# Patient Record
Sex: Female | Born: 1950
Health system: Southern US, Community
[De-identification: ages and names within clinical notes are randomized; demographics above are authoritative.]

## PROBLEM LIST (undated history)

## (undated) DIAGNOSIS — Z87442 Personal history of urinary calculi: Secondary | ICD-10-CM

## (undated) DIAGNOSIS — I10 Essential (primary) hypertension: Secondary | ICD-10-CM

## (undated) DIAGNOSIS — G43909 Migraine, unspecified, not intractable, without status migrainosus: Secondary | ICD-10-CM

## (undated) DIAGNOSIS — K8020 Calculus of gallbladder without cholecystitis without obstruction: Secondary | ICD-10-CM

## (undated) DIAGNOSIS — M1990 Unspecified osteoarthritis, unspecified site: Secondary | ICD-10-CM

## (undated) DIAGNOSIS — J189 Pneumonia, unspecified organism: Secondary | ICD-10-CM

## (undated) HISTORY — PX: TUBAL LIGATION: SHX77

## (undated) HISTORY — PX: KIDNEY STONE SURGERY: SHX686

## (undated) HISTORY — DX: Essential (primary) hypertension: I10

## (undated) HISTORY — PX: OTHER SURGICAL HISTORY: SHX169

---

## 2007-07-30 ENCOUNTER — Emergency Department (HOSPITAL_COMMUNITY): Admission: EM | Admit: 2007-07-30 | Discharge: 2007-07-30 | Payer: Self-pay | Admitting: Emergency Medicine

## 2008-02-11 ENCOUNTER — Emergency Department (HOSPITAL_COMMUNITY): Admission: EM | Admit: 2008-02-11 | Discharge: 2008-02-11 | Payer: Self-pay | Admitting: Family Medicine

## 2008-02-11 LAB — CONVERTED CEMR LAB
Bilirubin Urine: NEGATIVE
Nitrite: NEGATIVE
Protein, ur: 100 mg/dL
Specific Gravity, Urine: 1.01
Urobilinogen, UA: 0.2

## 2008-02-18 LAB — CONVERTED CEMR LAB
BUN: 10 mg/dL
Glucose, Bld: 92 mg/dL

## 2008-02-21 ENCOUNTER — Ambulatory Visit: Payer: Self-pay | Admitting: Nurse Practitioner

## 2008-02-21 DIAGNOSIS — R319 Hematuria, unspecified: Secondary | ICD-10-CM | POA: Insufficient documentation

## 2008-02-21 LAB — CONVERTED CEMR LAB
Basophils Absolute: 0.2 10*3/uL
Eosinophils Absolute: 0.5 10*3/uL
Eosinophils Relative: 5 %
Glucose, Urine, Semiquant: NEGATIVE
HCT: 39.2 %
Hemoglobin: 12.9 g/dL
Lymphocytes Relative: 26 %
MCV: 85.1 fL
Monocytes Absolute: 0.7 10*3/uL
Nitrite: NEGATIVE
Platelets: 414 10*3/uL
Protein, U semiquant: 30
RDW: 13.2 %
Urobilinogen, UA: 0.2
WBC Urine, dipstick: NEGATIVE

## 2008-02-22 ENCOUNTER — Encounter (INDEPENDENT_AMBULATORY_CARE_PROVIDER_SITE_OTHER): Payer: Self-pay | Admitting: Nurse Practitioner

## 2008-02-22 ENCOUNTER — Ambulatory Visit: Payer: Self-pay | Admitting: *Deleted

## 2008-02-23 ENCOUNTER — Ambulatory Visit (HOSPITAL_COMMUNITY): Admission: RE | Admit: 2008-02-23 | Discharge: 2008-02-23 | Payer: Self-pay | Admitting: Internal Medicine

## 2008-03-19 ENCOUNTER — Ambulatory Visit: Payer: Self-pay | Admitting: Nurse Practitioner

## 2008-03-19 DIAGNOSIS — N200 Calculus of kidney: Secondary | ICD-10-CM | POA: Insufficient documentation

## 2008-03-19 DIAGNOSIS — N959 Unspecified menopausal and perimenopausal disorder: Secondary | ICD-10-CM | POA: Insufficient documentation

## 2008-03-19 LAB — CONVERTED CEMR LAB
ALT: 11 units/L (ref 0–35)
BUN: 19 mg/dL (ref 6–23)
Bilirubin Urine: NEGATIVE
CO2: 25 meq/L (ref 19–32)
Calcium: 9.9 mg/dL (ref 8.4–10.5)
Chlamydia, DNA Probe: NEGATIVE
Chloride: 104 meq/L (ref 96–112)
Creatinine, Ser: 0.79 mg/dL (ref 0.40–1.20)
Eosinophils Relative: 4 % (ref 0–5)
GC Probe Amp, Genital: NEGATIVE
Glucose, Bld: 73 mg/dL (ref 70–99)
Glucose, Urine, Semiquant: NEGATIVE
HCT: 38.7 % (ref 36.0–46.0)
Hemoglobin: 12.8 g/dL (ref 12.0–15.0)
Ketones, urine, test strip: NEGATIVE
Lymphocytes Relative: 27 % (ref 12–46)
Lymphs Abs: 2.6 10*3/uL (ref 0.7–4.0)
Monocytes Absolute: 0.6 10*3/uL (ref 0.1–1.0)
Monocytes Relative: 6 % (ref 3–12)
Neutro Abs: 6 10*3/uL (ref 1.7–7.7)
Nitrite: NEGATIVE
Protein, U semiquant: 30
RBC: 4.68 M/uL (ref 3.87–5.11)
TSH: 1.643 microintl units/mL (ref 0.350–5.50)
Total Bilirubin: 0.3 mg/dL (ref 0.3–1.2)
Uric Acid, Serum: 3.8 mg/dL (ref 2.4–7.0)

## 2008-03-20 LAB — CONVERTED CEMR LAB: Pap Smear: NEGATIVE

## 2008-03-22 ENCOUNTER — Ambulatory Visit (HOSPITAL_COMMUNITY): Admission: RE | Admit: 2008-03-22 | Discharge: 2008-03-22 | Payer: Self-pay | Admitting: Internal Medicine

## 2008-03-23 ENCOUNTER — Encounter (INDEPENDENT_AMBULATORY_CARE_PROVIDER_SITE_OTHER): Payer: Self-pay | Admitting: Nurse Practitioner

## 2008-03-23 LAB — CONVERTED CEMR LAB

## 2008-03-27 ENCOUNTER — Encounter (INDEPENDENT_AMBULATORY_CARE_PROVIDER_SITE_OTHER): Payer: Self-pay | Admitting: Nurse Practitioner

## 2008-04-20 ENCOUNTER — Ambulatory Visit: Payer: Self-pay | Admitting: Nurse Practitioner

## 2008-04-20 DIAGNOSIS — N63 Unspecified lump in unspecified breast: Secondary | ICD-10-CM | POA: Insufficient documentation

## 2008-04-20 DIAGNOSIS — M949 Disorder of cartilage, unspecified: Secondary | ICD-10-CM

## 2008-04-20 DIAGNOSIS — M899 Disorder of bone, unspecified: Secondary | ICD-10-CM | POA: Insufficient documentation

## 2008-04-25 ENCOUNTER — Encounter: Admission: RE | Admit: 2008-04-25 | Discharge: 2008-04-25 | Payer: Self-pay | Admitting: Internal Medicine

## 2009-05-22 ENCOUNTER — Ambulatory Visit: Payer: Self-pay | Admitting: Nurse Practitioner

## 2009-05-22 DIAGNOSIS — F17200 Nicotine dependence, unspecified, uncomplicated: Secondary | ICD-10-CM

## 2009-05-22 DIAGNOSIS — Z87891 Personal history of nicotine dependence: Secondary | ICD-10-CM | POA: Insufficient documentation

## 2009-08-29 ENCOUNTER — Encounter (INDEPENDENT_AMBULATORY_CARE_PROVIDER_SITE_OTHER): Payer: Self-pay | Admitting: Nurse Practitioner

## 2009-08-29 ENCOUNTER — Ambulatory Visit: Payer: Self-pay | Admitting: Nurse Practitioner

## 2009-08-29 LAB — CONVERTED CEMR LAB
BUN: 15 mg/dL (ref 6–23)
Bilirubin Urine: NEGATIVE
CO2: 27 meq/L (ref 19–32)
Calcium: 9.6 mg/dL (ref 8.4–10.5)
Chloride: 103 meq/L (ref 96–112)
Creatinine, Ser: 0.89 mg/dL (ref 0.40–1.20)
Eosinophils Absolute: 0.4 10*3/uL (ref 0.0–0.7)
Eosinophils Relative: 4 % (ref 0–5)
HCT: 37.6 % (ref 36.0–46.0)
Lymphs Abs: 2.2 10*3/uL (ref 0.7–4.0)
MCHC: 32.7 g/dL (ref 30.0–36.0)
MCV: 83.2 fL (ref 78.0–100.0)
Microalb, Ur: 0.98 mg/dL (ref 0.00–1.89)
Monocytes Relative: 6 % (ref 3–12)
Platelets: 388 10*3/uL (ref 150–400)
RBC: 4.52 M/uL (ref 3.87–5.11)
TSH: 1.178 microintl units/mL (ref 0.350–4.500)
Urobilinogen, UA: 1
WBC: 9.9 10*3/uL (ref 4.0–10.5)
pH: 7

## 2009-09-04 ENCOUNTER — Encounter (INDEPENDENT_AMBULATORY_CARE_PROVIDER_SITE_OTHER): Payer: Self-pay | Admitting: Nurse Practitioner

## 2009-09-06 ENCOUNTER — Ambulatory Visit (HOSPITAL_COMMUNITY): Admission: RE | Admit: 2009-09-06 | Discharge: 2009-09-06 | Payer: Self-pay | Admitting: Internal Medicine

## 2009-09-12 ENCOUNTER — Ambulatory Visit: Payer: Self-pay | Admitting: Nurse Practitioner

## 2009-09-13 ENCOUNTER — Encounter (INDEPENDENT_AMBULATORY_CARE_PROVIDER_SITE_OTHER): Payer: Self-pay | Admitting: Nurse Practitioner

## 2009-09-13 DIAGNOSIS — E7800 Pure hypercholesterolemia, unspecified: Secondary | ICD-10-CM | POA: Insufficient documentation

## 2010-09-09 ENCOUNTER — Ambulatory Visit (HOSPITAL_COMMUNITY): Admission: RE | Admit: 2010-09-09 | Discharge: 2010-09-09 | Payer: Self-pay | Admitting: Internal Medicine

## 2011-01-18 ENCOUNTER — Encounter: Payer: Self-pay | Admitting: Internal Medicine

## 2011-09-02 ENCOUNTER — Other Ambulatory Visit (HOSPITAL_COMMUNITY): Payer: Self-pay | Admitting: Family Medicine

## 2011-09-02 DIAGNOSIS — Z1231 Encounter for screening mammogram for malignant neoplasm of breast: Secondary | ICD-10-CM

## 2011-09-14 ENCOUNTER — Ambulatory Visit (HOSPITAL_COMMUNITY)
Admission: RE | Admit: 2011-09-14 | Discharge: 2011-09-14 | Disposition: A | Discharge status: Home / Self Care | Payer: Self-pay | Source: Ambulatory Visit | Attending: Family Medicine | Admitting: Family Medicine

## 2011-09-14 DIAGNOSIS — Z1231 Encounter for screening mammogram for malignant neoplasm of breast: Secondary | ICD-10-CM | POA: Insufficient documentation

## 2011-09-18 LAB — POCT I-STAT CREATININE: Creatinine, Ser: 0.9

## 2011-09-18 LAB — DIFFERENTIAL
Basophils Relative: 2 — ABNORMAL HIGH
Eosinophils Absolute: 0.5
Eosinophils Relative: 5
Lymphs Abs: 2.7
Monocytes Absolute: 0.7
Monocytes Relative: 7

## 2011-09-18 LAB — POCT URINALYSIS DIP (DEVICE)
Glucose, UA: NEGATIVE
Specific Gravity, Urine: 1.01
Urobilinogen, UA: 0.2

## 2011-09-18 LAB — I-STAT 8, (EC8 V) (CONVERTED LAB)
BUN: 10
Bicarbonate: 26.5 — ABNORMAL HIGH
Glucose, Bld: 92
TCO2: 28
pH, Ven: 7.403 — ABNORMAL HIGH

## 2011-09-18 LAB — CBC
HCT: 39.2
Hemoglobin: 12.9
MCHC: 33
MCV: 85.1
RBC: 4.6

## 2011-10-12 LAB — URINALYSIS, ROUTINE W REFLEX MICROSCOPIC
Bilirubin Urine: NEGATIVE
Ketones, ur: 15 — AB
Nitrite: NEGATIVE
Protein, ur: 100 — AB
Specific Gravity, Urine: 1.018
Urobilinogen, UA: 0.2

## 2011-10-12 LAB — DIFFERENTIAL
Basophils Absolute: 0.4 — ABNORMAL HIGH
Basophils Relative: 2 — ABNORMAL HIGH
Eosinophils Relative: 6 — ABNORMAL HIGH
Lymphocytes Relative: 11 — ABNORMAL LOW
Monocytes Absolute: 0.8 — ABNORMAL HIGH

## 2011-10-12 LAB — CBC
HCT: 47.3 — ABNORMAL HIGH
MCV: 85.6
Platelets: 467 — ABNORMAL HIGH
RBC: 5.53 — ABNORMAL HIGH
WBC: 17.3 — ABNORMAL HIGH

## 2011-10-12 LAB — COMPREHENSIVE METABOLIC PANEL
AST: 16
Albumin: 4.2
Alkaline Phosphatase: 66
BUN: 10
CO2: 28
Chloride: 98
Creatinine, Ser: 0.79
GFR calc Af Amer: >60
GFR calc non Af Amer: >60
Potassium: 3.5
Total Bilirubin: 0.8

## 2011-10-12 LAB — LIPASE, BLOOD: Lipase: 18

## 2012-10-27 ENCOUNTER — Encounter: Payer: Self-pay | Admitting: *Deleted

## 2013-08-10 ENCOUNTER — Emergency Department (HOSPITAL_COMMUNITY)
Admission: EM | Admit: 2013-08-10 | Discharge: 2013-08-10 | Disposition: A | Discharge status: Home / Self Care | Payer: BC Managed Care – PPO | Attending: Emergency Medicine | Admitting: Emergency Medicine

## 2013-08-10 ENCOUNTER — Encounter (HOSPITAL_COMMUNITY): Payer: Self-pay

## 2013-08-10 DIAGNOSIS — Z8719 Personal history of other diseases of the digestive system: Secondary | ICD-10-CM | POA: Insufficient documentation

## 2013-08-10 DIAGNOSIS — F17200 Nicotine dependence, unspecified, uncomplicated: Secondary | ICD-10-CM | POA: Insufficient documentation

## 2013-08-10 DIAGNOSIS — R200 Anesthesia of skin: Secondary | ICD-10-CM

## 2013-08-10 DIAGNOSIS — R209 Unspecified disturbances of skin sensation: Secondary | ICD-10-CM | POA: Insufficient documentation

## 2013-08-10 HISTORY — DX: Calculus of gallbladder without cholecystitis without obstruction: K80.20

## 2013-08-10 LAB — POCT I-STAT, CHEM 8
BUN: 8 mg/dL (ref 6–23)
Calcium, Ion: 1.07 mmol/L — ABNORMAL LOW (ref 1.13–1.30)
Chloride: 108 mEq/L (ref 96–112)
Creatinine, Ser: 0.9 mg/dL (ref 0.50–1.10)
Glucose, Bld: 89 mg/dL (ref 70–99)
TCO2: 24 mmol/L (ref 0–100)

## 2013-08-10 MED ORDER — ASPIRIN 81 MG PO CHEW: 81.0000 mg | CHEWABLE_TABLET | Freq: Every day | ORAL | Status: DC

## 2013-08-10 NOTE — ED Notes (Signed)
Patient presents to ED via POV. Pt c/o of right sided "numbness" that comes and goes for approx 1 week. Pt states that her right arm and leg go numb for approx 20 minutes and then the symptoms resolve. Pt states that she she has not experienced the symptoms everyday. Pt A&Ox4 upon arrival to ED. No facial droop noted. No extremity drift noted. Speech clear. Pt not experiencing any symptoms at this time.

## 2013-08-10 NOTE — ED Notes (Signed)
Janet Client, PA back in to speak with the patient.

## 2013-08-10 NOTE — ED Provider Notes (Signed)
Medical screening examination/treatment/procedure(s) were performed by non-physician practitioner and as supervising physician I was immediately available for consultation/collaboration.  Olivia Mackie, MD 08/10/13 [phone]

## 2013-08-10 NOTE — ED Provider Notes (Signed)
CSN: [id number]     Arrival date & time 08/10/13  0604 History     First MD Initiated Contact with Patient 08/10/13 [phone]     Chief Complaint  Patient presents with  . Numbness   (Consider location/radiation/quality/duration/timing/severity/associated sxs/prior Treatment) HPI Comments: Patient is a 62 year old female who presents today with one week of intermittent numbness to her right upper and lower extremity. She reports that initially when it began it lasted about 30 minutes at a time, but yesterday it lasted nearly all day prompting her to come to the emergency department this morning. She believes that stress "doing too much" make her numbness worse. The numbness is self-limited and nothing appears to make the numbness better. These episodes are not associated with weakness, shortness of breath, diaphoresis, chest pain, headache. She has never had numbness like this in the past.  The history is provided by the patient. No language interpreter was used.    Past Medical History  Diagnosis Date  . Gallstones    History reviewed. No pertinent past surgical history. No family history on file. History  Substance Use Topics  . Smoking status: Current Every Day Smoker -- 1.00 packs/day    Types: Cigarettes  . Smokeless tobacco: Not on file  . Alcohol Use: No   OB History   Grav Para Term Preterm Abortions TAB SAB Ect Mult Living                 Review of Systems  Constitutional: Negative for fever and chills.  Respiratory: Negative for shortness of breath.   Cardiovascular: Negative for chest pain.  Neurological: Positive for numbness. Negative for syncope, speech difficulty, weakness and headaches.  All other systems reviewed and are negative.    Allergies  Review of patient's allergies indicates no known allergies.  Home Medications  No current outpatient prescriptions on file. BP 144/80  Pulse 75  Temp(Src) 98.1 F (36.7 C) (Oral)  Resp 18  Ht 5\' 5"  (1.651 m)   Wt 130 lb (58.968 kg)  BMI 21.63 kg/m2  SpO2 100% Physical Exam  Nursing note and vitals reviewed. Constitutional: She is oriented to person, place, and time. She appears well-developed and well-nourished. No distress.  HENT:  Head: Normocephalic and atraumatic.  Right Ear: External ear normal.  Left Ear: External ear normal.  Nose: Nose normal.  Mouth/Throat: Oropharynx is clear and moist.  Eyes: Conjunctivae and EOM are normal. Pupils are equal, round, and reactive to light.  No facial droop  Neck: Normal range of motion.  Cardiovascular: Normal rate, regular rhythm, normal heart sounds, intact distal pulses and normal pulses.   Pulmonary/Chest: Effort normal and breath sounds normal. No stridor. No respiratory distress. She has no wheezes. She has no rales.  Abdominal: Soft. She exhibits no distension.  Musculoskeletal: Normal range of motion.  Neurological: She is alert and oriented to person, place, and time. She has normal strength and normal reflexes. No sensory deficit. Coordination and gait normal.  Heel shin toe normal. Finger-nose-finger normal. Strength 5/5 in all extremities.  Skin: Skin is warm and dry. She is not diaphoretic. No erythema.  Psychiatric: She has a normal mood and affect. Her behavior is normal.    ED Course   Procedures (including critical care time)  Labs Reviewed  POCT I-STAT, CHEM 8 - Abnormal; Notable for the following:    Calcium, Ion 1.07 (*)    All other components within normal limits   No results found. 1. Numbness  MDM  Patient with intermittent right-sided numbness for the past week. Neuro exam is within normal limits, no focal deficits. Patient is able to ambulate without difficulty. Patient is asymptomatic at this time. Plan is for patient to begin a daily ASA and follow up with neurology. Discussed this case with Dr. Norlene Campbell agrees with plan. Return instructions given. Vital signs stable for discharge. Patient / Family / Caregiver  informed of clinical course, understand medical decision-making process, and agree with plan.   Mora Bellman, PA-C 08/10/13 [phone]

## 2013-08-22 ENCOUNTER — Encounter: Payer: Self-pay | Admitting: Neurology

## 2013-08-22 ENCOUNTER — Ambulatory Visit (INDEPENDENT_AMBULATORY_CARE_PROVIDER_SITE_OTHER): Payer: BC Managed Care – PPO | Admitting: Neurology

## 2013-08-22 VITALS — BP 120/75 | HR 73 | Ht 65.0 in | Wt 117.0 lb

## 2013-08-22 DIAGNOSIS — R29818 Other symptoms and signs involving the nervous system: Secondary | ICD-10-CM

## 2013-08-22 NOTE — Patient Instructions (Signed)
Overall you are doing fairly well but I do want to suggest a few things today:   Remember to drink plenty of fluid, eat healthy meals and do not skip any meals. Try to eat protein with a every meal and eat a healthy snack such as fruit or nuts in between meals. Try to keep a regular sleep-wake schedule and try to exercise daily, particularly in the form of walking, 20-30 minutes a day, if you can.   As far as your medications are concerned, I would like to suggest you continue on the aspirin 81mg  daily  As far as diagnostic testing: I would like to get a MRI of the brain to check for a stroke  I would like to see you back in 4 to 6 months, sooner if we need to. Please call us with any interim questions, concerns, problems, updates or refill requests.   Please also call us for any test results so we can go over those with you on the phone.  My clinical assistant and will answer any of your questions and relay your messages to me and also relay most of my messages to you.   Our phone number is [phone]. We also have an after hours call service for urgent matters and there is a physician on-call for urgent questions. For any emergencies you know to call 911 or go to the nearest emergency room

## 2013-08-22 NOTE — Progress Notes (Signed)
[facility]  Provider:  Dr Hosie Poisson Referring Provider: Olivia Mackie, MD Primary Care Physician:  No PCP Per Patient  CC: right sided numbness  HPI:  Janet Sherman is a 62 y.o. female here as a referral from Dr. Norlene Campbell for evaluation of right sided numbness  She reports symptoms started around one month ago. At that time she noted the acute onset of numbness and pins and needles type sensation around her lips on the right side and also in her fingers, this eventually spread to involve her feet and the whole right side. As the progression also noted difficulty holding onto things with the right hand, some weakness in the right lower extremity and difficulty walking. She treated these symptoms to being stressed and annoyed as she reports her family was upsetting her. She eventually went to the event that point the symptoms had mostly resolved. She continues to have some right-sided numbness and paresthesias but the weakness has resolved. She has no prior history of this. Denies any difficulty walking or not. No prior history of stroke or TIA. In the ER started on aspirin 81 mg daily.  Denies any DM, HTN, HLD. No EtOH, smokes cigarettes. Denies any history of vision change painful vision loss.  Review of Systems: Out of a complete 14 system review, the patient complains of only the following symptoms, and all other reviewed systems are negative. Positive for numbness and weakness on the right side  History   Social History  . Marital Status: Married    Spouse Name: N/A    Number of Children: 5  . Years of Education: 9   Occupational History  .      [address]   Social History Main Topics  . Smoking status: Current Every Day Smoker -- 10.00 packs/day    Types: Cigarettes  . Smokeless tobacco: Never Used  . Alcohol Use: No  . Drug Use: No  . Sexual Activity: Not on file   Other Topics Concern  . Not on file   Social History Narrative   Patient  consumes no caffeine, is right handed.    Family History  Problem Relation Age of Onset  . Hypertension Mother   . Cancer Father     lung  . Hypertension Sister   . Hypertension Brother     Past Medical History  Diagnosis Date  . Gallstones     Past Surgical History  Procedure Laterality Date  . Gallstones      15 years ago    Current Outpatient Prescriptions  Medication Sig Dispense Refill  . aspirin 81 MG chewable tablet Chew 1 tablet (81 mg total) by mouth daily.  30 tablet  0  . CALCIUM PO Take 1 tablet by mouth daily.      Marland Kitchen VITAMIN E PO Take 1 capsule by mouth daily.       No current facility-administered medications for this visit.    Allergies as of 08/22/2013  . (No Known Allergies)    Vitals: BP 120/75  Pulse 73  Ht 5\' 5"  (1.651 m)  Wt 117 lb (53.071 kg)  BMI 19.47 kg/m2 Last Weight:  Wt Readings from Last 1 Encounters:  08/22/13 117 lb (53.071 kg)   Last Height:   Ht Readings from Last 1 Encounters:  08/22/13 5\' 5"  (1.651 m)     Physical exam: Exam: Gen: NAD, conversant Eyes: anicteric sclerae, moist conjunctivae HENT: Atraumati Neck: Trachea midline; supple,  Lungs: CTA, no wheezing, rales, rhonic  CV: RRR, no MRG Abdomen: Soft, non-tender;  Extremities: No peripheral edema  Skin: Normal temperature, no rash,  Psych: Appropriate affect, pleasant  Neuro: MS: AA&Ox3, appropriately interactive, normal affect   Speech: fluent w/o paraphasic error  Memory: good recent and remote recall  CN: PERRL, EOMI no nystagmus, no ptosis, sensation intact to LT V1-V3 bilat, face symmetric, no weakness, hearing grossly intact, palate elevates symmetrically, shoulder shrug 5/5 bilat,  tongue protrudes midline, no fasiculations noted.  Motor: normal bulk and tone Strength: 5/5  In all extremities  Coord: rapid alternating and point-to-point (FNF, HTS) movements intact.  Reflexes: symmetrical, bilat downgoing  toes  Sens: Light touch intact in all extremities, though light touch of the right hand trigger sensation or paresthesias. Intact pinprick temperature vibration proprioception in all service. 2. tactile discrimination intact.  Gait: posture, stance, stride and arm-swing normal. Tandem gait intact.Romberg absent.   Assessment:  After physical and neurologic examination, review of laboratory studies, imaging, neurophysiology testing and pre-existing records, assessment will be reviewed on the problem list.  Plan:  Treatment plan and additional workup will be reviewed under Problem List.  [name] is a pleasant 62 year old woman presents for initial evaluation of episode of hemisensory loss with mild weakness and paresthesias. The sensory loss and paresthesias continue the weakness has resolved per the patient. The patients clinical description of the events and history are concerning for a stroke, most likely a small lacunar infarct based on symptoms. This concern was discussed with the patient, who expresses understanding. We discussed different diagnostic and therapeutic options. Will order brain MRI to check for stroke. Instructed patient to continue on aspirin 81 mg daily. Pending results of MRI will consider further stroke workup. Can consider addition of Lyrica or Neurontin for symptomatic relief of paresthesias if patient interested in the future.  1)Hemisensory loss: r/o stroke  -continue ASA 81mg  daily -MRI of brain -if + for stroke will check 2D echo, lipids, HbA1c, consider starting statin -follow up once MRI completed

## 2013-08-24 ENCOUNTER — Ambulatory Visit (INDEPENDENT_AMBULATORY_CARE_PROVIDER_SITE_OTHER): Payer: BC Managed Care – PPO

## 2013-08-24 DIAGNOSIS — R29818 Other symptoms and signs involving the nervous system: Secondary | ICD-10-CM

## 2013-08-29 ENCOUNTER — Telehealth: Payer: Self-pay

## 2013-08-29 NOTE — Telephone Encounter (Signed)
Message copied by [facility] on Tue Aug 29, 2013  1:58 PM ------      Message from: Ramond Marrow      Created: Tue Aug 29, 2013 10:29 AM       Please let her know the MRI was normal. Thanks. ------

## 2013-08-29 NOTE — Telephone Encounter (Signed)
I called [employer] and spoke with her husband. I let him know that the results of the MRI were normal.

## 2013-09-06 ENCOUNTER — Telehealth: Payer: Self-pay | Admitting: Neurology

## 2013-09-06 ENCOUNTER — Other Ambulatory Visit: Payer: Self-pay | Admitting: Neurology

## 2013-09-06 MED ORDER — PREGABALIN 75 MG PO CAPS: 75.0000 mg | ORAL_CAPSULE | Freq: Two times a day (BID) | ORAL | Status: DC

## 2013-09-06 NOTE — Telephone Encounter (Signed)
I called patient and let her know her MRI was normal. She states that she has numbness on right side from lip down to toes. Patient stated that it feels like she has a blister and numbness on the inside but you can't the blister. Patient is wondering if you can order her something that can help with this numbness. She needs a call back before 2:00 p.m. on Thursday. Her phone number is: [phone].   Thank you.

## 2013-09-06 NOTE — Telephone Encounter (Signed)
Called patient. Started Lyrica 75mg  bid

## 2013-09-06 NOTE — Telephone Encounter (Signed)
Mrs. Janet Sherman, looks like you gave patient husband MRI results, but now she is calling wanting the results. Can you please call patient and talk with her?

## 2013-12-25 ENCOUNTER — Encounter (INDEPENDENT_AMBULATORY_CARE_PROVIDER_SITE_OTHER): Payer: Self-pay

## 2013-12-25 ENCOUNTER — Encounter: Payer: Self-pay | Admitting: Neurology

## 2013-12-25 ENCOUNTER — Ambulatory Visit (INDEPENDENT_AMBULATORY_CARE_PROVIDER_SITE_OTHER): Payer: BC Managed Care – PPO | Admitting: Neurology

## 2013-12-25 VITALS — BP 132/79 | HR 86 | Ht 66.0 in | Wt 123.0 lb

## 2013-12-25 DIAGNOSIS — R209 Unspecified disturbances of skin sensation: Secondary | ICD-10-CM

## 2013-12-25 DIAGNOSIS — R202 Paresthesia of skin: Secondary | ICD-10-CM

## 2013-12-25 NOTE — Progress Notes (Signed)
[facility]  Provider:  Dr Hosie Poisson Referring Provider: No ref. provider found Primary Care Physician:  No PCP Per Patient  CC: right sided numbness  HPI:  Janet Sherman is a 62 y.o. female here as a follow up for evaluation of right sided numbness. Since last visit (07/2013) she has had a brain MRI which was unremarkable. She was started on Lyrica 75mg  twice a day for symptomatic relief.   Notes symptoms are slowly improving. Continues to have mild paresthesias of right side which come and go. No weakness, no vision changes. She continues to feel the symptoms are triggered by stress/anxiety.    Initial visit 07/2013: She reports symptoms started around one month ago. At that time she noted the acute onset of numbness and pins and needles type sensation around her lips on the right side and also in her fingers, this eventually spread to involve her feet and the whole right side. As the progression also noted difficulty holding onto things with the right hand, some weakness in the right lower extremity and difficulty walking. She treated these symptoms to being stressed and annoyed as she reports her family was upsetting her. She eventually went to the event that point the symptoms had mostly resolved. She continues to have some right-sided numbness and paresthesias but the weakness has resolved. She has no prior history of this. Denies any difficulty walking or not. No prior history of stroke or TIA. In the ER started on aspirin 81 mg daily.  Denies any DM, HTN, HLD. No EtOH, smokes cigarettes. Denies any history of vision change painful vision loss.  Review of Systems: Out of a complete 14 system review, the patient complains of only the following symptoms, and all other reviewed systems are negative. Positive for paresthesias  History   Social History  . Marital Status: Married    Spouse Name: Janet Sherman    Number of Children: 5  . Years of Education: 10   Occupational  History  .      [address]   Social History Main Topics  . Smoking status: Current Every Day Smoker -- 10.00 packs/day    Types: Cigarettes  . Smokeless tobacco: Never Used  . Alcohol Use: No  . Drug Use: No  . Sexual Activity: Not on file   Other Topics Concern  . Not on file   Social History Narrative   Patient is married(James) and lives at home with her husband.   Patient has 5 children.   Patient works full-time.   Patient has a 10th  Grade education.   Patient is right handed.   Patient drinks 2-3 cups of coffee daily.    Family History  Problem Relation Age of Onset  . Hypertension Mother   . Cancer Father     lung  . Hypertension Sister   . Hypertension Brother     Past Medical History  Diagnosis Date  . Gallstones     Past Surgical History  Procedure Laterality Date  . Gallstones      15 years ago    Current Outpatient Prescriptions  Medication Sig Dispense Refill  . aspirin 81 MG chewable tablet Chew 1 tablet (81 mg total) by mouth daily.  30 tablet  0  . CALCIUM PO Take 1 tablet by mouth daily.      . pregabalin (LYRICA) 75 MG capsule Take 1 capsule (75 mg total) by mouth 2 (two) times daily.  60 capsule  3  . VITAMIN E  PO Take 1 capsule by mouth daily.       No current facility-administered medications for this visit.    Allergies as of 12/25/2013  . (No Known Allergies)    Vitals: BP 132/79  Pulse 86  Ht 5\' 6"  (1.676 m)  Wt 123 lb (55.792 kg)  BMI 19.86 kg/m2 Last Weight:  Wt Readings from Last 1 Encounters:  12/25/13 123 lb (55.792 kg)   Last Height:   Ht Readings from Last 1 Encounters:  12/25/13 5\' 6"  (1.676 m)     Physical exam: Exam: Gen: NAD, conversant Eyes: anicteric sclerae, moist conjunctivae HENT: Atraumati Neck: Trachea midline; supple,  Lungs: CTA, no wheezing, rales, rhonic                          CV: RRR, no MRG Abdomen: Soft, non-tender;  Extremities: No peripheral edema  Skin: Normal  temperature, no rash,  Psych: Appropriate affect, pleasant  Neuro: MS: AA&Ox3, appropriately interactive, normal affect   Speech: fluent w/o paraphasic error  Memory: good recent and remote recall  CN: PERRL, EOMI no nystagmus, no ptosis, sensation intact to LT V1-V3 bilat, face symmetric, no weakness, hearing grossly intact, palate elevates symmetrically, shoulder shrug 5/5 bilat,  tongue protrudes midline, no fasiculations noted.  Motor: normal bulk and tone Strength: 5/5  In all extremities  Coord: rapid alternating and point-to-point (FNF, HTS) movements intact.  Reflexes: symmetrical, bilat downgoing toes  Sens: Light touch intact in all extremities, though light touch of the right hand trigger sensation or paresthesias. Intact pinprick temperature vibration proprioception in all service. 2. tactile discrimination intact.  Gait: posture, stance, stride and arm-swing normal. Tandem gait intact.Romberg absent.   Assessment:  After physical and neurologic examination, review of laboratory studies, imaging, neurophysiology testing and pre-existing records, assessment will be reviewed on the problem list.  Plan:  Treatment plan and additional workup will be reviewed under Problem List.  1)Paresthesias  [name] is a pleasant 62 year old woman presents for followup evaluation of right-sided paresthesias. Unclear etiology, patient does note the symptoms are triggered by stress anxiety. Counseled her on importance of stress related working 2 reduce stress and anxiety. We'll continue Lyrica 75 mg twice a day for symptomatic relief. Would hold off on further imaging and workup at this time if symptoms appear to be resolving. Followup in 6 months or earlier as needed.

## 2013-12-25 NOTE — Patient Instructions (Signed)
Overall you are doing fairly well but I do want to suggest a few things today:   Remember to drink plenty of fluid, eat healthy meals and do not skip any meals. Try to eat protein with a every meal and eat a healthy snack such as fruit or nuts in between meals. Try to keep a regular sleep-wake schedule and try to exercise daily, particularly in the form of walking, 20-30 minutes a day, if you can.   As far as your medications are concerned, I would like to suggest continuing on the Lyrica 75mg  twice a day.   I would like to see you back in 6 months, sooner if we need to. Please call us with any interim questions, concerns, problems, updates or refill requests.   Please also call us for any test results so we can go over those with you on the phone.  My clinical assistant and will answer any of your questions and relay your messages to me and also relay most of my messages to you.   Our phone number is [phone]. We also have an after hours call service for urgent matters and there is a physician on-call for urgent questions. For any emergencies you know to call 911 or go to the nearest emergency room

## 2014-06-14 ENCOUNTER — Telehealth: Payer: Self-pay | Admitting: *Deleted

## 2014-06-14 NOTE — Telephone Encounter (Signed)
Spoke with patient's son who gave me patient's work number, patient appointment was r/s to 07/09/14 at 2 pm.

## 2014-06-25 ENCOUNTER — Ambulatory Visit: Payer: BC Managed Care – PPO | Admitting: Neurology

## 2014-06-27 ENCOUNTER — Telehealth: Payer: Self-pay | Admitting: *Deleted

## 2014-06-27 NOTE — Telephone Encounter (Signed)
Called patient cell and left a message that her appointment scheduled for 07-09-14 has been canceled. Please call the office to r/s due to Janet Sherman being out of the office.

## 2014-07-03 ENCOUNTER — Encounter: Payer: Self-pay | Admitting: Neurology

## 2014-07-03 ENCOUNTER — Telehealth: Payer: Self-pay | Admitting: Neurology

## 2014-07-03 NOTE — Telephone Encounter (Signed)
Spoke with patient's husband regarding rescheduling 07/09/14 appointment per Dr. Hazle Quant schedule, husband verbalized understanding. Printed and mailed letter with new appointment time.

## 2014-07-09 ENCOUNTER — Ambulatory Visit: Payer: Self-pay | Admitting: Neurology

## 2014-08-07 ENCOUNTER — Ambulatory Visit: Payer: Self-pay | Admitting: Neurology

## 2015-10-23 NOTE — Telephone Encounter (Signed)
Error

## 2016-07-28 ENCOUNTER — Encounter (HOSPITAL_COMMUNITY): Payer: Self-pay | Admitting: *Deleted

## 2016-07-28 ENCOUNTER — Emergency Department (HOSPITAL_COMMUNITY)
Admission: EM | Admit: 2016-07-28 | Discharge: 2016-07-28 | Disposition: A | Discharge status: Home / Self Care | Payer: BLUE CROSS/BLUE SHIELD | Attending: Emergency Medicine | Admitting: Emergency Medicine

## 2016-07-28 ENCOUNTER — Emergency Department (HOSPITAL_COMMUNITY): Payer: BLUE CROSS/BLUE SHIELD

## 2016-07-28 DIAGNOSIS — F17210 Nicotine dependence, cigarettes, uncomplicated: Secondary | ICD-10-CM | POA: Insufficient documentation

## 2016-07-28 DIAGNOSIS — E236 Other disorders of pituitary gland: Secondary | ICD-10-CM

## 2016-07-28 DIAGNOSIS — Z79899 Other long term (current) drug therapy: Secondary | ICD-10-CM | POA: Diagnosis not present

## 2016-07-28 DIAGNOSIS — E237 Disorder of pituitary gland, unspecified: Secondary | ICD-10-CM | POA: Insufficient documentation

## 2016-07-28 DIAGNOSIS — R51 Headache: Secondary | ICD-10-CM | POA: Diagnosis present

## 2016-07-28 LAB — URINALYSIS, ROUTINE W REFLEX MICROSCOPIC
BILIRUBIN URINE: NEGATIVE
GLUCOSE, UA: NEGATIVE mg/dL
HGB URINE DIPSTICK: NEGATIVE
KETONES UR: NEGATIVE mg/dL
Nitrite: NEGATIVE
PROTEIN: NEGATIVE mg/dL
Specific Gravity, Urine: 1.007 (ref 1.005–1.030)
pH: 7.5 (ref 5.0–8.0)

## 2016-07-28 LAB — BASIC METABOLIC PANEL
Anion gap: 6 (ref 5–15)
BUN: 11 mg/dL (ref 6–20)
CALCIUM: 9.4 mg/dL (ref 8.9–10.3)
CO2: 28 mmol/L (ref 22–32)
CREATININE: 0.84 mg/dL (ref 0.44–1.00)
Chloride: 107 mmol/L (ref 101–111)
GFR calc Af Amer: >60 mL/min (ref >60)
Glucose, Bld: 107 mg/dL — ABNORMAL HIGH (ref 65–99)
POTASSIUM: 4 mmol/L (ref 3.5–5.1)
SODIUM: 141 mmol/L (ref 135–145)

## 2016-07-28 LAB — CBC
HCT: 40.4 % (ref 36.0–46.0)
HEMOGLOBIN: 13.6 g/dL (ref 12.0–15.0)
MCH: 27.1 pg (ref 26.0–34.0)
MCHC: 33.7 g/dL (ref 30.0–36.0)
MCV: 80.5 fL (ref 78.0–100.0)
PLATELETS: 499 10*3/uL — AB (ref 150–400)
RBC: 5.02 MIL/uL (ref 3.87–5.11)
RDW: 13.4 % (ref 11.5–15.5)
WBC: 14.5 10*3/uL — ABNORMAL HIGH (ref 4.0–10.5)

## 2016-07-28 LAB — URINE MICROSCOPIC-ADD ON: RBC / HPF: NONE SEEN RBC/hpf (ref 0–5)

## 2016-07-28 LAB — CBG MONITORING, ED: GLUCOSE-CAPILLARY: 114 mg/dL — AB (ref 65–99)

## 2016-07-28 MED ORDER — KETOROLAC TROMETHAMINE 30 MG/ML IJ SOLN
30.0000 mg | Freq: Once | INTRAMUSCULAR | Status: AC
  Administered 2016-07-28: 30 mg via INTRAVENOUS
  Filled 2016-07-28: qty 1

## 2016-07-28 MED ORDER — SODIUM CHLORIDE 0.9 % IV BOLUS (SEPSIS)
500.0000 mL | Freq: Once | INTRAVENOUS | Status: AC
  Administered 2016-07-28: 500 mL via INTRAVENOUS

## 2016-07-28 MED ORDER — SODIUM CHLORIDE 0.9 % IV BOLUS (SEPSIS)
1000.0000 mL | Freq: Once | INTRAVENOUS | Status: AC
  Administered 2016-07-28: 1000 mL via INTRAVENOUS

## 2016-07-28 NOTE — ED Notes (Signed)
Waiting until patient get fluids to collect labs

## 2016-07-28 NOTE — ED Triage Notes (Signed)
Pt complains of headache, emesis, feeling faint for the past few days which became worse at work today. Pt denies diarrhea or abdominal pain

## 2016-07-28 NOTE — ED Provider Notes (Signed)
[facility] DEPT Provider Note   CSN: [id number] Arrival date & time: 07/28/16  1343  First Provider Contact:  First MD Initiated Contact with Patient 07/28/16 1452      History   Chief Complaint Chief Complaint  Patient presents with  . Migraine  . Emesis  . Weakness    HPI Janet Sherman is a 65 y.o. female.  The history is provided by the patient and medical records. No language interpreter was used.  Migraine  Associated symptoms include fatigue, headaches, vomiting and weakness. Pertinent negatives include no abdominal pain, chills, congestion, coughing, fever, nausea, neck pain or rash.  Emesis   Associated symptoms include vomiting and headaches. Pertinent negatives include no diarrhea, no congestion and no cough.  Weakness  Associated symptoms include fatigue, headaches, vomiting and weakness. Pertinent negatives include no abdominal pain, chills, congestion, coughing, fever, nausea, neck pain or rash.   Janet Sherman is a 65 y.o. female  with a PMH of HLD who presents to the Emergency Department complaining of intermittent frontal headache that gradually came on over the last week. No history of migraines or similar headaches. Associated symptoms include generalized weakness and fatigue for the last few days as well. Appetite as usual, but has not been drinking much fluids. One episode of emesis today, but denies abdominal pain and nausea at present. No recent illness. Denies shortness of breath, chest pain, back pain, fever, visual changes, slurred speech, changes in gait, muscles weakness, photophobia.    Past Medical History:  Diagnosis Date  . Gallstones     Patient Active Problem List   Diagnosis Date Noted  . HYPERCHOLESTEROLEMIA 09/13/2009  . TOBACCO ABUSE 05/22/2009  . SINUSITIS 05/22/2009  . BREAST MASS, LEFT 04/20/2008  . OSTEOPENIA 04/20/2008  . NEPHROLITHIASIS 03/19/2008  . POSTMENOPAUSAL SYNDROME 03/19/2008  . HEMATURIA UNSPECIFIED 02/21/2008     Past Surgical History:  Procedure Laterality Date  . gallstones     15 years ago    OB History    No data available       Home Medications    Prior to Admission medications   Medication Sig Start Date End Date Taking? Authorizing Provider  Multiple Vitamins-Minerals (MULTIVITAMIN WITH MINERALS) tablet Take 1 tablet by mouth daily.   Yes Historical Provider, MD  aspirin 81 MG chewable tablet Chew 1 tablet (81 mg total) by mouth daily. Patient not taking: Reported on 07/28/2016 08/10/13   Cleatrice Burke, PA-C  CALCIUM PO Take 1 tablet by mouth daily.    Historical Provider, MD  pregabalin (LYRICA) 75 MG capsule Take 1 capsule (75 mg total) by mouth 2 (two) times daily. Patient not taking: Reported on 07/28/2016 09/06/13   Drema Dallas, DO    Family History Family History  Problem Relation Age of Onset  . Hypertension Mother   . Cancer Father     lung  . Hypertension Sister   . Hypertension Brother     Social History Social History  Substance Use Topics  . Smoking status: Current Every Day Smoker    Packs/day: 10.00    Types: Cigarettes  . Smokeless tobacco: Never Used  . Alcohol use No     Allergies   Review of patient's allergies indicates no known allergies.   Review of Systems Review of Systems  Constitutional: Positive for fatigue. Negative for chills and fever.  HENT: Negative for congestion.   Eyes: Negative for visual disturbance.  Respiratory: Negative for cough and shortness of breath.   Cardiovascular: Negative.  Gastrointestinal: Positive for vomiting. Negative for abdominal pain, constipation, diarrhea and nausea.  Genitourinary: Negative for dysuria.  Musculoskeletal: Negative for back pain and neck pain.  Skin: Negative for color change and rash.  Neurological: Positive for weakness and headaches. Negative for dizziness, syncope and speech difficulty.     Physical Exam Updated Vital Signs BP 145/84   Pulse 63   Temp 98.8 F (37.1 C)  (Oral)   Resp 18   SpO2 99%   Physical Exam  Constitutional: She is oriented to person, place, and time. She appears well-developed and well-nourished. No distress.  HENT:  Head: Normocephalic and atraumatic.  OP clear. Tacky mucus membranes.   Eyes: Conjunctivae and EOM are normal. Pupils are equal, round, and reactive to light. No scleral icterus.  No nystagmus   Neck: Normal range of motion. Neck supple.  Full active and passive ROM without pain.  No midline or paraspinal tenderness. No nuchal rigidity or meningeal signs.  Cardiovascular: Normal rate, regular rhythm, normal heart sounds and intact distal pulses.   Pulmonary/Chest: Effort normal and breath sounds normal. No respiratory distress. She has no wheezes. She has no rales.  Abdominal: Soft. Bowel sounds are normal. She exhibits no distension. There is no tenderness.  Musculoskeletal: Normal range of motion.  Lymphadenopathy:    She has no cervical adenopathy.  Neurological: She is alert and oriented to person, place, and time. She has normal reflexes. No cranial nerve deficit. Coordination normal.  Mental Status: Alert, oriented, and thought content is appropriate. Speech is fluent without evidence of aphasia. Able to follow two-step commands without difficulty.  Cranial Nerves:  II - Peripheral visual fields grossly normal, pupils equal, round, reactive to light III, IV, VI - Bilateral EOM intact, no ptosis V - Facial light touch sensation intact and equal VII - Facial symmetry: smile, raised eyebrows ; Eyelids kept closed against resistance VIII - Hearing grossly normal bilaterally  IX, X - Uvula midline XI - Bilateral shoulder shrug equal and strong XII - Tongue extension midline 5/5 muscle strength of upper and lower extremities bilaterally including strong and equal grip strength and plantar/dorsiflexion.  Light touch sensory intact.   Skin: Skin is warm and dry. No rash noted. She is not diaphoretic.  Nursing note  and vitals reviewed.    ED Treatments / Results  Labs (all labs ordered are listed, but only abnormal results are displayed) Labs Reviewed  BASIC METABOLIC PANEL - Abnormal; Notable for the following:       Result Value   Glucose, Bld 107 (*)    All other components within normal limits  URINALYSIS, ROUTINE W REFLEX MICROSCOPIC (NOT AT [facility]) - Abnormal; Notable for the following:    Leukocytes, UA TRACE (*)    All other components within normal limits  URINE MICROSCOPIC-ADD ON - Abnormal; Notable for the following:    Squamous Epithelial / LPF 6-30 (*)    Bacteria, UA FEW (*)    All other components within normal limits  CBC - Abnormal; Notable for the following:    WBC 14.5 (*)    Platelets 499 (*)    All other components within normal limits  CBG MONITORING, ED - Abnormal; Notable for the following:    Glucose-Capillary 114 (*)    All other components within normal limits  URINE CULTURE  CBG MONITORING, ED    EKG  EKG Interpretation  Date/Time:  Tuesday July 28 2016 14:07:52 EDT Ventricular Rate:  75 PR Interval:    QRS  Duration: 94 QT Interval:  440 QTC Calculation: 492 R Axis:   3 Text Interpretation:  Sinus rhythm Probable left atrial enlargement Anteroseptal infarct, age indeterminate similar to prior EKG Confirmed by BELFI  MD, MELANIE [id number]) on 07/28/2016 3:03:41 PM Also confirmed by BELFI  MD, MELANIE [phone]), editor Lorenda Cahill CT, Leda Gauze [phone])  on 07/28/2016 4:54:09 PM       Radiology Ct Head Wo Contrast  Result Date: 07/28/2016 CLINICAL DATA:  Headache and emesis for the past few days, worsening today. EXAM: CT HEAD WITHOUT CONTRAST TECHNIQUE: Contiguous axial images were obtained from the base of the skull through the vertex without intravenous contrast. COMPARISON:  None. FINDINGS: Brain: Ventricles are normal in size and configuration. There is a mass within the sella which measures 1.8 x 1.2 x 1.5 cm, with a low-density cystic or fatty component, most  suggestive of a pituitary mass. No mass, hemorrhage or edema within the cerebral hemispheres or cerebellum. Vascular: No hyperdense vessel or unexpected calcification. Skull: Negative for fracture or focal lesion. Sinuses/Orbits: Mucosal thickening within the ethmoid air cells and sphenoid sinus, of uncertain chronicity. Lower portions of the paranasal sinuses are excluded on this exam. Other: None. IMPRESSION: 1. Mixed-density mass within the sella which measures 1.8 x 1.2 x 1.5 cm, with a low-density cystic or fatty component, most suggestive of pituitary mass. Recommend brain MRI, with contrast, using pituitary protocol. 2. Ethmoid and sphenoid sinusitis, of uncertain age. 3. Remainder of the head CT is normal. No mass, hemorrhage or edema within the cerebral hemispheres or cerebellum. No hydrocephalus. These results and recommendations were called by telephone at the time of interpretation on 07/28/2016 at 4:38 pm to Dr. Pearlie Oyster , who verbally acknowledged these results. Electronically Signed   By: Franki Cabot M.D.   On: 07/28/2016 16:45    Procedures Procedures (including critical care time)  Medications Ordered in ED Medications  sodium chloride 0.9 % bolus 1,000 mL (0 mLs Intravenous Stopped 07/28/16 1739)  sodium chloride 0.9 % bolus 500 mL (500 mLs Intravenous New Bag/Given 07/28/16 1749)  ketorolac (TORADOL) 30 MG/ML injection 30 mg (30 mg Intravenous Given 07/28/16 1749)     Initial Impression / Assessment and Plan / ED Course  I have reviewed the triage vital signs and the nursing notes.  Pertinent labs & imaging results that were available during my care of the patient were reviewed by me and considered in my medical decision making (see chart for details).  Clinical Course   Janet Sherman presents to ED for intermittent headache that is worse with positional changes x 1 week. No focal neuro deficits on exam. Patient with no history of headaches in the past. Will order CT head. Patient  also complaining of generalized weakness and one episode of emesis. Abdomen soft and nontender. Denies abdominal pain. No nausea present. Will also obtain basic lab work urine.  Labs reviewed. Urine with leuks, negative nitrite- likely contaminant, no urinary symptoms, will send for cx.   CT head shows findings most suggestive of a pituitary mass. Radiology recommending brain MRI with contrast using the pituitary protocol. Remainder head CT unremarkable. This is imaging that is appropriate for outpatient workup. Findings discussed with patient and family at bedside. Will provide neurosurgery follow up information. PCP follow up also recommended. Reasons to return to ED were discussed with patient and family at bedside. Tylenol or ibuprofen as needed for headache. Symptoms improved today with fluids and toradol. All questions answered.   Patient discussed with Dr.  Roscoe who agrees with treatment plan.   Final Clinical Impressions(s) / ED Diagnoses   Final diagnoses:  Pituitary mass [facility])    New Prescriptions New Prescriptions   No medications on file     [facility], PA-C 07/28/16 1911    Malvin Johns, MD 07/28/16 [phone]

## 2016-07-28 NOTE — Discharge Instructions (Signed)
Please call your primary physician or the neurosurgery clinic listed in the morning to schedule a follow up appointment. Take ibuprofen or tylenol as needed for headache. Stay well hydrated. Return to ER for changes in vision, slurred speech, worsening headache, new or worsening symptoms, any additional concerns.

## 2016-07-28 NOTE — ED Triage Notes (Signed)
Pt's O2 saturation dropped to 87% while in triage. Pt placed on 2L [place], stats improved to 96.

## 2016-07-28 NOTE — ED Notes (Signed)
Pt ambulatory and independent at discharge.  Family with patient.  All verbalized understanding of discharge instructions.

## 2016-07-28 NOTE — Progress Notes (Addendum)
[facility] ED CM noted pt with BCBS coverage but no pcp listed Pt states she has her card at home and will return to provide card to [facility] ED registration [facility] ED CM spoke with pt on how to obtain an in network pcp with insurance coverage via the customer service number or web site  Cm reviewed ED level of care for crisis/emergent services and community pcp level of care to manage continuous or chronic medical concerns.  The pt voiced understanding CM encouraged pt and discussed pt's responsibility to verify with pt's insurance carrier that any recommended medical provider offered by any emergency room or a hospital provider is within the carrier's network. The pt voiced understanding

## 2016-07-30 LAB — URINE CULTURE: Culture: >=100000 — AB

## 2016-07-31 ENCOUNTER — Telehealth (HOSPITAL_BASED_OUTPATIENT_CLINIC_OR_DEPARTMENT_OTHER): Payer: Self-pay | Admitting: *Deleted

## 2016-07-31 NOTE — Telephone Encounter (Signed)
Post ED Visit - Positive Culture Follow-up  Culture report reviewed by antimicrobial stewardship pharmacist:  []  Elenor Quinones, Pharm.D. []  Heide Guile, Pharm.D., BCPS []  Parks Neptune, Pharm.D. []  Alycia Rossetti, Pharm.D., BCPS []  [place], [place].D., BCPS, AAHIVP []  Legrand Como, Pharm.D., BCPS, AAHIVP []  Milus Glazier, Pharm.D. []  Rob [place], Pharm.D. Blossom Hoops PharmD Positive Enterobacter aerogenes urine culture Treated with no antibiotic per A. Harris PA-C no further patient follow-up is required at this time.  Clent Damore, Philis Nettle 07/31/2016, 1:58 PM

## 2016-09-14 ENCOUNTER — Other Ambulatory Visit: Payer: Self-pay | Admitting: Neurosurgery

## 2016-09-28 ENCOUNTER — Other Ambulatory Visit: Payer: Self-pay | Admitting: Otolaryngology

## 2016-09-29 ENCOUNTER — Other Ambulatory Visit: Payer: Self-pay | Admitting: Otolaryngology

## 2016-10-08 ENCOUNTER — Encounter (HOSPITAL_COMMUNITY)
Admission: RE | Admit: 2016-10-08 | Discharge: 2016-10-08 | Disposition: A | Discharge status: Home / Self Care | Payer: BLUE CROSS/BLUE SHIELD | Source: Ambulatory Visit | Attending: Neurosurgery | Admitting: Neurosurgery

## 2016-10-08 ENCOUNTER — Encounter (HOSPITAL_COMMUNITY): Payer: Self-pay

## 2016-10-08 DIAGNOSIS — Z01818 Encounter for other preprocedural examination: Secondary | ICD-10-CM | POA: Insufficient documentation

## 2016-10-08 LAB — CBC
HEMATOCRIT: 39.8 % (ref 36.0–46.0)
HEMOGLOBIN: 13.5 g/dL (ref 12.0–15.0)
MCH: 27.6 pg (ref 26.0–34.0)
MCHC: 33.9 g/dL (ref 30.0–36.0)
MCV: 81.2 fL (ref 78.0–100.0)
Platelets: 453 10*3/uL — ABNORMAL HIGH (ref 150–400)
RBC: 4.9 MIL/uL (ref 3.87–5.11)
RDW: 13.6 % (ref 11.5–15.5)
WBC: 11.5 10*3/uL — ABNORMAL HIGH (ref 4.0–10.5)

## 2016-10-08 LAB — BASIC METABOLIC PANEL
ANION GAP: 6 (ref 5–15)
BUN: 7 mg/dL (ref 6–20)
CALCIUM: 9.5 mg/dL (ref 8.9–10.3)
CO2: 28 mmol/L (ref 22–32)
CREATININE: 0.83 mg/dL (ref 0.44–1.00)
Chloride: 105 mmol/L (ref 101–111)
GFR calc Af Amer: >60 mL/min (ref >60)
GLUCOSE: 109 mg/dL — AB (ref 65–99)
Potassium: 3.2 mmol/L — ABNORMAL LOW (ref 3.5–5.1)
Sodium: 139 mmol/L (ref 135–145)

## 2016-10-08 LAB — TYPE AND SCREEN
ABO/RH(D): A POS
Antibody Screen: NEGATIVE

## 2016-10-08 LAB — ABO/RH: ABO/RH(D): A POS

## 2016-10-08 NOTE — Progress Notes (Signed)
PCP - denies Cardiologist -denies   Chest x-ray - not needed EKG -  07/28/16 Stress Test -denies  ECHO - denies Cardiac Cath - denies    Patient denies shortness of breath, fever, cough and chest pain at PAT appointment

## 2016-10-08 NOTE — Pre-Procedure Instructions (Signed)
Progress    Kendis Dasher  10/08/2016      [facility] [place], [place] - [address] [place] [address] Phone: [phone] Fax: [phone]    Your procedure is scheduled on October 20  Report to [facility] at [facility].  Call this number if you have problems the morning of surgery:  [phone]   Remember:  Do not eat food or drink liquids after midnight.   Take these medicines the morning of surgery with A SIP OF WATER acetaminophen (TYLENOL), allergy medicaions  7 days prior to surgery STOP taking any Aspirin, Aleve, Naproxen, Ibuprofen, Motrin, Advil, Goody's, BC's, all herbal medications, fish oil, and all vitamins    Do not wear jewelry, make-up or nail polish.  Do not wear lotions, powders, or perfumes, or deoderant.  Do not shave 48 hours prior to surgery.    Do not bring valuables to the hospital.  [facility] is not responsible for any belongings or valuables.  Contacts, dentures or bridgework may not be worn into surgery.  Leave your suitcase in the car.  After surgery it may be brought to your room.  For patients admitted to the hospital, discharge time will be determined by your treatment team.  Patients discharged the day of surgery will not be allowed to drive home.    Special instructions:   [facility]- Preparing For Surgery  Before surgery, you can play an important role. Because skin is not sterile, your skin needs to be as free of germs as possible. You can reduce the number of germs on your skin by washing with CHG (chlorahexidine gluconate) Soap before surgery.  CHG is an antiseptic cleaner which kills germs and bonds with the skin to continue killing germs even after washing.  Please do not use if you have an allergy to CHG or antibacterial soaps. If your skin becomes reddened/irritated stop using the CHG.  Do not shave (including legs and underarms) for at least 48 hours  prior to first CHG shower. It is OK to shave your face.  Please follow these instructions carefully.   1. Shower the NIGHT BEFORE SURGERY and the MORNING OF SURGERY with CHG.   2. If you chose to wash your hair, wash your hair first as usual with your normal shampoo.  3. After you shampoo, rinse your hair and body thoroughly to remove the shampoo.  4. Use CHG as you would any other liquid soap. You can apply CHG directly to the skin and wash gently with a scrungie or a clean washcloth.   5. Apply the CHG Soap to your body ONLY FROM THE NECK DOWN.  Do not use on open wounds or open sores. Avoid contact with your eyes, ears, mouth and genitals (private parts). Wash genitals (private parts) with your normal soap.  6. Wash thoroughly, paying special attention to the area where your surgery will be performed.  7. Thoroughly rinse your body with warm water from the neck down.  8. DO NOT shower/wash with your normal soap after using and rinsing off the CHG Soap.  9. Pat yourself dry with a CLEAN TOWEL.   10. Wear CLEAN PAJAMAS   11. Place CLEAN SHEETS on your bed the night of your first shower and DO NOT SLEEP WITH PETS.    Day of Surgery: Do not apply any deodorants/lotions. Please wear clean clothes to the hospital/surgery center.      Please read over  the following fact sheets that you were given. Coughing and Deep Breathing, Blood Transfusion Information and Surgical Site Infection Prevention

## 2016-10-09 NOTE — Progress Notes (Signed)
Anesthesia Chart Review:  Pt is a 65 year old female scheduled for craniotomy hypophysectomy transfacial approach, transnasal approach with fusion on 10/16/2016 with Consuella Lose, MD  PMH includes:  Gallstones. Current smoker. BMI 18  Medications include: ASA  Preoperative labs reviewed.    EKG 8/12/30/15: Sinus rhythm. Probable left atrial enlargement. Anteroseptal infarct, age indeterminate. Appears stable when compared to EKG 08/10/13.   If no changes, I anticipate pt can proceed with surgery as scheduled.   Willeen Cass, FNP-BC [facility] Short Stay Surgical Center/Anesthesiology Phone: [phone] 10/09/2016 1:07 PM

## 2016-10-15 MED ORDER — CEFAZOLIN SODIUM-DEXTROSE 2-4 GM/100ML-% IV SOLN
2.0000 g | INTRAVENOUS | Status: AC
  Administered 2016-10-16: 2 g via INTRAVENOUS
  Filled 2016-10-15: qty 100

## 2016-10-15 MED ORDER — DEXAMETHASONE SODIUM PHOSPHATE 10 MG/ML IJ SOLN
10.0000 mg | Freq: Once | INTRAMUSCULAR | Status: AC
  Administered 2016-10-16: 10 mg via INTRAVENOUS
  Filled 2016-10-15: qty 1

## 2016-10-16 ENCOUNTER — Inpatient Hospital Stay (HOSPITAL_COMMUNITY): Payer: BLUE CROSS/BLUE SHIELD | Admitting: Anesthesiology

## 2016-10-16 ENCOUNTER — Inpatient Hospital Stay (HOSPITAL_COMMUNITY): Payer: BLUE CROSS/BLUE SHIELD | Admitting: Emergency Medicine

## 2016-10-16 ENCOUNTER — Encounter (HOSPITAL_COMMUNITY): Payer: Self-pay | Admitting: Anesthesiology

## 2016-10-16 ENCOUNTER — Inpatient Hospital Stay (HOSPITAL_COMMUNITY)
Admission: RE | Admit: 2016-10-16 | Discharge: 2016-10-18 | DRG: 615 | Disposition: A | Discharge status: Home / Self Care | Payer: BLUE CROSS/BLUE SHIELD | Source: Ambulatory Visit | Attending: Neurosurgery | Admitting: Neurosurgery

## 2016-10-16 ENCOUNTER — Encounter (HOSPITAL_COMMUNITY)
Admission: RE | Disposition: A | Discharge status: Home / Self Care | Payer: Self-pay | Source: Ambulatory Visit | Attending: Neurosurgery

## 2016-10-16 DIAGNOSIS — Z79899 Other long term (current) drug therapy: Secondary | ICD-10-CM | POA: Diagnosis not present

## 2016-10-16 DIAGNOSIS — Z7982 Long term (current) use of aspirin: Secondary | ICD-10-CM | POA: Diagnosis not present

## 2016-10-16 DIAGNOSIS — D352 Benign neoplasm of pituitary gland: Principal | ICD-10-CM | POA: Diagnosis present

## 2016-10-16 DIAGNOSIS — J320 Chronic maxillary sinusitis: Secondary | ICD-10-CM | POA: Diagnosis present

## 2016-10-16 DIAGNOSIS — F17210 Nicotine dependence, cigarettes, uncomplicated: Secondary | ICD-10-CM | POA: Diagnosis present

## 2016-10-16 DIAGNOSIS — Z23 Encounter for immunization: Secondary | ICD-10-CM | POA: Diagnosis not present

## 2016-10-16 DIAGNOSIS — H269 Unspecified cataract: Secondary | ICD-10-CM | POA: Diagnosis present

## 2016-10-16 HISTORY — PX: CRANIOTOMY: SHX93

## 2016-10-16 HISTORY — PX: TRANSNASAL APPROACH: SHX6149

## 2016-10-16 SURGERY — CRANIOTOMY HYPOPHYSECTOMY TRANSNASAL APPROACH
Anesthesia: General | Site: Nose

## 2016-10-16 SURGERY — Surgical Case
Anesthesia: *Unknown

## 2016-10-16 MED ORDER — FENTANYL CITRATE (PF) 100 MCG/2ML IJ SOLN
INTRAMUSCULAR | Status: AC
  Filled 2016-10-16: qty 4

## 2016-10-16 MED ORDER — HYDROMORPHONE HCL 2 MG/ML IJ SOLN
INTRAMUSCULAR | Status: AC
  Filled 2016-10-16: qty 1

## 2016-10-16 MED ORDER — BACITRACIN ZINC 500 UNIT/GM EX OINT
TOPICAL_OINTMENT | CUTANEOUS | Status: AC
  Filled 2016-10-16: qty 28.35

## 2016-10-16 MED ORDER — PROPOFOL 10 MG/ML IV BOLUS
INTRAVENOUS | Status: AC
Start: 2016-10-16 — End: 2016-10-16
  Filled 2016-10-16: qty 20

## 2016-10-16 MED ORDER — BACITRACIN ZINC 500 UNIT/GM EX OINT
TOPICAL_OINTMENT | CUTANEOUS | Status: DC | PRN
  Administered 2016-10-16: 1 via TOPICAL

## 2016-10-16 MED ORDER — ROCURONIUM BROMIDE 10 MG/ML (PF) SYRINGE
PREFILLED_SYRINGE | INTRAVENOUS | Status: AC
  Filled 2016-10-16: qty 10

## 2016-10-16 MED ORDER — PHENYLEPHRINE 40 MCG/ML (10ML) SYRINGE FOR IV PUSH (FOR BLOOD PRESSURE SUPPORT)
PREFILLED_SYRINGE | INTRAVENOUS | Status: AC
  Filled 2016-10-16: qty 10

## 2016-10-16 MED ORDER — DOCUSATE SODIUM 100 MG PO CAPS
100.0000 mg | ORAL_CAPSULE | Freq: Two times a day (BID) | ORAL | Status: DC
  Administered 2016-10-16 – 2016-10-17 (×3): 100 mg via ORAL
  Filled 2016-10-16 (×3): qty 1

## 2016-10-16 MED ORDER — PROMETHAZINE HCL 12.5 MG PO TABS
12.5000 mg | ORAL_TABLET | ORAL | Status: DC | PRN
  Filled 2016-10-16: qty 2

## 2016-10-16 MED ORDER — LACTATED RINGERS IV SOLN
INTRAVENOUS | Status: DC | PRN
  Administered 2016-10-16: 08:00:00 via INTRAVENOUS

## 2016-10-16 MED ORDER — PROMETHAZINE HCL 25 MG/ML IJ SOLN: 6.2500 mg | INTRAMUSCULAR | Status: DC | PRN

## 2016-10-16 MED ORDER — ADULT MULTIVITAMIN W/MINERALS CH
1.0000 | ORAL_TABLET | Freq: Every day | ORAL | Status: DC
  Administered 2016-10-17: 1 via ORAL
  Filled 2016-10-16: qty 1

## 2016-10-16 MED ORDER — HYDROMORPHONE HCL 2 MG/ML IJ SOLN
0.2500 mg | INTRAMUSCULAR | Status: DC | PRN
Start: 2016-10-16 — End: 2016-10-16
  Administered 2016-10-16: 0.5 mg via INTRAVENOUS

## 2016-10-16 MED ORDER — INFLUENZA VAC SPLIT QUAD 0.5 ML IM SUSY
0.5000 mL | PREFILLED_SYRINGE | INTRAMUSCULAR | Status: DC
  Filled 2016-10-16 (×2): qty 0.5

## 2016-10-16 MED ORDER — LACTATED RINGERS IV SOLN
INTRAVENOUS | Status: DC | PRN
  Administered 2016-10-16 (×2): via INTRAVENOUS

## 2016-10-16 MED ORDER — LIDOCAINE-EPINEPHRINE 1 %-1:100000 IJ SOLN
INTRAMUSCULAR | Status: DC | PRN
  Administered 2016-10-16: 10 mL

## 2016-10-16 MED ORDER — ESMOLOL HCL 100 MG/10ML IV SOLN
INTRAVENOUS | Status: DC | PRN
  Administered 2016-10-16: 50 mg via INTRAVENOUS

## 2016-10-16 MED ORDER — HYDROCORTISONE NA SUCCINATE PF 100 MG IJ SOLR
50.0000 mg | Freq: Two times a day (BID) | INTRAMUSCULAR | Status: AC
  Administered 2016-10-16: 50 mg via INTRAVENOUS
  Filled 2016-10-16: qty 2

## 2016-10-16 MED ORDER — OXYMETAZOLINE HCL 0.05 % NA SOLN
NASAL | Status: DC | PRN
  Administered 2016-10-16 (×2): 1 via TOPICAL

## 2016-10-16 MED ORDER — ONDANSETRON HCL 4 MG PO TABS: 4.0000 mg | ORAL_TABLET | ORAL | Status: DC | PRN

## 2016-10-16 MED ORDER — ONDANSETRON HCL 4 MG/2ML IJ SOLN
INTRAMUSCULAR | Status: DC | PRN
  Administered 2016-10-16: 4 mg via INTRAVENOUS

## 2016-10-16 MED ORDER — CALCIUM CARBONATE-VITAMIN D 500-200 MG-UNIT PO TABS
1.0000 | ORAL_TABLET | Freq: Every day | ORAL | Status: DC
  Administered 2016-10-17: 1 via ORAL
  Filled 2016-10-16 (×2): qty 1

## 2016-10-16 MED ORDER — ARTIFICIAL TEARS OP OINT
TOPICAL_OINTMENT | OPHTHALMIC | Status: DC | PRN
  Administered 2016-10-16: 1 via OPHTHALMIC

## 2016-10-16 MED ORDER — SUGAMMADEX SODIUM 200 MG/2ML IV SOLN
INTRAVENOUS | Status: DC | PRN
  Administered 2016-10-16: 200 mg via INTRAVENOUS

## 2016-10-16 MED ORDER — ONDANSETRON HCL 4 MG/2ML IJ SOLN
4.0000 mg | INTRAMUSCULAR | Status: DC | PRN
  Administered 2016-10-16: 4 mg via INTRAVENOUS
  Filled 2016-10-16: qty 2

## 2016-10-16 MED ORDER — OXYMETAZOLINE HCL 0.05 % NA SOLN
NASAL | Status: AC
  Filled 2016-10-16: qty 15

## 2016-10-16 MED ORDER — HYDROCORTISONE NA SUCCINATE PF 100 MG IJ SOLR
INTRAMUSCULAR | Status: DC | PRN
  Administered 2016-10-16: 100 mg via INTRAVENOUS

## 2016-10-16 MED ORDER — HEMOSTATIC AGENTS (NO CHARGE) OPTIME
TOPICAL | Status: DC | PRN
  Administered 2016-10-16: 1 via TOPICAL

## 2016-10-16 MED ORDER — SODIUM CHLORIDE 0.9 % IV SOLN
0.0125 ug/kg/min | INTRAVENOUS | Status: AC
  Administered 2016-10-16: .1 ug/kg/min via INTRAVENOUS
  Filled 2016-10-16: qty 2000

## 2016-10-16 MED ORDER — LABETALOL HCL 5 MG/ML IV SOLN: 10.0000 mg | INTRAVENOUS | Status: DC | PRN

## 2016-10-16 MED ORDER — SUGAMMADEX SODIUM 200 MG/2ML IV SOLN
INTRAVENOUS | Status: AC
  Filled 2016-10-16: qty 2

## 2016-10-16 MED ORDER — LIDOCAINE HCL (CARDIAC) 20 MG/ML IV SOLN
INTRAVENOUS | Status: DC | PRN
  Administered 2016-10-16: 100 mg via INTRAVENOUS

## 2016-10-16 MED ORDER — SALINE SPRAY 0.65 % NA SOLN
4.0000 | NASAL | Status: DC | PRN
  Filled 2016-10-16: qty 44

## 2016-10-16 MED ORDER — MIDAZOLAM HCL 2 MG/2ML IJ SOLN
INTRAMUSCULAR | Status: AC
  Filled 2016-10-16: qty 2

## 2016-10-16 MED ORDER — PHENYLEPHRINE HCL 10 MG/ML IJ SOLN
INTRAVENOUS | Status: DC | PRN
  Administered 2016-10-16: 50 ug/min via INTRAVENOUS

## 2016-10-16 MED ORDER — ARTIFICIAL TEARS OP OINT
TOPICAL_OINTMENT | OPHTHALMIC | Status: AC
  Filled 2016-10-16: qty 3.5

## 2016-10-16 MED ORDER — THROMBIN 5000 UNITS EX SOLR
CUTANEOUS | Status: AC
  Filled 2016-10-16: qty 5000

## 2016-10-16 MED ORDER — CEFAZOLIN IN D5W 1 GM/50ML IV SOLN
1.0000 g | Freq: Three times a day (TID) | INTRAVENOUS | Status: DC
  Administered 2016-10-16 – 2016-10-17 (×5): 1 g via INTRAVENOUS
  Filled 2016-10-16 (×7): qty 50

## 2016-10-16 MED ORDER — MICROFIBRILLAR COLL HEMOSTAT EX PADS
MEDICATED_PAD | CUTANEOUS | Status: DC | PRN
  Administered 2016-10-16: 1 via TOPICAL

## 2016-10-16 MED ORDER — FAMOTIDINE IN NACL 20-0.9 MG/50ML-% IV SOLN
20.0000 mg | Freq: Two times a day (BID) | INTRAVENOUS | Status: DC
  Administered 2016-10-16 – 2016-10-17 (×3): 20 mg via INTRAVENOUS
  Filled 2016-10-16 (×3): qty 50

## 2016-10-16 MED ORDER — SODIUM CHLORIDE 0.9 % IR SOLN
Status: DC | PRN
  Administered 2016-10-16 (×2): 1000 mL

## 2016-10-16 MED ORDER — LIDOCAINE 2% (20 MG/ML) 5 ML SYRINGE
INTRAMUSCULAR | Status: AC
  Filled 2016-10-16: qty 5

## 2016-10-16 MED ORDER — CEFAZOLIN IN D5W 1 GM/50ML IV SOLN: 1.0000 g | Freq: Three times a day (TID) | INTRAVENOUS | Status: DC

## 2016-10-16 MED ORDER — ONDANSETRON HCL 4 MG/2ML IJ SOLN
INTRAMUSCULAR | Status: AC
Start: 2016-10-16 — End: 2016-10-16
  Filled 2016-10-16: qty 2

## 2016-10-16 MED ORDER — SENNA 8.6 MG PO TABS
1.0000 | ORAL_TABLET | Freq: Two times a day (BID) | ORAL | Status: DC
  Administered 2016-10-16 – 2016-10-17 (×3): 8.6 mg via ORAL
  Filled 2016-10-16 (×3): qty 1

## 2016-10-16 MED ORDER — LIDOCAINE-EPINEPHRINE 1 %-1:100000 IJ SOLN
INTRAMUSCULAR | Status: AC
  Filled 2016-10-16: qty 1

## 2016-10-16 MED ORDER — THROMBIN 5000 UNITS EX SOLR
CUTANEOUS | Status: AC
  Filled 2016-10-16: qty 10000

## 2016-10-16 MED ORDER — CHLORHEXIDINE GLUCONATE CLOTH 2 % EX PADS: 6.0000 | MEDICATED_PAD | Freq: Once | CUTANEOUS | Status: DC

## 2016-10-16 MED ORDER — BISACODYL 10 MG RE SUPP: 10.0000 mg | Freq: Every day | RECTAL | Status: DC | PRN

## 2016-10-16 MED ORDER — SODIUM CHLORIDE 0.9 % IR SOLN
Status: DC | PRN
  Administered 2016-10-16: 500 mL

## 2016-10-16 MED ORDER — FENTANYL CITRATE (PF) 100 MCG/2ML IJ SOLN
INTRAMUSCULAR | Status: DC | PRN
  Administered 2016-10-16 (×2): 25 ug via INTRAVENOUS
  Administered 2016-10-16: 50 ug via INTRAVENOUS
  Administered 2016-10-16: 25 ug via INTRAVENOUS

## 2016-10-16 MED ORDER — THROMBIN 5000 UNITS EX SOLR
CUTANEOUS | Status: DC | PRN
Start: 2016-10-16 — End: 2016-10-16
  Administered 2016-10-16 (×2): 5000 [IU] via TOPICAL

## 2016-10-16 MED ORDER — ESMOLOL HCL 100 MG/10ML IV SOLN
INTRAVENOUS | Status: AC
  Filled 2016-10-16: qty 10

## 2016-10-16 MED ORDER — SODIUM CHLORIDE 0.9 % IV SOLN
INTRAVENOUS | Status: DC
Start: 2016-10-16 — End: 2016-10-17
  Administered 2016-10-16 – 2016-10-17 (×2): via INTRAVENOUS

## 2016-10-16 MED ORDER — PROPOFOL 10 MG/ML IV BOLUS
INTRAVENOUS | Status: DC | PRN
  Administered 2016-10-16: 100 mg via INTRAVENOUS
  Administered 2016-10-16: 40 mg via INTRAVENOUS

## 2016-10-16 MED ORDER — HYDROCODONE-ACETAMINOPHEN 5-325 MG PO TABS
1.0000 | ORAL_TABLET | ORAL | Status: DC | PRN
  Administered 2016-10-16 – 2016-10-18 (×5): 1 via ORAL
  Filled 2016-10-16 (×5): qty 1

## 2016-10-16 MED ORDER — SODIUM CHLORIDE 0.9 % IV SOLN
0.0125 ug/kg/min | INTRAVENOUS | Status: DC
  Filled 2016-10-16: qty 2000

## 2016-10-16 MED ORDER — ROCURONIUM BROMIDE 100 MG/10ML IV SOLN
INTRAVENOUS | Status: DC | PRN
  Administered 2016-10-16 (×4): 10 mg via INTRAVENOUS
  Administered 2016-10-16: 5 mg via INTRAVENOUS
  Administered 2016-10-16: 10 mg via INTRAVENOUS
  Administered 2016-10-16: 50 mg via INTRAVENOUS
  Administered 2016-10-16: 5 mg via INTRAVENOUS

## 2016-10-16 SURGICAL SUPPLY — 133 items
APL SKNCLS STERI-STRIP NONHPOA (GAUZE/BANDAGES/DRESSINGS)
ATTRACTOMAT 16X20 MAGNETIC DRP (DRAPES) ×1 IMPLANT
BAG DECANTER FOR FLEXI CONT (MISCELLANEOUS) ×3 IMPLANT
BENZOIN TINCTURE PRP APPL 2/3 (GAUZE/BANDAGES/DRESSINGS) ×2 IMPLANT
BLADE RAD 40 CVD SINUS 4MM (BLADE) IMPLANT
BLADE RAD60 ROTATE M4 4 5PK (BLADE) IMPLANT
BLADE RAD60 ROTATE M4 4MM 5PK (BLADE)
BLADE ROTATE RAD 40 4 M4 (BLADE) ×1 IMPLANT
BLADE ROTATE RAD 40 4MM M4 (BLADE) ×1
BLADE ROTATE TRICUT 4MX13CM M4 (BLADE) ×1
BLADE ROTATE TRICUT 4X13 M4 (BLADE) ×2 IMPLANT
BLADE SURG 10 STRL SS (BLADE) ×1 IMPLANT
BLADE SURG 11 STRL SS (BLADE) ×4 IMPLANT
BLADE SURG 15 STRL LF DISP TIS (BLADE) ×2 IMPLANT
BLADE SURG 15 STRL SS (BLADE)
BUR DIAMOND 13CMX5MM 70DEG (BURR)
BUR DIAMOND 13X5 70D (BURR) IMPLANT
BUR DIAMOND 15CMX5MM 15SINUS (BURR)
BUR DIAMOND CURV 15X5 15D (BURR) IMPLANT
CANISTER SUCT 3000ML PPV (MISCELLANEOUS) ×5 IMPLANT
CANISTER SUCTION 2500CC (MISCELLANEOUS) ×1 IMPLANT
CLOSURE WOUND 1/2 X4 (GAUZE/BANDAGES/DRESSINGS)
COAGULATOR SUCT 6 FR SWTCH (ELECTROSURGICAL)
COAGULATOR SUCT 8FR VV (MISCELLANEOUS) ×2 IMPLANT
COAGULATOR SUCT SWTCH 10FR 6 (ELECTROSURGICAL) ×1 IMPLANT
CONT SPEC 4OZ CLIKSEAL STRL BL (MISCELLANEOUS) IMPLANT
CORDS BIPOLAR (ELECTRODE) ×1 IMPLANT
COVER BACK TABLE 60X90IN (DRAPES) IMPLANT
COVER MAYO STAND STRL (DRAPES) ×1 IMPLANT
CRADLE DONUT ADULT HEAD (MISCELLANEOUS) IMPLANT
DRAPE EENT ADH APERT 15X15 STR (DRAPES) IMPLANT
DRAPE HALF SHEET 40X57 (DRAPES) ×2 IMPLANT
DRAPE INCISE IOBAN 66X45 STRL (DRAPES) ×3 IMPLANT
DRAPE MICROSCOPE LEICA (MISCELLANEOUS) ×3 IMPLANT
DRAPE POUCH INSTRU U-SHP 10X18 (DRAPES) ×3 IMPLANT
DRAPE PROXIMA HALF (DRAPES) IMPLANT
DRAPE SURG 17X23 STRL (DRAPES) ×3 IMPLANT
DRESSING NASAL POPE 10X1.5X2.5 (GAUZE/BANDAGES/DRESSINGS) IMPLANT
DRSG NASAL POPE 10X1.5X2.5 (GAUZE/BANDAGES/DRESSINGS)
DRSG NASOPORE 8CM (GAUZE/BANDAGES/DRESSINGS) ×2 IMPLANT
DURASEAL SPINE SEALANT 3ML (MISCELLANEOUS) IMPLANT
ELECT NDL TIP 2.8 STRL (NEEDLE) ×1 IMPLANT
ELECT NEEDLE TIP 2.8 STRL (NEEDLE) IMPLANT
ELECT REM PT RETURN 9FT ADLT (ELECTROSURGICAL) ×3
ELECTRODE REM PT RTRN 9FT ADLT (ELECTROSURGICAL) ×1 IMPLANT
FILTER ARTHROSCOPY CONVERTOR (FILTER) ×4 IMPLANT
FLOSEAL 10ML (HEMOSTASIS) IMPLANT
GAUZE PACKING FOLDED 2  STR (GAUZE/BANDAGES/DRESSINGS)
GAUZE PACKING FOLDED 2 STR (GAUZE/BANDAGES/DRESSINGS) ×1 IMPLANT
GAUZE SPONGE 2X2 8PLY STRL LF (GAUZE/BANDAGES/DRESSINGS) ×1 IMPLANT
GAUZE SPONGE 4X4 12PLY STRL (GAUZE/BANDAGES/DRESSINGS) ×1 IMPLANT
GLOVE BIOGEL M 7.0 STRL (GLOVE) ×2 IMPLANT
GLOVE BIOGEL PI IND STRL 6.5 (GLOVE) IMPLANT
GLOVE BIOGEL PI IND STRL 7.0 (GLOVE) IMPLANT
GLOVE BIOGEL PI IND STRL 7.5 (GLOVE) ×1 IMPLANT
GLOVE BIOGEL PI INDICATOR 6.5 (GLOVE) ×6
GLOVE BIOGEL PI INDICATOR 7.0 (GLOVE) ×2
GLOVE BIOGEL PI INDICATOR 7.5 (GLOVE) ×2
GLOVE ECLIPSE 7.0 STRL STRAW (GLOVE) ×3 IMPLANT
GLOVE EXAM NITRILE LRG STRL (GLOVE) IMPLANT
GLOVE EXAM NITRILE XL STR (GLOVE) IMPLANT
GLOVE EXAM NITRILE XS STR PU (GLOVE) IMPLANT
GLOVE SURG SS PI 6.5 STRL IVOR (GLOVE) ×6 IMPLANT
GLOVE SURG SS PI 7.5 STRL IVOR (GLOVE) ×1 IMPLANT
GOWN STRL REUS W/ TWL LRG LVL3 (GOWN DISPOSABLE) ×2 IMPLANT
GOWN STRL REUS W/ TWL XL LVL3 (GOWN DISPOSABLE) IMPLANT
GOWN STRL REUS W/TWL 2XL LVL3 (GOWN DISPOSABLE) ×1 IMPLANT
GOWN STRL REUS W/TWL LRG LVL3 (GOWN DISPOSABLE) ×15
GOWN STRL REUS W/TWL XL LVL3 (GOWN DISPOSABLE) ×3
HEMOSTAT SURGICEL 2X14 (HEMOSTASIS) ×2 IMPLANT
KIT BASIN OR (CUSTOM PROCEDURE TRAY) ×6 IMPLANT
KIT DRAIN CSF ACCUDRAIN (MISCELLANEOUS) IMPLANT
KIT ROOM TURNOVER OR (KITS) ×4 IMPLANT
NDL HYPO 25GX1X1/2 BEV (NEEDLE) IMPLANT
NDL HYPO 25X1 1.5 SAFETY (NEEDLE) ×1 IMPLANT
NDL SPNL 20GX3.5 QUINCKE YW (NEEDLE) ×1 IMPLANT
NDL SPNL 22GX3.5 QUINCKE BK (NEEDLE) ×1 IMPLANT
NDL SPNL 25GX3.5 QUINCKE BL (NEEDLE) IMPLANT
NEEDLE HYPO 25GX1X1/2 BEV (NEEDLE) IMPLANT
NEEDLE HYPO 25X1 1.5 SAFETY (NEEDLE) ×3 IMPLANT
NEEDLE SPNL 20GX3.5 QUINCKE YW (NEEDLE) IMPLANT
NEEDLE SPNL 22GX3.5 QUINCKE BK (NEEDLE) IMPLANT
NEEDLE SPNL 25GX3.5 QUINCKE BL (NEEDLE) ×3 IMPLANT
NS IRRIG 1000ML POUR BTL (IV SOLUTION) ×6 IMPLANT
PAD ARMBOARD 7.5X6 YLW CONV (MISCELLANEOUS) ×7 IMPLANT
PATTIES SURGICAL .25X.25 (GAUZE/BANDAGES/DRESSINGS) IMPLANT
PATTIES SURGICAL .5 X.5 (GAUZE/BANDAGES/DRESSINGS) IMPLANT
PATTIES SURGICAL .5 X3 (DISPOSABLE) ×2 IMPLANT
PENCIL BUTTON HOLSTER BLD 10FT (ELECTRODE) ×1 IMPLANT
RUBBERBAND STERILE (MISCELLANEOUS) ×6 IMPLANT
SEALANT ADHERUS EXTEND TIP (MISCELLANEOUS) ×2 IMPLANT
SHEATH ENDOSCRUB 0 DEG (SHEATH) ×5 IMPLANT
SHEATH ENDOSCRUB 30 DEG (SHEATH) IMPLANT
SHEATH ENDOSCRUB 45 DEG (SHEATH) ×3 IMPLANT
SOLUTION ANTI FOG 6CC (MISCELLANEOUS) ×3 IMPLANT
SPECIMEN JAR SMALL (MISCELLANEOUS) ×4 IMPLANT
SPLINT NASAL DOYLE BI-VL (GAUZE/BANDAGES/DRESSINGS) IMPLANT
SPONGE GAUZE 2X2 STER 10/PKG (GAUZE/BANDAGES/DRESSINGS) ×2
SPONGE LAP 4X18 X RAY DECT (DISPOSABLE) ×1 IMPLANT
SPONGE NEURO XRAY DETECT 1X3 (DISPOSABLE) ×2 IMPLANT
SPONGE SURGIFOAM ABS GEL 12-7 (HEMOSTASIS) IMPLANT
STAPLER SKIN PROX WIDE 3.9 (STAPLE) ×3 IMPLANT
STRIP CLOSURE SKIN 1/2X4 (GAUZE/BANDAGES/DRESSINGS) ×2 IMPLANT
SUT BONE WAX W31G (SUTURE) ×1 IMPLANT
SUT CHROMIC 3 0 PS 2 (SUTURE) ×2 IMPLANT
SUT CHROMIC 4 0 P 3 18 (SUTURE) IMPLANT
SUT CHROMIC 4 0 PS 2 18 (SUTURE) ×1 IMPLANT
SUT CHROMIC 5 0 P 3 (SUTURE) IMPLANT
SUT ETHILON 3 0 PS 1 (SUTURE) IMPLANT
SUT ETHILON 4 0 PS 2 18 (SUTURE) ×2 IMPLANT
SUT ETHILON 5 0 P 3 18 (SUTURE)
SUT ETHILON 6 0 P 1 (SUTURE) IMPLANT
SUT NYLON ETHILON 5-0 P-3 1X18 (SUTURE) IMPLANT
SUT PLAIN 4 0 ~~LOC~~ 1 (SUTURE) ×2 IMPLANT
SUT SILK 2 0 FS (SUTURE) IMPLANT
SUT SILK 2 0 SH (SUTURE) ×2 IMPLANT
SUT VIC AB 4-0 P-3 18X BRD (SUTURE) ×1 IMPLANT
SUT VIC AB 4-0 P3 18 (SUTURE)
SWAB COLLECTION DEVICE MRSA (MISCELLANEOUS) IMPLANT
SYR 50ML SLIP (SYRINGE) IMPLANT
SYR CONTROL 10ML LL (SYRINGE) ×1 IMPLANT
SYRINGE 10CC LL (SYRINGE) ×3 IMPLANT
TOWEL OR 17X24 6PK STRL BLUE (TOWEL DISPOSABLE) ×8 IMPLANT
TOWEL OR 17X26 10 PK STRL BLUE (TOWEL DISPOSABLE) ×5 IMPLANT
TRACKER ENT INSTRUMENT (MISCELLANEOUS) ×5 IMPLANT
TRACKER ENT PATIENT (MISCELLANEOUS) ×3 IMPLANT
TRAY ENT MC OR (CUSTOM PROCEDURE TRAY) ×4 IMPLANT
TRAY FOLEY W/METER SILVER 16FR (SET/KITS/TRAYS/PACK) ×3 IMPLANT
TUBE CONNECTING 12'X1/4 (SUCTIONS) ×1
TUBE CONNECTING 12X1/4 (SUCTIONS) ×2 IMPLANT
TUBING STRAIGHTSHOT EPS 5PK (TUBING) ×5 IMPLANT
WATER STERILE IRR 1000ML POUR (IV SOLUTION) ×3 IMPLANT
WIPE INSTRUMENT VISIWIPE 73X73 (MISCELLANEOUS) ×1 IMPLANT

## 2016-10-16 NOTE — Progress Notes (Signed)
   ENT Progress Note: Pt for: CRANIOTOMY HYPOPHYSECTOMY TRANSNASAL APPROACH TRANSNASAL APPROACH WITH FUSION   Subjective: Stable preop  Objective: Vital signs in last 24 hours: Temp:  [97.7 F (36.5 C)] 97.7 F (36.5 C) (10/20 0607) Pulse Rate:  [53] 53 (10/20 0607) Resp:  [20] 20 (10/20 0607) BP: (150)/(84) 150/84 (10/20 0607) SpO2:  [100 %] 100 % (10/20 0607) Weight:  [48.1 kg (106 lb)] 48.1 kg (106 lb) (10/20 0607) Weight change:     Intake/Output from previous day: No intake/output data recorded. Intake/Output this shift: No intake/output data recorded.  Labs: No results for input(s): WBC, HGB, HCT, PLT in the last 72 hours. No results for input(s): NA, K, CL, CO2, GLUCOSE, BUN, CALCIUM in the last 72 hours.  Invalid input(s): CREATININR  Studies/Results: No results found.   PHYSICAL EXAM: Patent nasal cavity   Assessment/Plan: Pt for Endoscopic Trans-sphenoidal Pituitary Resection.    [place], Therese Rocco 10/16/2016, 7:32 AM

## 2016-10-16 NOTE — Op Note (Addendum)
PREOP DIAGNOSIS:  1. Pituitary tumor   POSTOP DIAGNOSIS: Same  PROCEDURE: 1. Endoscopic transsphenoidal resection of pituitary tumor  SURGEON: Dr. Consuella Lose, MD  CO-SURGEON: Dr. Jerrell Belfast, MD  ANESTHESIA: General Endotracheal  EBL: 100cc  SPECIMENS: Pituitary tumor for permanent pathology  DRAINS: None  COMPLICATIONS: None immediate  CONDITION: Hemodynamically stable to PACU  HISTORY: Janet Sherman is a 65 y.o. female who initially presented with HA, and MRI demonstrated a pituitary tumor with suprasellar extension and optic compression. Her endocrine profile was largely normal. With the suprasellar extension with optic compression, surgical resection for decompression and diagnosis was indicated. The risks and benefits of the surgery were explained in detail to the patient and her family. After all questions were answered, consent was obtained.  PROCEDURE IN DETAIL: After informed consent was obtained and witnessed, the patient was brought to the operating room. After induction of general anesthesia, the patient was positioned on the operative table in the [place] headholder in supine position. All pressure points were meticulously padded.   After timeout was conducted, approach to the sella turcica and sphenoid sinus was made by Dr. Wilburn Cornelia, details of which are dictated separately. Once the anterior face of the sella was identified, it was noted to be somewhat dehiscent of bone. The face of the sella was further opened to fully expose the dura of the anterior sella turcica. The optical carotid recess was identified bilaterally. The dura was then opened in a cruciate fashion. Immediately expressed was soft, can colored tumor. Portions of the tumor were removed and sent for permanent pathology. At this point, using a combination of ring curettes and microdissectors, tumor was removed initially from the floor of the sella turcica, working laterally from there. As tumor  was removed, the diaphragm sella came into view superiorly. Tumor was scraped from the diaphragm. A 45 endoscope was then introduced, And small pieces of tumor were also removed more from the right side of the sella and towards the cavernous sinus. The sella turcica was then inspected, and there appeared to be good resection. There may be a miniscule portion of tumor remaining on the right side of the sella adherent to the cavernous sinus or the lateral portion of the diaphragm which we were unable to remove. While resecting the last portions of tumor, there appeared to be a tiny amount of cerebrospinal fluid weeping from the diaphragm, although no obvious defect in the diaphragm sella was identified.  At this point, the field was irrigated with normal saline irrigation. Hemostasis was secured easily with Gelfoam soaked in thrombin. The sellar defect was then covered with a layer of Gelfoam, and sprayed with Adherus dural sealant. The dural sealant was then covered with another layer of Gelfoam.  Closure of the nasal approach was then completed by Dr. Wilburn Cornelia, details again dictated separately.  The patient was then removed from the [place] head holder, transferred to the stretcher, extubated, and taken to the postanesthesia care unit in stable hemodynamic condition. At the end of the case all sponge, needle, instrument, and cottonoid counts were correct.

## 2016-10-16 NOTE — Brief Op Note (Signed)
10/16/2016  11:43 AM  PATIENT:  Janet Sherman  65 y.o. female  PRE-OPERATIVE DIAGNOSIS:  BENIGN NEOPLASM OF PITUITARY GLAND  POST-OPERATIVE DIAGNOSIS:  Benign neoplasm of pituitary gland  PROCEDURE:  Procedure(s): CRANIOTOMY HYPOPHYSECTOMY TRANSNASAL APPROACH (N/A) TRANSNASAL APPROACH WITH FUSION (N/A)  Bilateral Endoscopic Sinus Surgery  SURGEON:  Surgeon(s) and Role: Panel 1:    * Consuella Lose, MD - Primary  Panel 2:    * Jerrell Belfast, MD - Primary  PHYSICIAN ASSISTANT:   ASSISTANTS: none   ANESTHESIA:   general  EBL:  Total I/O In: 1300 [I.V.:1300] Out: 275 [Urine:200; Blood:75]  BLOOD ADMINISTERED:none  DRAINS: none   LOCAL MEDICATIONS USED:  LIDOCAINE  and Amount: 10 ml  SPECIMEN:  Source of Specimen:  Pituitary tumor and bil. sinus contents  DISPOSITION OF SPECIMEN:  PATHOLOGY  COUNTS:  YES  TOURNIQUET:  * No tourniquets in log *  DICTATION: .Other Dictation: Dictation Number [id number]  PLAN OF CARE: Admit to inpatient   PATIENT DISPOSITION:  PACU - hemodynamically stable.   Delay start of Pharmacological VTE agent (>24hrs) due to surgical blood loss or risk of bleeding: no

## 2016-10-16 NOTE — Anesthesia Preprocedure Evaluation (Addendum)
Anesthesia Evaluation  Patient identified by MRN, date of birth, ID band Patient awake    Reviewed: Allergy & Precautions, H&P , NPO status , Patient's Chart, lab work & pertinent test results  History of Anesthesia Complications Negative for: history of anesthetic complications  Airway Mallampati: II  TM Distance: >3 FB Neck ROM: full    Dental no notable dental hx. (+) Edentulous Upper, Edentulous Lower, Dental Advisory Given   Pulmonary neg pulmonary ROS, Current Smoker,    Pulmonary exam normal breath sounds clear to auscultation       Cardiovascular negative cardio ROS Normal cardiovascular exam Rhythm:regular Rate:Normal     Neuro/Psych negative neurological ROS     GI/Hepatic negative GI ROS, Neg liver ROS,   Endo/Other  negative endocrine ROS  Renal/GU negative Renal ROS     Musculoskeletal   Abdominal   Peds  Hematology negative hematology ROS (+)   Anesthesia Other Findings   Reproductive/Obstetrics negative OB ROS                            Anesthesia Physical Anesthesia Plan  ASA: II  Anesthesia Plan: General   Post-op Pain Management:    Induction: Intravenous  Airway Management Planned: Oral ETT  Additional Equipment: Arterial line  Intra-op Plan:   Post-operative Plan:   Informed Consent: I have reviewed the patients History and Physical, chart, labs and discussed the procedure including the risks, benefits and alternatives for the proposed anesthesia with the patient or authorized representative who has indicated his/her understanding and acceptance.   Dental Advisory Given  Plan Discussed with: Anesthesiologist, CRNA and Surgeon  Anesthesia Plan Comments: (2 PIVs, A line)        Anesthesia Quick Evaluation

## 2016-10-16 NOTE — Anesthesia Procedure Notes (Signed)
Procedure Name: Intubation Date/Time: 10/16/2016 8:05 AM Performed by: Suzy Bouchard Pre-anesthesia Checklist: Patient identified, Emergency Drugs available, Suction available, Timeout performed and Patient being monitored Patient Re-evaluated:Patient Re-evaluated prior to inductionOxygen Delivery Method: Circle system utilized Preoxygenation: Pre-oxygenation with 100% oxygen Intubation Type: IV induction Ventilation: Mask ventilation without difficulty Laryngoscope Size: Miller and 2 Grade View: Grade I Tube type: Oral Laser Tube: Cuffed inflated with minimal occlusive pressure - saline Tube size: 7.0 mm Number of attempts: 1 Airway Equipment and Method: Stylet Placement Confirmation: ETT inserted through vocal cords under direct vision,  positive ETCO2 and breath sounds checked- equal and bilateral Secured at: 21 cm Tube secured with: Tape Dental Injury: Teeth and Oropharynx as per pre-operative assessment

## 2016-10-16 NOTE — Anesthesia Postprocedure Evaluation (Signed)
Anesthesia Post Note  Patient: Janet Sherman  Procedure(s) Performed: Procedure(s) (LRB): CRANIOTOMY HYPOPHYSECTOMY TRANSNASAL APPROACH (N/A) TRANSNASAL APPROACH WITH FUSION (N/A)  Patient location during evaluation: PACU Anesthesia Type: General Level of consciousness: awake and alert Pain management: pain level controlled Vital Signs Assessment: post-procedure vital signs reviewed and stable Respiratory status: spontaneous breathing, nonlabored ventilation, respiratory function stable and patient connected to nasal cannula oxygen Cardiovascular status: blood pressure returned to baseline and stable Postop Assessment: no signs of nausea or vomiting Anesthetic complications: no    Last Vitals:  Vitals:   10/16/16 1150 10/16/16 1200  BP: (!) 166/91   Pulse: 62 73  Resp: 13 (!) 22  Temp: 36.5 C     Last Pain:  Vitals:   10/16/16 1150  TempSrc:   PainSc: 8                  Zenaida Deed

## 2016-10-16 NOTE — H&P (Signed)
CC:  Pituitary tumor  HPI: Janet Sherman is a 65 year old woman I'm seeing for for the above. She is referred after a recent visit to the emergency department for headaches. She says a few weeks ago, she started experiencing pressure type headaches, especially when she was leaning to the side or bending over. The headaches got progressively worse, prompting a trip to the emergency department. CT scan of the brain was done without contrast which incidentally demonstrated a pituitary mass. She was therefore referred for neurosurgical evaluation. In the interim, she says that the headaches have improved significantly. In addition to the headaches however she does also describe dizziness and lightheadedness primarily when she stands up from a seated position. She says this last for a few minutes before he goes away. She has not noticed any changes in her vision, although she does report cataracts. She has not had any imbalance or falls. She does not describe any new numbness tingling or weakness of the arms or legs.   PMH: Past Medical History:  Diagnosis Date  . Gallstones     PSH: Past Surgical History:  Procedure Laterality Date  . gallstones     15 years ago    SH: Social History  Substance Use Topics  . Smoking status: Light Tobacco Smoker    Packs/day: 0.25    Types: Cigarettes  . Smokeless tobacco: Never Used  . Alcohol use No    MEDS: Prior to Admission medications   Medication Sig Start Date End Date Taking? Authorizing Provider  acetaminophen (TYLENOL) 500 MG tablet Take 1,000 mg by mouth every 6 (six) hours as needed for mild pain.   Yes Historical Provider, MD  acetaminophen (TYLENOL) 650 MG CR tablet Take 650 mg by mouth every 8 (eight) hours as needed for pain.   Yes Historical Provider, MD  CALCIUM PO Take 1 tablet by mouth daily.   Yes Historical Provider, MD  Multiple Vitamins-Minerals (MULTIVITAMIN WITH MINERALS) tablet Take 1 tablet by mouth daily.   Yes Historical  Provider, MD  aspirin 81 MG chewable tablet Chew 1 tablet (81 mg total) by mouth daily. Patient not taking: Reported on 07/28/2016 08/10/13   Cleatrice Burke, PA-C  pregabalin (LYRICA) 75 MG capsule Take 1 capsule (75 mg total) by mouth 2 (two) times daily. Patient not taking: Reported on 07/28/2016 09/06/13   Drema Dallas, DO    ALLERGY: Allergies  Allergen Reactions  . No Known Allergies     ROS: ROS  NEUROLOGIC EXAM: Awake, alert, oriented Memory and concentration grossly intact Speech fluent, appropriate CN grossly intact Motor exam: Upper Extremities Deltoid Bicep Tricep Grip  Right 5/5 5/5 5/5 5/5  Left 5/5 5/5 5/5 5/5   Lower Extremity IP Quad PF DF EHL  Right 5/5 5/5 5/5 5/5 5/5  Left 5/5 5/5 5/5 5/5 5/5   Sensation grossly intact to LT  IMGAING: MRI of the brain with and without contrast with pituitary protocol was reviewed. This demonstrates a heterogeneously enhancing sellar-based mass which extends superiorly into the suprasellar space, and does cause bowing of the optic apparatus. There does not appear to be significant extension laterally into the cavernous sinuses.   Serum endocrine profile is essentially normal, with prolactin level of 11, a.m. cortisol is greater than 10.  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 This will require surgical resection for diagnosis and decompression of the optic apparatus.  PLAN: Proceed with endoscopic assisted transsphenoidal resection of tumor  with Dr. Wilburn Cornelia.  I have reviewed the MRI and serum endocrine studies with the patient and family in the office. Treatment options were discussed, including my recommendation for surgical resection. Rationale for surgery was discussed including decompressing the optic apparatus, and obtaining definitive pathologic diagnosis. Risks of surgery were also discussed including the risk of  carotid injury leading to catastrophic bleeding or stroke, CSF leak, infection, and endocrine dysfunction. We did also discussed the general risks of anesthesia including heart attack, stroke, and blood clots. The patient understood our discussion. She had appropriate questions which were answered to her satisfaction. She is willing to proceed with surgery.

## 2016-10-16 NOTE — Transfer of Care (Signed)
Immediate Anesthesia Transfer of Care Note  Patient: Janet Sherman  Procedure(s) Performed: Procedure(s): CRANIOTOMY HYPOPHYSECTOMY TRANSNASAL APPROACH (N/A) TRANSNASAL APPROACH WITH FUSION (N/A)  Patient Location: PACU  Anesthesia Type:General  Level of Consciousness: awake and alert   Airway & Oxygen Therapy: Patient Spontanous Breathing and Patient connected to face mask oxygen  Post-op Assessment: Report given to RN, Post -op Vital signs reviewed and stable and Patient moving all extremities X 4  Post vital signs: Reviewed and stable  Last Vitals:  Vitals:   10/16/16 0607 10/16/16 1150  BP: (!) 150/84 (!) 166/91  Pulse: (!) 53 62  Resp: 20 13  Temp: 36.5 C 36.5 C    Last Pain:  Vitals:   10/16/16 0607  TempSrc: Oral         Complications: No apparent anesthesia complications

## 2016-10-17 ENCOUNTER — Encounter (HOSPITAL_COMMUNITY): Payer: Self-pay | Admitting: Neurosurgery

## 2016-10-17 LAB — BASIC METABOLIC PANEL
ANION GAP: 7 (ref 5–15)
BUN: 8 mg/dL (ref 6–20)
CALCIUM: 8.7 mg/dL — AB (ref 8.9–10.3)
CO2: 26 mmol/L (ref 22–32)
Chloride: 107 mmol/L (ref 101–111)
Creatinine, Ser: 0.76 mg/dL (ref 0.44–1.00)
GLUCOSE: 127 mg/dL — AB (ref 65–99)
POTASSIUM: 3.3 mmol/L — AB (ref 3.5–5.1)
Sodium: 140 mmol/L (ref 135–145)

## 2016-10-17 LAB — CBC
HEMATOCRIT: 34.4 % — AB (ref 36.0–46.0)
Hemoglobin: 11.8 g/dL — ABNORMAL LOW (ref 12.0–15.0)
MCH: 27.4 pg (ref 26.0–34.0)
MCHC: 34.3 g/dL (ref 30.0–36.0)
MCV: 80 fL (ref 78.0–100.0)
PLATELETS: 441 10*3/uL — AB (ref 150–400)
RBC: 4.3 MIL/uL (ref 3.87–5.11)
RDW: 13.5 % (ref 11.5–15.5)
WBC: 18.9 10*3/uL — AB (ref 4.0–10.5)

## 2016-10-17 LAB — CORTISOL-AM, BLOOD: Cortisol - AM: 46.3 ug/dL — ABNORMAL HIGH (ref 6.7–22.6)

## 2016-10-17 MED ORDER — PNEUMOCOCCAL VAC POLYVALENT 25 MCG/0.5ML IJ INJ: 0.5000 mL | INJECTION | INTRAMUSCULAR | Status: DC

## 2016-10-17 MED ORDER — GUAIFENESIN-CODEINE 100-10 MG/5ML PO SOLN
5.0000 mL | Freq: Four times a day (QID) | ORAL | Status: DC | PRN
  Administered 2016-10-17: 5 mL via ORAL
  Filled 2016-10-17: qty 5

## 2016-10-17 NOTE — Op Note (Signed)
NAMEJULYANA, BEDRICK                ACCOUNT NO.:  [id number]  MEDICAL RECORD NO.:  [id number]  LOCATION:  3M14C                        FACILITY:  [facility]  PHYSICIAN:  Early Chars. Wilburn Cornelia, M.D.DATE OF BIRTH:  12/25/1951  DATE OF PROCEDURE:  10/16/2016 DATE OF DISCHARGE:                              OPERATIVE REPORT   LOCATION:  [facility] Neurosurgical OR.  PREOPERATIVE DIAGNOSES: 1. Pituitary tumor. 2. Bilateral chronic sinus disease.  SURGEONS: 1. Neurosurgeon is Dr. Kathyrn Sheriff. 2. ENT surgeon, Dr. Wilburn Cornelia.  COMPLICATIONS:  None.  ESTIMATED BLOOD LOSS:  Less than 150 mL.  The patient was transferred from the operating room to the recovery room in stable condition.  SURGICAL PROCEDURE: 1. Endoscopic transsphenoidal pituitary resection. 2. Bilateral endoscopic sinus surgery with intraoperative computer     navigation consisting of bilateral total ethmoidectomy, bilateral     maxillary antrostomy with removal of diseased tissue, and bilateral     sphenoidotomy.  ANESTHESIA:  General endotracheal.  BRIEF HISTORY:  The patient is a 65 year old black female, referred to Dr. Kathyrn Sheriff for evaluation of a pituitary mass found on MRI scan.  The patient had no significant visual abnormalities or hormonal dysfunction. MRI scan showed a large heterogeneous mass in the pituitary extending superiorly to the level of the optic chiasm.  The patient was referred to ENT for evaluation for a surgical approach and a CT scan with navigation was obtained.  This showed diffuse mucosal disease involving the ethmoid and maxillary sinuses bilaterally with obstruction of the ostiomeatal complex and chronic-appearing sinonasal disease in the maxillary sinuses.  Given the patient's history and findings, I recommended endoscopic transsphenoidal pituitary resection.  The risks and benefits of the ENT component of the surgical procedure were discussed preoperatively with the patient and her  daughter and they understood and agreed with our plan for surgery which was scheduled on elective basis.  Because of the patient's chronic appearing sinus disease, I also recommended incorporating maxillary sinus surgery as a separate procedure with the approach for the pituitary resection.  They understood and agreed with our plan for surgery which is scheduled on elective basis at [facility] on October 16, 2016.  DESCRIPTION OF PROCEDURE:  The patient was brought to the operating room, placed in supine position on the operating table.  General endotracheal anesthesia was established without difficulty.  When the patient was adequately anesthetized, she was positioned on the operating table and prepped and draped in a sterile fashion.  A surgical time-out was then performed for correct identification of the patient and the surgical procedure.  Her nose was then injected with a total of 10 mL of 1% lidocaine with 1:100,000 dilution epinephrine which was injected in submucosal fashion along the middle turbinate, lateral nasal wall, posterior nasal septum, and anterior face of the sphenoid bilaterally. She was also injected via a transoral sphenopalatine block.  Nose then packed with Afrin-soaked cottonoid pledgets were left in place for approximately 10 minutes to allow for vasoconstriction and hemostasis. The Xomed fusion headgear was applied and anatomic and surgical landmarks were identified and confirmed.  The fusion device was used throughout the sinus component of the surgical procedure.  The patient  was prepped, draped, and prepared for surgery.  Bilateral endoscopic sinus surgeries were undertaken, beginning on the patient's left-hand side using a 0-degree endoscope and a straight microdebrider. A total ethmoidectomy was performed.  The middle turbinate was carefully medialized.  The uncinate process was resected with a through-cutting forceps and dissection was carried  from anterior to posterior along the floor of the ethmoid sinus dissecting the ethmoid bulla superiorly and removing bony septations creating a widely patent ethmoid cavity.  In the posterior aspect of the ethmoid sinus, the posterior component of the middle turbinate was transected.  The inferior component of the superior turbinate was then resected with a microdebrider and the natural ostium of the sphenoid sinus was identified.  This was enlarged in an inferior and lateral direction creating a widely patent sphenoidotomy.  The patient's left maxillary sinus was then treated with resection of the residual uncinate process using a 45 degree telescope. The lateral wall of the ethmoid cavity was visualized.  Natural ostium of the maxillary sinus was identified, and this was enlarged in an inferior and posterior direction.  Within the sinus, there was polypoid mucosa which was resected with a curved microdebrider.  Attention was then turned to the patient's right-hand side beginning with a 0-degree telescope and a straight microdebrider.  The middle turbinate was carefully medialized and the uncinate process was resected.  Dissection was then carried from anterior to posterior, performing a total ethmoidectomy.  No evidence of infection or polypoid disease.  The posterior component of the sphenoethmoid recess was explored and the posterior middle turbinate was crossed and the natural ostium of the sphenoid sinus was identified.  The inferior component of the superior turbinate was then resected with the microdebrider and the natural ostium was enlarged laterally and inferiorly creating direct access to the sphenoid sinus on the right.  Attention was then turned to the right lateral nasal wall and using a 45 degree telescope and a curved microdebrider, the natural ostium of the maxillary sinus was identified and then enlarged in an inferior and posterior direction. Within the sinus, there  was polypoid material which was fully resected, creating a widely patent maxillary ostium.  With bilateral sinus surgery completed, the surgical approach was taken further by transecting the posterior aspect of the nasal septum. Using a through-cutting forceps, dissection was carried out from anterior to posterior through the anterior face of the sphenoid combining the bilateral sphenoid sinus ostia and then using a through-cutting forceps taking down the inner sinus septum, creating a widely patent direct access to the sphenoid sinus and pituitary fossa. At this point, Dr. Kathyrn Sheriff performed the neurosurgical resection of the pituitary tumor which was dictated as a separate operative note.  With complete tumor resection, there was no evidence of significant bleeding or spinal fluid leak and reconstruction of the anterior face of the pituitary fossa was undertaken as described by Dr. Kathyrn Sheriff.  The sinus was then carefully inspected and surgical debris was clear.  There was no active bleeding.  The sinus was then packed with Nasopore absorbable sinus packing placed in the sphenoid sinus as well as in the posterior ethmoid sinuses bilaterally.  The patient's nasal cavity was irrigated and suctioned and an orogastric tube was passed and stomach contents were aspirated.  The patient was then awakened from anesthetic.  She was extubated and transferred from the operating room to the recovery room in stable condition.  There were no complications.          ______________________________  Early Chars. Wilburn Cornelia, M.D.     DLS/MEDQ  D:  [id number]  T:  10/17/2016  Job:  [id number]

## 2016-10-17 NOTE — Progress Notes (Signed)
Patient ID: Janet Sherman, female   DOB: 14-Mar-1951, 65 y.o.   MRN: [id number]  BP (!) 101/55   Pulse (!) 51   Temp 99 F (37.2 C) (Oral)   Resp 17   Ht 5\' 5"  (1.651 m)   Wt 48.1 kg (106 lb 0.7 oz)   SpO2 99%   BMI 17.65 kg/m  Alert and oriented x 4, speech is clean and fluent Moving all extremities well Perrl, full eom Will stop iv. No evidence DI

## 2016-10-18 LAB — CORTISOL-AM, BLOOD: Cortisol - AM: 22.7 ug/dL — ABNORMAL HIGH (ref 6.7–22.6)

## 2016-10-18 MED ORDER — ACETAMINOPHEN-CODEINE #3 300-30 MG PO TABS: 1.0000 | ORAL_TABLET | Freq: Four times a day (QID) | ORAL | 0 refills | Status: DC | PRN

## 2016-10-18 MED ORDER — SALINE SPRAY 0.65 % NA SOLN: 4.0000 | NASAL | 0 refills | Status: DC | PRN

## 2016-10-18 NOTE — Discharge Summary (Signed)
Physician Discharge Summary  Patient ID: Janet Sherman MRN: [id number] DOB/AGE: Aug 26, 1951 65 y.o.  Admit date: 10/16/2016 Discharge date: 10/18/2016  Admission Diagnoses:Pituitary adenoma with suprasellar extension  Discharge Diagnoses:  Active Problems:   Pituitary adenoma with extrasellar extension [facility])   Discharged Condition: good  Hospital Course: Janet Sherman was admitted and taken to the operating room for an uncomplicated pituitary tumor resection. Post op she has done very well. Visual fields are not impaired. She is ambulating, voiding, and tolerating a regular diet.   Treatments: surgery: Transsphenoidal pituitary tumor resection.   Discharge Exam: Blood pressure 129/79, pulse (!) 56, temperature 98.4 F (36.9 C), temperature source Oral, resp. rate 14, height 5\' 5"  (1.651 m), weight 48.1 kg (106 lb 0.7 oz), SpO2 100 %. General appearance: alert, cooperative, appears stated age and no distress Neurologic: Alert and oriented X 3, normal strength and tone. Normal symmetric reflexes. Normal coordination and gait  Disposition: 01-Home or Self Care BENIGN NEOPLASM OF PITUITARY GLAND Discharge Instructions    Diet - low sodium heart healthy    Complete by:  As directed    Discharge instructions    Complete by:  As directed    Sinus Instructions: 1. Limited activity 2. Liquid and soft diet 3. May bathe and shower 4. Saline nasal spray - 4 puffs/nostril every hour while awake 5. Elevate Head of Bed 6. No nose blowing/Open mouth sneeze   Increase activity slowly    Complete by:  As directed        Medication List    TAKE these medications   acetaminophen 650 MG CR tablet Commonly known as:  TYLENOL Take 650 mg by mouth every 8 (eight) hours as needed for pain.   acetaminophen 500 MG tablet Commonly known as:  TYLENOL Take 1,000 mg by mouth every 6 (six) hours as needed for mild pain.   acetaminophen-codeine 300-30 MG tablet Commonly known as:  TYLENOL  #3 Take 1 tablet by mouth every 6 (six) hours as needed for moderate pain.   aspirin 81 MG chewable tablet Chew 1 tablet (81 mg total) by mouth daily.   CALCIUM PO Take 1 tablet by mouth daily.   multivitamin with minerals tablet Take 1 tablet by mouth daily.   pregabalin 75 MG capsule Commonly known as:  LYRICA Take 1 capsule (75 mg total) by mouth 2 (two) times daily.   sodium chloride 0.65 % Soln nasal spray Commonly known as:  OCEAN Place 4 sprays into both nostrils every hour as needed for congestion.      Follow-up Information    SHOEMAKER, DAVID, MD.   Specialty:  Otolaryngology Contact information: [address] [place] [address] [place] [address] [phone]        Jairo Ben, MD .   Specialty:  Neurosurgery Why:  please call the office to make an appointment for 2 weeks Contact information: [address] [place] [address] [phone]           Signed: Winfield Cunas 10/18/2016, 9:39 AM

## 2016-10-18 NOTE — Progress Notes (Signed)
Patient DC home via car with family.  DC instructions and prescription given to patient and both understood.  Vital signs and assessments were stable.

## 2016-12-24 ENCOUNTER — Encounter (HOSPITAL_COMMUNITY): Payer: Self-pay | Admitting: Family Medicine

## 2016-12-24 ENCOUNTER — Ambulatory Visit (HOSPITAL_COMMUNITY)
Admission: EM | Admit: 2016-12-24 | Discharge: 2016-12-24 | Disposition: A | Discharge status: Home / Self Care | Payer: BLUE CROSS/BLUE SHIELD | Attending: Family Medicine | Admitting: Family Medicine

## 2016-12-24 DIAGNOSIS — W57XXXA Bitten or stung by nonvenomous insect and other nonvenomous arthropods, initial encounter: Secondary | ICD-10-CM | POA: Diagnosis not present

## 2016-12-24 DIAGNOSIS — B029 Zoster without complications: Secondary | ICD-10-CM

## 2016-12-24 MED ORDER — FLUTICASONE PROPIONATE 0.05 % EX CREA: TOPICAL_CREAM | Freq: Two times a day (BID) | CUTANEOUS | 1 refills | Status: DC

## 2016-12-24 NOTE — ED Triage Notes (Signed)
Pt here for vesicular rash to right flank area x 1 week.

## 2016-12-24 NOTE — ED Provider Notes (Signed)
[place]    CSN: [id number] Arrival date & time: 12/24/16  1525     History   Chief Complaint Chief Complaint  Patient presents with  . Rash    HPI Janet Sherman is a 65 y.o. female.   The history is provided by the patient.  Rash  Location:  Torso, head/neck and shoulder/arm Shoulder/arm rash location:  R upper arm and R forearm Torso rash location:  Abd RLQ Quality: blistering and itchiness   Severity:  Mild Onset quality:  Gradual Duration:  3 weeks Progression:  Spreading Chronicity:  New Context: insect bite/sting   Context comment:  Vesicular flank rash. Relieved by:  Anti-fungal cream Associated symptoms: no abdominal pain and no fever     Past Medical History:  Diagnosis Date  . Gallstones     Patient Active Problem List   Diagnosis Date Noted  . Pituitary adenoma with extrasellar extension ([place]) 10/16/2016  . HYPERCHOLESTEROLEMIA 09/13/2009  . TOBACCO ABUSE 05/22/2009  . SINUSITIS 05/22/2009  . BREAST MASS, LEFT 04/20/2008  . OSTEOPENIA 04/20/2008  . NEPHROLITHIASIS 03/19/2008  . POSTMENOPAUSAL SYNDROME 03/19/2008  . HEMATURIA UNSPECIFIED 02/21/2008    Past Surgical History:  Procedure Laterality Date  . CRANIOTOMY N/A 10/16/2016   Procedure: CRANIOTOMY HYPOPHYSECTOMY TRANSNASAL APPROACH;  Surgeon: Consuella Lose, MD;  Location: [facility];  Service: Neurosurgery;  Laterality: N/A;  . gallstones     15 years ago  . TRANSNASAL APPROACH N/A 10/16/2016   Procedure: TRANSNASAL APPROACH WITH FUSION;  Surgeon: Jerrell Belfast, MD;  Location: [facility];  Service: ENT;  Laterality: N/A;    OB History    No data available       Home Medications    Prior to Admission medications   Medication Sig Start Date End Date Taking? Authorizing Provider  acetaminophen (TYLENOL) 500 MG tablet Take 1,000 mg by mouth every 6 (six) hours as needed for mild pain.    Historical Provider, MD  acetaminophen (TYLENOL) 650 MG CR tablet Take 650 mg by  mouth every 8 (eight) hours as needed for pain.    Historical Provider, MD  acetaminophen-codeine (TYLENOL #3) 300-30 MG tablet Take 1 tablet by mouth every 6 (six) hours as needed for moderate pain. 10/18/16   Ashok Pall, MD  aspirin 81 MG chewable tablet Chew 1 tablet (81 mg total) by mouth daily. Patient not taking: Reported on 07/28/2016 08/10/13   Cleatrice Burke, PA-C  CALCIUM PO Take 1 tablet by mouth daily.    Historical Provider, MD  Multiple Vitamins-Minerals (MULTIVITAMIN WITH MINERALS) tablet Take 1 tablet by mouth daily.    Historical Provider, MD  pregabalin (LYRICA) 75 MG capsule Take 1 capsule (75 mg total) by mouth 2 (two) times daily. Patient not taking: Reported on 07/28/2016 09/06/13   Drema Dallas, DO  sodium chloride (OCEAN) 0.65 % SOLN nasal spray Place 4 sprays into both nostrils every hour as needed for congestion. 10/18/16   Ashok Pall, MD    Family History Family History  Problem Relation Age of Onset  . Hypertension Mother   . Cancer Father     lung  . Hypertension Sister   . Hypertension Brother     Social History Social History  Substance Use Topics  . Smoking status: Light Tobacco Smoker    Packs/day: 0.25    Types: Cigarettes  . Smokeless tobacco: Never Used  . Alcohol use No     Allergies   No known allergies   Review of Systems Review  of Systems  Constitutional: Negative for fever.  Gastrointestinal: Negative.  Negative for abdominal pain.  Genitourinary: Negative.   Skin: Positive for rash.  All other systems reviewed and are negative.    Physical Exam Triage Vital Signs ED Triage Vitals  Enc Vitals Group     BP 12/24/16 1640 160/100     Pulse Rate 12/24/16 1640 80     Resp 12/24/16 1640 18     Temp 12/24/16 1640 98.3 F (36.8 C)     Temp Source 12/24/16 1640 Oral     SpO2 12/24/16 1640 100 %     Weight --      Height --      Head Circumference --      Peak Flow --      Pain Score 12/24/16 1641 2     Pain Loc --       Pain Edu? --      Excl. in [place]? --    No data found.   Updated Vital Signs BP 160/100   Pulse 80   Temp 98.3 F (36.8 C) (Oral)   Resp 18   SpO2 100%   Visual Acuity Right Eye Distance:   Left Eye Distance:   Bilateral Distance:    Right Eye Near:   Left Eye Near:    Bilateral Near:     Physical Exam  Constitutional: She is oriented to person, place, and time. She appears well-developed and well-nourished. No distress.  Neurological: She is alert and oriented to person, place, and time.  Skin: Skin is warm and dry. Rash noted. There is erythema.  Grouped vesicular patchy rash to t12 dermatome with assoc papular rash on neck and right arm.  Nursing note and vitals reviewed.    UC Treatments / Results  Labs (all labs ordered are listed, but only abnormal results are displayed) Labs Reviewed - No data to display  EKG  EKG Interpretation None       Radiology No results found.  Procedures Procedures (including critical care time)  Medications Ordered in UC Medications - No data to display   Initial Impression / Assessment and Plan / UC Course  I have reviewed the triage vital signs and the nursing notes.  Pertinent labs & imaging results that were available during my care of the patient were reviewed by me and considered in my medical decision making (see chart for details).  Clinical Course       Final Clinical Impressions(s) / UC Diagnoses   Final diagnoses:  None    New Prescriptions New Prescriptions   No medications on file     Billy Fischer, MD 12/24/16 2103

## 2016-12-25 ENCOUNTER — Observation Stay (HOSPITAL_COMMUNITY)
Admission: EM | Admit: 2016-12-25 | Discharge: 2016-12-26 | Disposition: A | Discharge status: Home / Self Care | Payer: BLUE CROSS/BLUE SHIELD | Attending: Internal Medicine | Admitting: Internal Medicine

## 2016-12-25 ENCOUNTER — Encounter (HOSPITAL_COMMUNITY): Payer: Self-pay

## 2016-12-25 ENCOUNTER — Emergency Department (HOSPITAL_COMMUNITY): Payer: BLUE CROSS/BLUE SHIELD

## 2016-12-25 DIAGNOSIS — I081 Rheumatic disorders of both mitral and tricuspid valves: Secondary | ICD-10-CM | POA: Insufficient documentation

## 2016-12-25 DIAGNOSIS — I471 Supraventricular tachycardia, unspecified: Secondary | ICD-10-CM | POA: Diagnosis present

## 2016-12-25 DIAGNOSIS — R778 Other specified abnormalities of plasma proteins: Secondary | ICD-10-CM | POA: Diagnosis present

## 2016-12-25 DIAGNOSIS — B029 Zoster without complications: Secondary | ICD-10-CM | POA: Diagnosis present

## 2016-12-25 DIAGNOSIS — Z87891 Personal history of nicotine dependence: Secondary | ICD-10-CM | POA: Diagnosis present

## 2016-12-25 DIAGNOSIS — Z7982 Long term (current) use of aspirin: Secondary | ICD-10-CM | POA: Insufficient documentation

## 2016-12-25 DIAGNOSIS — R7989 Other specified abnormal findings of blood chemistry: Secondary | ICD-10-CM | POA: Diagnosis present

## 2016-12-25 DIAGNOSIS — F17200 Nicotine dependence, unspecified, uncomplicated: Secondary | ICD-10-CM | POA: Diagnosis not present

## 2016-12-25 DIAGNOSIS — E7800 Pure hypercholesterolemia, unspecified: Secondary | ICD-10-CM | POA: Diagnosis not present

## 2016-12-25 DIAGNOSIS — F17210 Nicotine dependence, cigarettes, uncomplicated: Secondary | ICD-10-CM | POA: Insufficient documentation

## 2016-12-25 DIAGNOSIS — R079 Chest pain, unspecified: Secondary | ICD-10-CM

## 2016-12-25 DIAGNOSIS — R9431 Abnormal electrocardiogram [ECG] [EKG]: Secondary | ICD-10-CM | POA: Diagnosis not present

## 2016-12-25 LAB — I-STAT CHEM 8, ED
BUN: 12 mg/dL (ref 6–20)
CALCIUM ION: 1.24 mmol/L (ref 1.15–1.40)
CHLORIDE: 106 mmol/L (ref 101–111)
CREATININE: 0.9 mg/dL (ref 0.44–1.00)
GLUCOSE: 123 mg/dL — AB (ref 65–99)
HCT: 42 % (ref 36.0–46.0)
Hemoglobin: 14.3 g/dL (ref 12.0–15.0)
Potassium: 3.6 mmol/L (ref 3.5–5.1)
Sodium: 142 mmol/L (ref 135–145)
TCO2: 28 mmol/L (ref 0–100)

## 2016-12-25 LAB — CBC
HEMATOCRIT: 41.5 % (ref 36.0–46.0)
Hemoglobin: 13.9 g/dL (ref 12.0–15.0)
MCH: 26.8 pg (ref 26.0–34.0)
MCHC: 33.5 g/dL (ref 30.0–36.0)
MCV: 80 fL (ref 78.0–100.0)
Platelets: 571 10*3/uL — ABNORMAL HIGH (ref 150–400)
RBC: 5.19 MIL/uL — ABNORMAL HIGH (ref 3.87–5.11)
RDW: 13.1 % (ref 11.5–15.5)
WBC: 9.5 10*3/uL (ref 4.0–10.5)

## 2016-12-25 LAB — BASIC METABOLIC PANEL
Anion gap: 10 (ref 5–15)
BUN: 10 mg/dL (ref 6–20)
CALCIUM: 9.9 mg/dL (ref 8.9–10.3)
CO2: 24 mmol/L (ref 22–32)
Chloride: 106 mmol/L (ref 101–111)
Creatinine, Ser: 0.87 mg/dL (ref 0.44–1.00)
GFR calc Af Amer: >60 mL/min (ref >60)
GLUCOSE: 122 mg/dL — AB (ref 65–99)
POTASSIUM: 3.6 mmol/L (ref 3.5–5.1)
Sodium: 140 mmol/L (ref 135–145)

## 2016-12-25 LAB — I-STAT TROPONIN, ED: Troponin i, poc: 0.06 ng/mL (ref 0.00–0.08)

## 2016-12-25 LAB — I-STAT CG4 LACTIC ACID, ED: LACTIC ACID, VENOUS: 1.14 mmol/L (ref 0.5–1.9)

## 2016-12-25 LAB — MAGNESIUM: Magnesium: 2 mg/dL (ref 1.7–2.4)

## 2016-12-25 MED ORDER — VALACYCLOVIR HCL 1 G PO TABS: 1000.0000 mg | ORAL_TABLET | Freq: Three times a day (TID) | ORAL | 0 refills | Status: DC

## 2016-12-25 MED ORDER — SODIUM CHLORIDE 0.9 % IV BOLUS (SEPSIS)
1000.0000 mL | Freq: Once | INTRAVENOUS | Status: AC
  Administered 2016-12-25: 1000 mL via INTRAVENOUS

## 2016-12-25 MED ORDER — HYDROCODONE-ACETAMINOPHEN 5-325 MG PO TABS
2.0000 | ORAL_TABLET | Freq: Once | ORAL | Status: AC
  Administered 2016-12-25: 2 via ORAL
  Filled 2016-12-25: qty 2

## 2016-12-25 MED ORDER — PREDNISONE 20 MG PO TABS: 20.0000 mg | ORAL_TABLET | Freq: Every day | ORAL | 0 refills | Status: DC

## 2016-12-25 MED ORDER — DILTIAZEM HCL 25 MG/5ML IV SOLN
5.0000 mg | Freq: Once | INTRAVENOUS | Status: AC
  Administered 2016-12-25: 2.5 mg via INTRAVENOUS
  Filled 2016-12-25: qty 5

## 2016-12-25 MED ORDER — VALACYCLOVIR HCL 500 MG PO TABS
1000.0000 mg | ORAL_TABLET | Freq: Once | ORAL | Status: AC
  Administered 2016-12-25: 1000 mg via ORAL
  Filled 2016-12-25: qty 2

## 2016-12-25 MED ORDER — DILTIAZEM HCL 25 MG/5ML IV SOLN
5.0000 mg | Freq: Once | INTRAVENOUS | Status: AC
  Administered 2016-12-25: 5 mg via INTRAVENOUS
  Filled 2016-12-25: qty 5

## 2016-12-25 MED ORDER — HYDROCODONE-ACETAMINOPHEN 5-325 MG PO TABS: 1.0000 | ORAL_TABLET | ORAL | 0 refills | Status: DC | PRN

## 2016-12-25 MED ORDER — DILTIAZEM HCL 25 MG/5ML IV SOLN: 10.0000 mg | Freq: Once | INTRAVENOUS | Status: DC

## 2016-12-25 MED ORDER — PREDNISONE 20 MG PO TABS
40.0000 mg | ORAL_TABLET | Freq: Once | ORAL | Status: AC
  Administered 2016-12-25: 40 mg via ORAL
  Filled 2016-12-25: qty 2

## 2016-12-25 NOTE — ED Triage Notes (Signed)
Pt complaining of shingles to R flank. Pt states seen at [facility] yesterday, given cream, no relief. Pt with multiple draining vesicles to R flank. Pt tachy at triage. Pt complaining generalized fatigue, some lightheadedness.

## 2016-12-25 NOTE — ED Provider Notes (Signed)
[facility] DEPT Provider Note   CSN: [id number] Arrival date & time: 12/25/16  1942     History   Chief Complaint Chief Complaint  Patient presents with  . Herpes Zoster  . Tachycardia    HPI Janet Sherman is a 65 y.o. female.  HPI 65 year old female who presents with generalized fatigue in the setting of shingles outbreak. Patient states that for the last week, she has had progressively worsening and spreading right sided abdominal and flank rash. She seen in urgent care yesterday and diagnosed with shingles and sent home with topical cream. She states that since then, she has felt intermittently, generalized fatigue and dizziness upon standing. Denies any fevers. Her rash has also worsened and should she now has severe, 8 out of 10 pain. Denies any chest pain. Denies any headaches. Denies any recent illnesses.  Past Medical History:  Diagnosis Date  . Gallstones     Patient Active Problem List   Diagnosis Date Noted  . Pituitary adenoma with extrasellar extension ([place]) 10/16/2016  . HYPERCHOLESTEROLEMIA 09/13/2009  . TOBACCO ABUSE 05/22/2009  . SINUSITIS 05/22/2009  . BREAST MASS, LEFT 04/20/2008  . OSTEOPENIA 04/20/2008  . NEPHROLITHIASIS 03/19/2008  . POSTMENOPAUSAL SYNDROME 03/19/2008  . HEMATURIA UNSPECIFIED 02/21/2008    Past Surgical History:  Procedure Laterality Date  . CRANIOTOMY N/A 10/16/2016   Procedure: CRANIOTOMY HYPOPHYSECTOMY TRANSNASAL APPROACH;  Surgeon: Consuella Lose, MD;  Location: [facility];  Service: Neurosurgery;  Laterality: N/A;  . gallstones     15 years ago  . TRANSNASAL APPROACH N/A 10/16/2016   Procedure: TRANSNASAL APPROACH WITH FUSION;  Surgeon: Jerrell Belfast, MD;  Location: [facility];  Service: ENT;  Laterality: N/A;    OB History    No data available       Home Medications    Prior to Admission medications   Medication Sig Start Date End Date Taking? Authorizing Provider  acetaminophen (TYLENOL) 500 MG tablet Take 1,000  mg by mouth every 6 (six) hours as needed for mild pain.    Historical Provider, MD  acetaminophen (TYLENOL) 650 MG CR tablet Take 650 mg by mouth every 8 (eight) hours as needed for pain.    Historical Provider, MD  acetaminophen-codeine (TYLENOL #3) 300-30 MG tablet Take 1 tablet by mouth every 6 (six) hours as needed for moderate pain. 10/18/16   Ashok Pall, MD  aspirin 81 MG chewable tablet Chew 1 tablet (81 mg total) by mouth daily. Patient not taking: Reported on 07/28/2016 08/10/13   Cleatrice Burke, PA-C  CALCIUM PO Take 1 tablet by mouth daily.    Historical Provider, MD  fluticasone (CUTIVATE) 0.05 % cream Apply topically 2 (two) times daily. 12/24/16   Billy Fischer, MD  HYDROcodone-acetaminophen (NORCO/VICODIN) 5-325 MG tablet Take 1-2 tablets by mouth every 4 (four) hours as needed for moderate pain or severe pain. 12/25/16   Duffy Bruce, MD  Multiple Vitamins-Minerals (MULTIVITAMIN WITH MINERALS) tablet Take 1 tablet by mouth daily.    Historical Provider, MD  predniSONE (DELTASONE) 20 MG tablet Take 1 tablet (20 mg total) by mouth daily. 12/25/16 12/30/16  Duffy Bruce, MD  pregabalin (LYRICA) 75 MG capsule Take 1 capsule (75 mg total) by mouth 2 (two) times daily. Patient not taking: Reported on 07/28/2016 09/06/13   Drema Dallas, DO  sodium chloride (OCEAN) 0.65 % SOLN nasal spray Place 4 sprays into both nostrils every hour as needed for congestion. 10/18/16   Ashok Pall, MD  valACYclovir (VALTREX) 1000 MG tablet Take  1 tablet (1,000 mg total) by mouth 3 (three) times daily. 12/25/16 01/01/17  Duffy Bruce, MD    Family History Family History  Problem Relation Age of Onset  . Hypertension Mother   . Cancer Father     lung  . Hypertension Sister   . Hypertension Brother     Social History Social History  Substance Use Topics  . Smoking status: Light Tobacco Smoker    Packs/day: 0.25    Types: Cigarettes  . Smokeless tobacco: Never Used  . Alcohol use No      Allergies   No known allergies   Review of Systems Review of Systems  Constitutional: Positive for fatigue. Negative for chills and fever.  HENT: Negative for congestion, rhinorrhea and sore throat.   Eyes: Negative for visual disturbance.  Respiratory: Negative for cough, shortness of breath and wheezing.   Cardiovascular: Positive for palpitations. Negative for chest pain and leg swelling.  Gastrointestinal: Negative for abdominal pain, diarrhea, nausea and vomiting.  Genitourinary: Negative for dysuria, flank pain, vaginal bleeding and vaginal discharge.  Musculoskeletal: Negative for neck pain.  Skin: Positive for rash and wound.  Allergic/Immunologic: Negative for immunocompromised state.  Neurological: Positive for weakness. Negative for syncope and headaches.  Hematological: Does not bruise/bleed easily.  All other systems reviewed and are negative.    Physical Exam Updated Vital Signs BP 124/86   Pulse 79   Temp 98 F (36.7 C) (Oral)   Resp 19   SpO2 99%   Physical Exam  Constitutional: She appears well-developed and well-nourished.  HENT:  Head: Normocephalic and atraumatic.  Mouth/Throat: No oropharyngeal exudate.  Dry MM  Eyes: Conjunctivae are normal. Pupils are equal, round, and reactive to light.  Neck: Neck supple.  Cardiovascular: Regular rhythm and normal heart sounds.  Tachycardia present.  Exam reveals no friction rub.   No murmur heard. Pulmonary/Chest: Effort normal and breath sounds normal. No respiratory distress. She has no wheezes. She has no rales.  Abdominal: Soft. She exhibits no distension. There is no tenderness.  Musculoskeletal: She exhibits no edema.  Neurological: She is alert.  Skin: Skin is warm. Capillary refill takes less than 2 seconds.  Grouped vesicles along T11/T12 distribution with erythematous base, clear vesicles, and secondary excoriations. Rash does not cross midline. Small papular rash to left posterior axilla as  well, without vesicles.  Nursing note and vitals reviewed.    ED Treatments / Results  Labs (all labs ordered are listed, but only abnormal results are displayed) Labs Reviewed  BASIC METABOLIC PANEL - Abnormal; Notable for the following:       Result Value   Glucose, Bld 122 (*)    All other components within normal limits  CBC - Abnormal; Notable for the following:    RBC 5.19 (*)    Platelets 571 (*)    All other components within normal limits  I-STAT CHEM 8, ED - Abnormal; Notable for the following:    Glucose, Bld 123 (*)    All other components within normal limits  MAGNESIUM  TSH  T4, FREE  I-STAT TROPOININ, ED  I-STAT CG4 LACTIC ACID, ED    EKG  EKG Interpretation  Date/Time:  Friday December 25 2016 21:26:28 EST Ventricular Rate:  86 PR Interval:    QRS Duration: 93 QT Interval:  392 QTC Calculation: 469 R Axis:   -3 Text Interpretation:  Sinus rhythm Anteroseptal infarct, age indeterminate Since last tracing, sinus rhythm has repalced SVT Confirmed by Ellender Hose MD, Elimelech Houseman (  [id number]) on 12/25/2016 10:52:23 PM       Radiology Dg Chest Port 1 View  Result Date: 12/25/2016 CLINICAL DATA:  RIGHT flank shingles. Generalized fatigue and lightheadedness. EXAM: PORTABLE CHEST 1 VIEW COMPARISON:  None. FINDINGS: The heart size and mediastinal contours are within normal limits. Both lungs are clear. The visualized skeletal structures are unremarkable. IMPRESSION: Normal chest radiograph. Electronically Signed   By: Elon Alas M.D.   On: 12/25/2016 22:19    Procedures Procedures (including critical care time)  Medications Ordered in ED Medications  diltiazem (CARDIZEM) injection 5 mg (not administered)  sodium chloride 0.9 % bolus 1,000 mL (0 mLs Intravenous Stopped 12/25/16 2210)  diltiazem (CARDIZEM) injection 5 mg (2.5 mg Intravenous Given 12/25/16 2123)  HYDROcodone-acetaminophen (NORCO/VICODIN) 5-325 MG per tablet 2 tablet (2 tablets Oral Given 12/25/16  2221)  valACYclovir (VALTREX) tablet 1,000 mg (1,000 mg Oral Given 12/25/16 2246)  predniSONE (DELTASONE) tablet 40 mg (40 mg Oral Given 12/25/16 2245)     Initial Impression / Assessment and Plan / ED Course  I have reviewed the triage vital signs and the nursing notes.  Pertinent labs & imaging results that were available during my care of the patient were reviewed by me and considered in my medical decision making (see chart for details).  Clinical Course     65 year old female with past medical history as above who presents with generalized fatigue in setting of significant right-sided shingles. On arrival, patient noted to be in supraventricular tachycardia with ventricular rate of 160. She was given diltiazem 5 mg with conversion to normal sinus rhythm. Otherwise, lab work obtained and is unremarkable. Suspect this may be secondary to pain in the setting of shingles as well as dehydration. I've discussed with cardiology who initially recommended discharge. However, upon getting ready to discharge, patient noted to reenter a junctional tachycardia. Diltiazem bolus given again per cardiology and will admit for observation. They recommend pain control as well as echocardiogram in the morning.  Final Clinical Impressions(s) / ED Diagnoses   Final diagnoses:  Herpes zoster without complication  SVT (supraventricular tachycardia) (HCC)     Duffy Bruce, MD 12/25/16 2337

## 2016-12-25 NOTE — ED Notes (Signed)
ED Provider at bedside. 

## 2016-12-25 NOTE — Consult Note (Signed)
Chief Complaint/Reason for Consult: tachycardia   Requesting Physician: ED Ellender Hose)   PCP:  No PCP Per Patient Primary Cardiologist:N/A  HPI:   65 YO female with history of recent craniotomy for brain tumor, previous gallstones presents for treatment of shingles.  In ED triage, was found to be tachycardic in SVT which broke with diltiazem 5 mg IV x 1.  Later, resumed tachycardia as well.  Overall feels well; no complaints other than some mild palpitations during the tachycardia.  Main complaints are concerning the discomfort of her shingles.   She denies thyroid disease.  She notes that she drinks considerable amount of coffee (4-5 cups/day) and soda up to 3 as well.  She denies feeling many palpitations before today.  No chest pain, significant SOB, edema, PND, orthopnea or other concerning signs or symptoms.    On interview, she is in NSR, comfortable appearing.  Later in the night; resumed tachycardia in ED.      Past Medical History:  Diagnosis Date  . Gallstones     Past Surgical History:  Procedure Laterality Date  . CRANIOTOMY N/A 10/16/2016   Procedure: CRANIOTOMY HYPOPHYSECTOMY TRANSNASAL APPROACH;  Surgeon: Consuella Lose, MD;  Location: [facility];  Service: Neurosurgery;  Laterality: N/A;  . gallstones     15 years ago  . TRANSNASAL APPROACH N/A 10/16/2016   Procedure: TRANSNASAL APPROACH WITH FUSION;  Surgeon: Jerrell Belfast, MD;  Location: [facility] OR;  Service: ENT;  Laterality: N/A;    Family History  Problem Relation Age of Onset  . Hypertension Mother   . Cancer Father     lung  . Hypertension Sister   . Hypertension Brother    Social History:  reports that she has been smoking Cigarettes.  She has been smoking about 0.25 packs per day. She has never used smokeless tobacco. She reports that she does not drink alcohol or use drugs.  Allergies:  Allergies  Allergen Reactions  . No Known Allergies     No current facility-administered medications on file  prior to encounter.    Current Outpatient Prescriptions on File Prior to Encounter  Medication Sig Dispense Refill  . acetaminophen (TYLENOL) 500 MG tablet Take 1,000 mg by mouth every 6 (six) hours as needed for mild pain.    Marland Kitchen acetaminophen (TYLENOL) 650 MG CR tablet Take 650 mg by mouth every 8 (eight) hours as needed for pain.    Marland Kitchen acetaminophen-codeine (TYLENOL #3) 300-30 MG tablet Take 1 tablet by mouth every 6 (six) hours as needed for moderate pain. 40 tablet 0  . aspirin 81 MG chewable tablet Chew 1 tablet (81 mg total) by mouth daily. (Patient not taking: Reported on 07/28/2016) 30 tablet 0  . CALCIUM PO Take 1 tablet by mouth daily.    . fluticasone (CUTIVATE) 0.05 % cream Apply topically 2 (two) times daily. 30 g 1  . Multiple Vitamins-Minerals (MULTIVITAMIN WITH MINERALS) tablet Take 1 tablet by mouth daily.    . pregabalin (LYRICA) 75 MG capsule Take 1 capsule (75 mg total) by mouth 2 (two) times daily. (Patient not taking: Reported on 07/28/2016) 60 capsule 3  . sodium chloride (OCEAN) 0.65 % SOLN nasal spray Place 4 sprays into both nostrils every hour as needed for congestion. 30 mL 0    Results for orders placed or performed during the hospital encounter of 12/25/16 (from the past 48 hour(s))  Basic metabolic panel     Status: Abnormal   Collection Time: 12/25/16  8:43 PM  Result Value Ref Range   Sodium 140 135 - 145 mmol/L   Potassium 3.6 3.5 - 5.1 mmol/L   Chloride 106 101 - 111 mmol/L   CO2 24 22 - 32 mmol/L   Glucose, Bld 122 (H) 65 - 99 mg/dL   BUN 10 6 - 20 mg/dL   Creatinine, Ser 0.87 0.44 - 1.00 mg/dL   Calcium 9.9 8.9 - 10.3 mg/dL   GFR calc non Af Amer >60 >60 mL/min   GFR calc Af Amer >60 >60 mL/min    Comment: (NOTE) The eGFR has been calculated using the CKD EPI equation. This calculation has not been validated in all clinical situations. eGFR's persistently <60 mL/min signify possible Chronic Kidney Disease.    Anion gap 10 5 - 15  CBC     Status:  Abnormal   Collection Time: 12/25/16  8:43 PM  Result Value Ref Range   WBC 9.5 4.0 - 10.5 K/uL   RBC 5.19 (H) 3.87 - 5.11 MIL/uL   Hemoglobin 13.9 12.0 - 15.0 g/dL   HCT 41.5 36.0 - 46.0 %   MCV 80.0 78.0 - 100.0 fL   MCH 26.8 26.0 - 34.0 pg   MCHC 33.5 30.0 - 36.0 g/dL   RDW 13.1 11.5 - 15.5 %   Platelets 571 (H) 150 - 400 K/uL  Magnesium     Status: None   Collection Time: 12/25/16  8:43 PM  Result Value Ref Range   Magnesium 2.0 1.7 - 2.4 mg/dL  I-stat troponin, ED     Status: None   Collection Time: 12/25/16  8:59 PM  Result Value Ref Range   Troponin i, poc 0.06 0.00 - 0.08 ng/mL   Comment 3            Comment: Due to the release kinetics of cTnI, a negative result within the first hours of the onset of symptoms does not rule out myocardial infarction with certainty. If myocardial infarction is still suspected, repeat the test at appropriate intervals.   I-Stat Chem 8, ED     Status: Abnormal   Collection Time: 12/25/16  9:11 PM  Result Value Ref Range   Sodium 142 135 - 145 mmol/L   Potassium 3.6 3.5 - 5.1 mmol/L   Chloride 106 101 - 111 mmol/L   BUN 12 6 - 20 mg/dL   Creatinine, Ser 0.90 0.44 - 1.00 mg/dL   Glucose, Bld 123 (H) 65 - 99 mg/dL   Calcium, Ion 1.24 1.15 - 1.40 mmol/L   TCO2 28 0 - 100 mmol/L   Hemoglobin 14.3 12.0 - 15.0 g/dL   HCT 42.0 36.0 - 46.0 %  I-Stat CG4 Lactic Acid, ED     Status: None   Collection Time: 12/25/16  9:12 PM  Result Value Ref Range   Lactic Acid, Venous 1.14 0.5 - 1.9 mmol/L   Dg Chest Port 1 View  Result Date: 12/25/2016 CLINICAL DATA:  RIGHT flank shingles. Generalized fatigue and lightheadedness. EXAM: PORTABLE CHEST 1 VIEW COMPARISON:  None. FINDINGS: The heart size and mediastinal contours are within normal limits. Both lungs are clear. The visualized skeletal structures are unremarkable. IMPRESSION: Normal chest radiograph. Electronically Signed   By: Elon Alas M.D.   On: 12/25/2016 22:19    ECG/TELE:  Regular narrow complex tachycardia; no obvious P waves or flutter waves.  No variability to conduction.  Differential: atrial flutter, AVRNT, junctional tachycardia.  Poor septal forces on NSR ECG  ROS: As above. Otherwise, review of  systems is negative unless per above HPI  Vitals:   12/25/16 2032 12/25/16 2130 12/25/16 2200 12/25/16 2247  BP: 103/77 120/65 115/82 124/86  Pulse: (!) 125 (!) 123 77 79  Resp: _0 Temp: 98 F (36.7 C)     TempSrc: Oral     SpO2: 99% 91% 99% 99%   Wt Readings from Last 10 Encounters:  10/16/16 48.1 kg (106 lb 0.7 oz)  10/08/16 48.5 kg (106 lb 14.4 oz)  12/25/13 55.8 kg (123 lb)  08/22/13 53.1 kg (117 lb)  08/10/13 59 kg (130 lb)  08/29/09 60.8 kg (134 lb)  05/22/09 56.2 kg (124 lb)  04/20/08 55.8 kg (123 lb)  03/19/08 52.4 kg (115 lb 8 oz)  02/21/08 50.8 kg (112 lb)    PE:  General: No acute distress HEENT: Atraumatic, EOMI, mucous membranes moist CV: RRR no murmurs, gallops. No JVD at 45 degrees. No HJR. Respiratory: Clear, no crackles. Normal work of breathing ABD: Non-distended and non-tender. No palpable organomegaly.  Extremities: 2+ radial pulses bilaterally. No edema. Neuro/Psych: CN grossly intact, alert and oriented  Assessment/Plan Narrow complex narrow SVT -- ECG/TELE: Regular narrow complex tachycardia; no obvious P waves or flutter waves.  No variability to conduction.  Differential: atrial flutter, AVRNT, junctional tachycardia.   Abnormal ECG in NSR- Poor septal forces  Active shingles infection  Recommendations: Based on review of tele, favor benign SVT as opposed to atrial arrhythmia like fibrillation or flutter.  Likely driven by underlying infection and caffeine use.  Converted with diltiazem 5 mg IV x 1.  Instructed her to reduce caffeine use.  Patient already on ASA 81 mg, I don't think there is evidence to call this atrial flutter despite rate close to 150, so would avoid anticoagulation at this time and just  continue ASA 81 mg qd. Would check TSH/free T4.  Should this recur in the absence of underlying stressors with symptoms, EP study/ablation may be indicated.  Can start low dose beta blocker if continues to recur in the absence of stressors.  Baseline ECG is abnormal (septal QW), recommend TTE to rule out structural abnormalities. Heart monitor at discharge can also be considered.   - TSH/free T4 - TTE - ASA 81 mg    Lolita Cram Kamarie Veno  MD 12/25/2016, 11:22 PM

## 2016-12-25 NOTE — H&P (Signed)
History and Physical    Janet Sherman [id number] DOB: 03/02/1951 DOA: 12/25/2016  Referring MD/NP/PA:   PCP: No PCP Per Patient   Patient coming from:  The patient is coming from home.  At baseline, pt is independent for most of ADL.   Chief Complaint: right flank rash and SVT  HPI: Janet Sherman is a 65 y.o. female with medical history significant of recent craniotomy for brain pituitary tumor, tobacco abuse, gallstone, who presents with right flank rash and SVT  Pt states that he noted vesicular rash to right flank area x 1 week. Initially the rash area is painful which has improved. She was seen in UC and treated with topical cream without improvement. She has generalized weakness. She denies fever or chills. In ED triage, pt was found to be tachycardic in SVT which broke with diltiazem 5 mg IV x 1, but later on she has recurrent SVT. Pt denies any chest pain, shortness of breath, palpitation. No nausea, vomiting, diarrhea, symptoms of UTI or unilateral weakness. She notes that she drinks considerable amount of coffee (4-5 cups/day) and soda up to 3 as well.  ED Course: pt was found to have trop 0.19, WBC 9.5, lactate 1.14, electrolytes renal function okay, magnesium 2.0, negative chest x-ray, tachycardia. Patient is admitted to stepdown as inpt. Cardiology was consulted.  Review of Systems:   General: no fevers, chills, no changes in body weight, has fatigue HEENT: no blurry vision, hearing changes or sore throat Respiratory: no dyspnea, coughing, wheezing CV: no chest pain, no palpitations GI: no nausea, vomiting, abdominal pain, diarrhea, constipation GU: no dysuria, burning on urination, increased urinary frequency, hematuria  Ext: no leg edema Neuro: no unilateral weakness, numbness, or tingling, no vision change or hearing loss Skin: has rashes. MSK: No muscle spasm, no deformity, no limitation of range of movement in spin Heme: No easy bruising.  Travel history: No recent  long distant travel.  Allergy:  Allergies  Allergen Reactions  . No Known Allergies     Past Medical History:  Diagnosis Date  . Gallstones     Past Surgical History:  Procedure Laterality Date  . CRANIOTOMY N/A 10/16/2016   Procedure: CRANIOTOMY HYPOPHYSECTOMY TRANSNASAL APPROACH;  Surgeon: Consuella Lose, MD;  Location: [facility];  Service: Neurosurgery;  Laterality: N/A;  . gallstones     15 years ago  . TRANSNASAL APPROACH N/A 10/16/2016   Procedure: TRANSNASAL APPROACH WITH FUSION;  Surgeon: Jerrell Belfast, MD;  Location: [facility];  Service: ENT;  Laterality: N/A;    Social History:  reports that she has been smoking Cigarettes.  She has been smoking about 0.25 packs per day. She has never used smokeless tobacco. She reports that she does not drink alcohol or use drugs.  Family History:  Family History  Problem Relation Age of Onset  . Hypertension Mother   . Cancer Father     lung  . Hypertension Sister   . Hypertension Brother      Prior to Admission medications   Medication Sig Start Date End Date Taking? Authorizing Provider  acetaminophen (TYLENOL) 500 MG tablet Take 1,000 mg by mouth every 6 (six) hours as needed for mild pain.    Historical Provider, MD  acetaminophen (TYLENOL) 650 MG CR tablet Take 650 mg by mouth every 8 (eight) hours as needed for pain.    Historical Provider, MD  acetaminophen-codeine (TYLENOL #3) 300-30 MG tablet Take 1 tablet by mouth every 6 (six) hours as needed for moderate pain.  10/18/16   Ashok Pall, MD  aspirin 81 MG chewable tablet Chew 1 tablet (81 mg total) by mouth daily. Patient not taking: Reported on 07/28/2016 08/10/13   Cleatrice Burke, PA-C  CALCIUM PO Take 1 tablet by mouth daily.    Historical Provider, MD  fluticasone (CUTIVATE) 0.05 % cream Apply topically 2 (two) times daily. 12/24/16   Billy Fischer, MD  HYDROcodone-acetaminophen (NORCO/VICODIN) 5-325 MG tablet Take 1-2 tablets by mouth every 4 (four) hours as needed for  moderate pain or severe pain. 12/25/16   Duffy Bruce, MD  Multiple Vitamins-Minerals (MULTIVITAMIN WITH MINERALS) tablet Take 1 tablet by mouth daily.    Historical Provider, MD  predniSONE (DELTASONE) 20 MG tablet Take 1 tablet (20 mg total) by mouth daily. 12/25/16 12/30/16  Duffy Bruce, MD  pregabalin (LYRICA) 75 MG capsule Take 1 capsule (75 mg total) by mouth 2 (two) times daily. Patient not taking: Reported on 07/28/2016 09/06/13   Drema Dallas, DO  sodium chloride (OCEAN) 0.65 % SOLN nasal spray Place 4 sprays into both nostrils every hour as needed for congestion. 10/18/16   Ashok Pall, MD  valACYclovir (VALTREX) 1000 MG tablet Take 1 tablet (1,000 mg total) by mouth 3 (three) times daily. 12/25/16 01/01/17  Duffy Bruce, MD    Physical Exam: Vitals:   12/26/16 0000 12/26/16 0015 12/26/16 0103 12/26/16 0400  BP: 104/71 101/70 120/79 92/67  Pulse: 72 68 66 (!) 57  Resp: 25 18 (!) 23 13  Temp:    98.1 F (36.7 C)  TempSrc:    Oral  SpO2: 94% 96% 99% 96%  Weight:   58.4 kg (128 lb 12 oz)   Height:   5\' 5"  (1.651 m)    General: Not in acute distress HEENT:       Eyes: PERRL, EOMI, no scleral icterus.       ENT: No discharge from the ears and nose, no pharynx injection, no tonsillar enlargement.        Neck: No JVD, no bruit, no mass felt. Heme: No neck lymph node enlargement. Cardiac: S1/S2, RRR, No murmurs, No gallops or rubs. Respiratory: No rales, wheezing, rhonchi or rubs. GI: Soft, nondistended, nontender, no rebound pain, no organomegaly, BS present. GU: No hematuria Ext: No pitting leg edema bilaterally. 2+DP/PT pulse bilaterally. Musculoskeletal: No joint deformities, No joint redness or warmth, no limitation of ROM in spin. Skin: has vesicular rash to right flank area Neuro: Alert, oriented X3, cranial nerves II-XII grossly intact, moves all extremities normally.  Psych: Patient is not psychotic, no suicidal or hemocidal ideation.  Labs on Admission: I have  personally reviewed following labs and imaging studies  CBC:  Recent Labs Lab 12/25/16 2043 12/25/16 2111  WBC 9.5  --   HGB 13.9 14.3  HCT 41.5 42.0  MCV 80.0  --   PLT 571*  --    Basic Metabolic Panel:  Recent Labs Lab 12/25/16 2043 12/25/16 2111  NA 140 142  K 3.6 3.6  CL 106 106  CO2 24  --   GLUCOSE 122* 123*  BUN 10 12  CREATININE 0.87 0.90  CALCIUM 9.9  --   MG 2.0  --    GFR: Estimated Creatinine Clearance: 56.8 mL/min (by C-G formula based on SCr of 0.9 mg/dL). Liver Function Tests: No results for input(s): AST, ALT, ALKPHOS, BILITOT, PROT, ALBUMIN in the last 168 hours. No results for input(s): LIPASE, AMYLASE in the last 168 hours. No results for input(s): AMMONIA in  the last 168 hours. Coagulation Profile: No results for input(s): INR, PROTIME in the last 168 hours. Cardiac Enzymes:  Recent Labs Lab 12/26/16 0246  TROPONINI 0.19*   BNP (last 3 results) No results for input(s): PROBNP in the last 8760 hours. HbA1C: No results for input(s): HGBA1C in the last 72 hours. CBG: No results for input(s): GLUCAP in the last 168 hours. Lipid Profile:  Recent Labs  12/26/16 0246  CHOL 187  HDL 51  LDLCALC 126*  TRIG 52  CHOLHDL 3.7   Thyroid Function Tests:  Recent Labs  12/25/16 2300  TSH 3.148  FREET4 0.75   Anemia Panel: No results for input(s): VITAMINB12, FOLATE, FERRITIN, TIBC, IRON, RETICCTPCT in the last 72 hours. Urine analysis:    Component Value Date/Time   COLORURINE YELLOW 07/28/2016 1404   APPEARANCEUR CLEAR 07/28/2016 1404   LABSPEC 1.007 07/28/2016 1404   PHURINE 7.5 07/28/2016 1404   GLUCOSEU NEGATIVE 07/28/2016 1404   GLUCOSEU negative 02/11/2008   HGBUR NEGATIVE 07/28/2016 1404   HGBUR trace-lysed 08/29/2009 1513   BILIRUBINUR NEGATIVE 07/28/2016 1404   KETONESUR NEGATIVE 07/28/2016 1404   PROTEINUR NEGATIVE 07/28/2016 1404   UROBILINOGEN 1.0 08/29/2009 1513   NITRITE NEGATIVE 07/28/2016 1404   LEUKOCYTESUR  TRACE (A) 07/28/2016 1404   Sepsis Labs: @LABRCNTIP (procalcitonin:4,lacticidven:4) ) Recent Results (from the past 240 hour(s))  MRSA PCR Screening     Status: None   Collection Time: 12/26/16  1:07 AM  Result Value Ref Range Status   MRSA by PCR NEGATIVE NEGATIVE Final    Comment:        The GeneXpert MRSA Assay (FDA approved for NASAL specimens only), is one component of a comprehensive MRSA colonization surveillance program. It is not intended to diagnose MRSA infection nor to guide or monitor treatment for MRSA infections.      Radiological Exams on Admission: Dg Chest Port 1 View  Result Date: 12/25/2016 CLINICAL DATA:  RIGHT flank shingles. Generalized fatigue and lightheadedness. EXAM: PORTABLE CHEST 1 VIEW COMPARISON:  None. FINDINGS: The heart size and mediastinal contours are within normal limits. Both lungs are clear. The visualized skeletal structures are unremarkable. IMPRESSION: Normal chest radiograph. Electronically Signed   By: Elon Alas M.D.   On: 12/25/2016 22:19     EKG: Independently reviewed. SVT, QTC 461, anteroseptal infarction pattern.  Assessment/Plan Principal Problem:   SVT (supraventricular tachycardia) (HCC) Active Problems:   TOBACCO ABUSE   Shingles   Elevated troponin   SVT and elevated trop: SVT is likely triggered by shingle. Patient does not have chest pain, shortness breath or palpitation. The elevated trop is likely due to demand ischemia. Cardiology was consulted, Dr. Lamona Curl 2-D echo.  - will place on SDU bed for obs - highly appreciate cardiology's consultation - cycle CE q6 x3 and repeat her EKG in the am  - ASA - Risk factor stratification: will check FLP and A1C , TSH, Free T4 - 2d echo - prn cardizem  Shingles:  -prn Norco for pain -started valtrex -received one dose of prednisone in ED. -airbone precaution, isolation room  Tobacco abuse: -Did counseling about importance of quitting smoking -Nicotine  patch  DVT ppx: SQ Lovenox Code Status: Full code Family Communication: Yes, patient's husband at bed side Disposition Plan:  Anticipate discharge back to previous home environment Consults called:  Card, Dr. Lamona Curl Admission status: SDU/obs  Date of Service 12/26/2016    Ivor Costa [facility] Pager [phone]  If 7PM-7AM, please contact night-coverage [email or web address] Password [email or web address]  12/26/2016, 6:45 AM

## 2016-12-26 ENCOUNTER — Encounter (HOSPITAL_COMMUNITY): Payer: Self-pay

## 2016-12-26 ENCOUNTER — Observation Stay (HOSPITAL_BASED_OUTPATIENT_CLINIC_OR_DEPARTMENT_OTHER): Payer: BLUE CROSS/BLUE SHIELD

## 2016-12-26 DIAGNOSIS — R778 Other specified abnormalities of plasma proteins: Secondary | ICD-10-CM | POA: Diagnosis present

## 2016-12-26 DIAGNOSIS — B029 Zoster without complications: Secondary | ICD-10-CM

## 2016-12-26 DIAGNOSIS — R7989 Other specified abnormal findings of blood chemistry: Secondary | ICD-10-CM

## 2016-12-26 DIAGNOSIS — I471 Supraventricular tachycardia: Secondary | ICD-10-CM | POA: Diagnosis not present

## 2016-12-26 DIAGNOSIS — F17200 Nicotine dependence, unspecified, uncomplicated: Secondary | ICD-10-CM | POA: Diagnosis not present

## 2016-12-26 DIAGNOSIS — R748 Abnormal levels of other serum enzymes: Secondary | ICD-10-CM | POA: Diagnosis not present

## 2016-12-26 LAB — CBC
HCT: 36 % (ref 36.0–46.0)
HEMOGLOBIN: 12 g/dL (ref 12.0–15.0)
MCH: 26.8 pg (ref 26.0–34.0)
MCHC: 33.3 g/dL (ref 30.0–36.0)
MCV: 80.4 fL (ref 78.0–100.0)
PLATELETS: 437 10*3/uL — AB (ref 150–400)
RBC: 4.48 MIL/uL (ref 3.87–5.11)
RDW: 13.2 % (ref 11.5–15.5)
WBC: 5.8 10*3/uL (ref 4.0–10.5)

## 2016-12-26 LAB — TSH: TSH: 3.148 u[IU]/mL (ref 0.350–4.500)

## 2016-12-26 LAB — TROPONIN I
TROPONIN I: 0.19 ng/mL — AB (ref <0.03)
TROPONIN I: 0.18 ng/mL — AB (ref <0.03)
TROPONIN I: 0.13 ng/mL — AB (ref <0.03)

## 2016-12-26 LAB — BASIC METABOLIC PANEL
ANION GAP: 6 (ref 5–15)
BUN: 8 mg/dL (ref 6–20)
CALCIUM: 9.2 mg/dL (ref 8.9–10.3)
CO2: 26 mmol/L (ref 22–32)
CREATININE: 0.73 mg/dL (ref 0.44–1.00)
Chloride: 110 mmol/L (ref 101–111)
GFR calc Af Amer: >60 mL/min (ref >60)
GLUCOSE: 121 mg/dL — AB (ref 65–99)
Potassium: 4.7 mmol/L (ref 3.5–5.1)
Sodium: 142 mmol/L (ref 135–145)

## 2016-12-26 LAB — ECHOCARDIOGRAM COMPLETE
Height: 65 in
Weight: 2059.98 oz

## 2016-12-26 LAB — LIPID PANEL
Cholesterol: 187 mg/dL (ref 0–200)
HDL: 51 mg/dL (ref >40)
LDL CALC: 126 mg/dL — AB (ref 0–99)
TRIGLYCERIDES: 52 mg/dL (ref <150)
Total CHOL/HDL Ratio: 3.7 RATIO
VLDL: 10 mg/dL (ref 0–40)

## 2016-12-26 LAB — MRSA PCR SCREENING: MRSA by PCR: NEGATIVE

## 2016-12-26 LAB — GLUCOSE, CAPILLARY: GLUCOSE-CAPILLARY: 97 mg/dL (ref 65–99)

## 2016-12-26 LAB — T4, FREE: Free T4: 0.75 ng/dL (ref 0.61–1.12)

## 2016-12-26 MED ORDER — HYDROCODONE-ACETAMINOPHEN 5-325 MG PO TABS
1.0000 | ORAL_TABLET | Freq: Four times a day (QID) | ORAL | Status: DC | PRN
  Administered 2016-12-26: 1 via ORAL
  Filled 2016-12-26: qty 1

## 2016-12-26 MED ORDER — ACETAMINOPHEN 325 MG PO TABS
650.0000 mg | ORAL_TABLET | Freq: Four times a day (QID) | ORAL | Status: DC | PRN
Start: 2016-12-26 — End: 2016-12-26

## 2016-12-26 MED ORDER — FLUOCINONIDE 0.05 % EX CREA
TOPICAL_CREAM | Freq: Two times a day (BID) | CUTANEOUS | Status: DC
  Administered 2016-12-26: 1 via TOPICAL
  Filled 2016-12-26: qty 30

## 2016-12-26 MED ORDER — ATORVASTATIN CALCIUM 20 MG PO TABS: 20.0000 mg | ORAL_TABLET | Freq: Every day | ORAL | 0 refills | Status: DC

## 2016-12-26 MED ORDER — VALACYCLOVIR HCL 500 MG PO TABS
500.0000 mg | ORAL_TABLET | Freq: Three times a day (TID) | ORAL | Status: DC
  Administered 2016-12-26: 500 mg via ORAL
  Filled 2016-12-26 (×2): qty 1

## 2016-12-26 MED ORDER — GABAPENTIN 100 MG PO CAPS: 100.0000 mg | ORAL_CAPSULE | Freq: Three times a day (TID) | ORAL | 0 refills | Status: DC

## 2016-12-26 MED ORDER — VALACYCLOVIR HCL 1 G PO TABS
1000.0000 mg | ORAL_TABLET | Freq: Three times a day (TID) | ORAL | 0 refills | Status: AC
Start: 2016-12-26 — End: 2017-01-02

## 2016-12-26 MED ORDER — ACETAMINOPHEN 650 MG RE SUPP: 650.0000 mg | Freq: Four times a day (QID) | RECTAL | Status: DC | PRN

## 2016-12-26 MED ORDER — ZOLPIDEM TARTRATE 5 MG PO TABS: 5.0000 mg | ORAL_TABLET | Freq: Every evening | ORAL | Status: DC | PRN

## 2016-12-26 MED ORDER — NICOTINE 21 MG/24HR TD PT24
21.0000 mg | MEDICATED_PATCH | Freq: Every day | TRANSDERMAL | Status: DC
  Administered 2016-12-26: 21 mg via TRANSDERMAL
  Filled 2016-12-26: qty 1

## 2016-12-26 MED ORDER — ONDANSETRON HCL 4 MG PO TABS: 4.0000 mg | ORAL_TABLET | Freq: Four times a day (QID) | ORAL | Status: DC | PRN

## 2016-12-26 MED ORDER — DILTIAZEM HCL 25 MG/5ML IV SOLN
5.0000 mg | INTRAVENOUS | Status: DC | PRN
  Filled 2016-12-26: qty 5

## 2016-12-26 MED ORDER — GABAPENTIN 100 MG PO CAPS
100.0000 mg | ORAL_CAPSULE | Freq: Three times a day (TID) | ORAL | Status: DC
  Administered 2016-12-26 (×2): 100 mg via ORAL
  Filled 2016-12-26 (×2): qty 1

## 2016-12-26 MED ORDER — SODIUM CHLORIDE 0.9 % IV SOLN
INTRAVENOUS | Status: DC
  Administered 2016-12-26: 02:00:00 via INTRAVENOUS

## 2016-12-26 MED ORDER — ASPIRIN 81 MG PO CHEW
81.0000 mg | CHEWABLE_TABLET | Freq: Every day | ORAL | Status: DC
  Administered 2016-12-26: 81 mg via ORAL
  Filled 2016-12-26: qty 1

## 2016-12-26 MED ORDER — ONDANSETRON HCL 4 MG/2ML IJ SOLN: 4.0000 mg | Freq: Four times a day (QID) | INTRAMUSCULAR | Status: DC | PRN

## 2016-12-26 MED ORDER — VALACYCLOVIR HCL 500 MG PO TABS
1000.0000 mg | ORAL_TABLET | Freq: Three times a day (TID) | ORAL | Status: DC
  Administered 2016-12-26: 1000 mg via ORAL
  Filled 2016-12-26 (×2): qty 2

## 2016-12-26 MED ORDER — ENOXAPARIN SODIUM 30 MG/0.3ML ~~LOC~~ SOLN
30.0000 mg | SUBCUTANEOUS | Status: DC
  Administered 2016-12-26: 30 mg via SUBCUTANEOUS
  Filled 2016-12-26: qty 0.3

## 2016-12-26 MED ORDER — SODIUM CHLORIDE 0.9% FLUSH
3.0000 mL | Freq: Two times a day (BID) | INTRAVENOUS | Status: DC
  Administered 2016-12-26: 3 mL via INTRAVENOUS

## 2016-12-26 NOTE — Discharge Instructions (Signed)
-   Try to decrease the amount of coffee you drink  - Take the medications as prescribed  - Return to the ED if you develop fever, worsening rash, uncontrollable pain, or other concerning symptoms  Please take all your medications with you for your next visit with your Primary MD. Please request your Primary MD to go over all hospital test results at the follow up. Please ask your Primary MD to get all Hospital records sent to his/her office.  If you experience worsening of your admission symptoms, develop shortness of breath, chest pain, suicidal or homicidal thoughts or a life threatening emergency, you must seek medical attention immediately by calling 911 or calling your MD.  Dennis Bast must read the complete instructions/literature along with all the possible adverse reactions/side effects for all the medicines you take including new medications that have been prescribed to you. Take new medicines after you have completely understood and accpet all the possible adverse reactions/side effects.   Do not drive when taking pain medications or sedatives.    Do not take more than prescribed Pain, Sleep and Anxiety Medications  If you have smoked or chewed Tobacco in the last 2 yrs please stop. Stop any regular alcohol and or recreational drug use.  Wear Seat belts while driving.

## 2016-12-26 NOTE — Progress Notes (Signed)
Dr. Harrington Challenger called me to request that patient be seen in office for appointment to then consider arranging Lexiscan nuclear stress test. I have sent a message to our [facility] office's scheduler requesting a follow-up appointment, and our office will call the patient with this information.   Annessa Satre PA-C

## 2016-12-26 NOTE — Progress Notes (Signed)
Patient given discharge instructions and verbalized understanding.

## 2016-12-26 NOTE — Progress Notes (Signed)
  Echocardiogram 2D Echocardiogram has been performed.  Janet Sherman 12/26/2016, 12:01 PM

## 2016-12-26 NOTE — Discharge Summary (Addendum)
Physician Discharge Summary  Janet Sherman [id number] DOB: [birth year] DOA: 12/25/2016  PCP: No PCP Per Patient  Admit date: 12/25/2016 Discharge date: 12/26/2016  Admitted From: home Disposition:  home   Recommendations for Outpatient Follow-up:  1. F/u with cardiology  Home Health:  none  Equipment/Devices:  none    Discharge Condition:  Stable    CODE STATUS:  Full codse   Diet recommendation:  Heart healthy, low sodium Consultations:  Cardiology.     Discharge Diagnoses:  Principal Problem:   SVT (supraventricular tachycardia) (HCC) Active Problems:   TOBACCO ABUSE   Shingles   Elevated troponin    Subjective: Burning and stinging pain in right flank. No other complaints.   Brief Summary: Janet Sherman is a 65 y.o. female with medical history significant of recent craniotomy for brain pituitary tumor, tobacco abuse, gallstone, who presents with right flank rash and SVT  Pt states that he noted vesicular rash to right flank area x 1 week.  She was seen in UC and treated with topical cream without improvement. In ED triage, pt was found to be tachycardic in SVT which broke with diltiazem 5 mg IV x 1, but later on she has recurrent SVT. Pt denies any chest pain, shortness of breath, palpitation. No nausea, vomiting, diarrhea, symptoms of UTI or unilateral weakness. She notes that she drinks considerable amount of coffee (4-5 cups/day) and soda up to 3 as well.   Hospital Course:  SVT and elevated trop:   Patient did not have chest pain, shortness breath or palpitation. The elevated trop is likely due to demand ischemia -it has been 0.19 and 0.18.  - SVT has resolved with 1 dose of IV Diltiazem and not recurred- cardiology does not recommend starting any medication for this at this time - thyroid functions normal - Cardiology was consulted & 2-D echo ordered. - 2d echo show focal hypokinesis of LV with EF of 50%- cardiology recommending ASA 81 mg and statin- Dr  Harrington Challenger will ensure she has an outpt follow up- I have explained findings to patient  - LDL 126- Lipitor started   Shingles:  - right flank vesicles and pain -started valtrex + Neurotin  Tobacco abuse: -Did counseling about importance of quitting smoking -Nicotine patch  Discharge Instructions  Discharge Instructions    Diet - low sodium heart healthy    Complete by:  As directed    Increase activity slowly    Complete by:  As directed      Allergies as of 12/26/2016      Reactions   No Known Allergies       Medication List    STOP taking these medications   fluticasone 0.05 % cream Commonly known as:  CUTIVATE   pregabalin 75 MG capsule- she states she was not taking this Commonly known as:  LYRICA   sodium chloride 0.65 % Soln nasal spray Commonly known as:  OCEAN     TAKE these medications   acetaminophen-codeine 300-30 MG tablet Commonly known as:  TYLENOL #3 Take 1 tablet by mouth every 6 (six) hours as needed for moderate pain.   aspirin 81 MG chewable tablet Chew 1 tablet (81 mg total) by mouth daily.   atorvastatin 20 MG tablet Commonly known as:  LIPITOR Take 1 tablet (20 mg total) by mouth daily.   gabapentin 100 MG capsule Commonly known as:  NEURONTIN Take 1 capsule (100 mg total) by mouth 3 (three) times daily.   HYDROcodone-acetaminophen 5-325 MG tablet  Commonly known as:  NORCO/VICODIN Take 1-2 tablets by mouth every 4 (four) hours as needed for moderate pain or severe pain.   valACYclovir 1000 MG tablet Commonly known as:  VALTREX Take 1 tablet (1,000 mg total) by mouth 3 (three) times daily.      Follow-up Information    [facility] Follow up.   Why:  You were seen by Dr Harrington Challenger. Call to set up an appointment with a Cardiologist as an outpatient for your heart rhythm Contact information: [place] [phone]       Schedule an  appointment as soon as possible for a visit with [facility].   Why:  Follow-up with a primary doctor in 3-5 days as needed. If you do not have a doctor, you can call this number to set up an appointment with one. Contact information: [place] [phone]         Allergies  Allergen Reactions  . No Known Allergies      Procedures/Studies: Left ventricle: LVEF is mildly depressed with hypokinesis of the   mid/distal anteriorand distal anterolateral walls The cavity size   was normal. Wall thickness was normal. Systolic function was   mildly reduced. The estimated ejection fraction is approxiamately   50%. Doppler parameters are consistent with abnormal left   ventricular relaxation (grade 1 diastolic dysfunction). - Mitral valve: There was mild regurgitation. - Right atrium: The atrium was mildly dilated. - Pulmonary arteries: PA peak pressure: 36 mm Hg (S).  Dg Chest Port 1 View  Result Date: 12/25/2016 CLINICAL DATA:  RIGHT flank shingles. Generalized fatigue and lightheadedness. EXAM: PORTABLE CHEST 1 VIEW COMPARISON:  None. FINDINGS: The heart size and mediastinal contours are within normal limits. Both lungs are clear. The visualized skeletal structures are unremarkable. IMPRESSION: Normal chest radiograph. Electronically Signed   By: Elon Alas M.D.   On: 12/25/2016 22:19       Discharge Exam: Vitals:   12/26/16 1300 12/26/16 1400  BP: 128/79 123/70  Pulse: (!) 53 (!) 52  Resp: 17 (!) 24  Temp:     Vitals:   12/26/16 1100 12/26/16 1200 12/26/16 1300 12/26/16 1400  BP: 138/72 138/72 128/79 123/70  Pulse: 80 (!) 59 (!) 53 (!) 52  Resp: 19 18 17  (!) 24  Temp: 97.8 F (36.6 C)     TempSrc: Oral     SpO2: 95% 100% 99% 100%  Weight:      Height:        General: Pt is alert, awake, not in acute distress Cardiovascular: RRR, S1/S2 +, no rubs, no gallops Respiratory: CTA  bilaterally, no wheezing, no rhonchi Abdominal: Soft, NT, ND, bowel sounds + Extremities: no edema, no cyanosis    The results of significant diagnostics from this hospitalization (including imaging, microbiology, ancillary and laboratory) are listed below for reference.     Microbiology: Recent Results (from the past 240 hour(s))  MRSA PCR Screening     Status: None   Collection Time: 12/26/16  1:07 AM  Result Value Ref Range Status   MRSA by PCR NEGATIVE NEGATIVE Final    Comment:        The GeneXpert MRSA Assay (FDA approved for NASAL specimens only), is one component of a comprehensive MRSA colonization surveillance program. It is not intended to diagnose MRSA infection nor to guide or monitor treatment for MRSA infections.  Labs: BNP (last 3 results) No results for input(s): BNP in the last 8760 hours. Basic Metabolic Panel:  Recent Labs Lab 12/25/16 2043 12/25/16 2111 12/26/16 0757  NA 140 142 142  K 3.6 3.6 4.7  CL 106 106 110  CO2 24  --  26  GLUCOSE 122* 123* 121*  BUN 10 12 8   CREATININE 0.87 0.90 0.73  CALCIUM 9.9  --  9.2  MG 2.0  --   --    Liver Function Tests: No results for input(s): AST, ALT, ALKPHOS, BILITOT, PROT, ALBUMIN in the last 168 hours. No results for input(s): LIPASE, AMYLASE in the last 168 hours. No results for input(s): AMMONIA in the last 168 hours. CBC:  Recent Labs Lab 12/25/16 2043 12/25/16 2111 12/26/16 0757  WBC 9.5  --  5.8  HGB 13.9 14.3 12.0  HCT 41.5 42.0 36.0  MCV 80.0  --  80.4  PLT 571*  --  437*   Cardiac Enzymes:  Recent Labs Lab 12/26/16 0246 12/26/16 0757  TROPONINI 0.19* 0.18*   BNP: Invalid input(s): POCBNP CBG:  Recent Labs Lab 12/26/16 0817  GLUCAP 97   D-Dimer No results for input(s): DDIMER in the last 72 hours. Hgb A1c No results for input(s): HGBA1C in the last 72 hours. Lipid Profile  Recent Labs  12/26/16 0246  CHOL 187  HDL 51  LDLCALC 126*  TRIG 52  CHOLHDL  3.7   Thyroid function studies  Recent Labs  12/25/16 2300  TSH 3.148   Anemia work up No results for input(s): VITAMINB12, FOLATE, FERRITIN, TIBC, IRON, RETICCTPCT in the last 72 hours. Urinalysis    Component Value Date/Time   COLORURINE YELLOW 07/28/2016 1404   APPEARANCEUR CLEAR 07/28/2016 1404   LABSPEC 1.007 07/28/2016 1404   PHURINE 7.5 07/28/2016 1404   GLUCOSEU NEGATIVE 07/28/2016 1404   GLUCOSEU negative 02/11/2008   HGBUR NEGATIVE 07/28/2016 1404   HGBUR trace-lysed 08/29/2009 1513   BILIRUBINUR NEGATIVE 07/28/2016 1404   KETONESUR NEGATIVE 07/28/2016 1404   PROTEINUR NEGATIVE 07/28/2016 1404   UROBILINOGEN 1.0 08/29/2009 1513   NITRITE NEGATIVE 07/28/2016 1404   LEUKOCYTESUR TRACE (A) 07/28/2016 1404   Sepsis Labs Invalid input(s): PROCALCITONIN,  WBC,  LACTICIDVEN Microbiology Recent Results (from the past 240 hour(s))  MRSA PCR Screening     Status: None   Collection Time: 12/26/16  1:07 AM  Result Value Ref Range Status   MRSA by PCR NEGATIVE NEGATIVE Final    Comment:        The GeneXpert MRSA Assay (FDA approved for NASAL specimens only), is one component of a comprehensive MRSA colonization surveillance program. It is not intended to diagnose MRSA infection nor to guide or monitor treatment for MRSA infections.      Time coordinating discharge: Over 30 minutes  SIGNED:   Debbe Odea, MD  [facility] 12/26/2016, 3:39 PM Pager   If 7PM-7AM, please contact night-coverage [email or web address] Password [email or web address]

## 2016-12-26 NOTE — Progress Notes (Signed)
CRITICAL VALUE ALERT  Critical value received:  Troponin 0.19  Date of notification:  12/26/2016  Time of notification:  [id number]  Critical value read back:Yes.    Nurse who received alert:  Brien Mates RN  MD notified (1st page):  Hugelmeyer  Time of first page:  [phone]  MD notified (2nd page):  Time of second page:  Responding MD:  Hugelmeyer  Time MD responded:  [phone]

## 2016-12-26 NOTE — Progress Notes (Signed)
Elevated trop likely demand ischemia - already on aspirin.  Trend trops, cardiology consulted.  Continue tele.

## 2016-12-26 NOTE — Progress Notes (Signed)
Pt arrived to floor (room [phone]) from ED accompanied by RN.  Vital signs stable, no complaints of pain at this time. Family present in waiting room.

## 2016-12-26 NOTE — Progress Notes (Signed)
Subjective: No SOB  No palpitations  Objective: Vitals:   12/26/16 0800 12/26/16 0820 12/26/16 0900 12/26/16 1000  BP: 115/85  (!) 124/54 130/76  Pulse: 63  64 62  Resp: (!) 27  (!) 25 (!) 28  Temp: 97.9 F (36.6 C) 97.9 F (36.6 C)    TempSrc: Oral Oral    SpO2: 100%  99% 100%  Weight:      Height:       Weight change:   Intake/Output Summary (Last 24 hours) at 12/26/16 1107 Last data filed at 12/26/16 0800  Gross per 24 hour  Intake          1674.67 ml  Output                0 ml  Net          1674.67 ml    General: Alert, awake, oriented x3, in no acute distress Neck:  JVP is normal Heart: Regular rate and rhythm, without murmurs, rubs, gallops.  Lungs: Clear to auscultation.  No rales or wheezes. Exemities:  No edema.   Neuro: Grossly intact, nonfocal.  TEle;  SB SR   Lab Results: Results for orders placed or performed during the hospital encounter of 12/25/16 (from the past 24 hour(s))  Basic metabolic panel     Status: Abnormal   Collection Time: 12/25/16  8:43 PM  Result Value Ref Range   Sodium 140 135 - 145 mmol/L   Potassium 3.6 3.5 - 5.1 mmol/L   Chloride 106 101 - 111 mmol/L   CO2 24 22 - 32 mmol/L   Glucose, Bld 122 (H) 65 - 99 mg/dL   BUN 10 6 - 20 mg/dL   Creatinine, Ser 0.87 0.44 - 1.00 mg/dL   Calcium 9.9 8.9 - 10.3 mg/dL   GFR calc non Af Amer >60 >60 mL/min   GFR calc Af Amer >60 >60 mL/min   Anion gap 10 5 - 15  CBC     Status: Abnormal   Collection Time: 12/25/16  8:43 PM  Result Value Ref Range   WBC 9.5 4.0 - 10.5 K/uL   RBC 5.19 (H) 3.87 - 5.11 MIL/uL   Hemoglobin 13.9 12.0 - 15.0 g/dL   HCT 41.5 36.0 - 46.0 %   MCV 80.0 78.0 - 100.0 fL   MCH 26.8 26.0 - 34.0 pg   MCHC 33.5 30.0 - 36.0 g/dL   RDW 13.1 11.5 - 15.5 %   Platelets 571 (H) 150 - 400 K/uL  Magnesium     Status: None   Collection Time: 12/25/16  8:43 PM  Result Value Ref Range   Magnesium 2.0 1.7 - 2.4 mg/dL  I-stat troponin, ED     Status: None   Collection  Time: 12/25/16  8:59 PM  Result Value Ref Range   Troponin i, poc 0.06 0.00 - 0.08 ng/mL   Comment 3          I-Stat Chem 8, ED     Status: Abnormal   Collection Time: 12/25/16  9:11 PM  Result Value Ref Range   Sodium 142 135 - 145 mmol/L   Potassium 3.6 3.5 - 5.1 mmol/L   Chloride 106 101 - 111 mmol/L   BUN 12 6 - 20 mg/dL   Creatinine, Ser 0.90 0.44 - 1.00 mg/dL   Glucose, Bld 123 (H) 65 - 99 mg/dL   Calcium, Ion 1.24 1.15 - 1.40 mmol/L   TCO2 28 0 - 100 mmol/L  Hemoglobin 14.3 12.0 - 15.0 g/dL   HCT 42.0 36.0 - 46.0 %  I-Stat CG4 Lactic Acid, ED     Status: None   Collection Time: 12/25/16  9:12 PM  Result Value Ref Range   Lactic Acid, Venous 1.14 0.5 - 1.9 mmol/L  TSH     Status: None   Collection Time: 12/25/16 11:00 PM  Result Value Ref Range   TSH 3.148 0.350 - 4.500 uIU/mL  T4, free     Status: None   Collection Time: 12/25/16 11:00 PM  Result Value Ref Range   Free T4 0.75 0.61 - 1.12 ng/dL  MRSA PCR Screening     Status: None   Collection Time: 12/26/16  1:07 AM  Result Value Ref Range   MRSA by PCR NEGATIVE NEGATIVE  Lipid panel     Status: Abnormal   Collection Time: 12/26/16  2:46 AM  Result Value Ref Range   Cholesterol 187 0 - 200 mg/dL   Triglycerides 52 <150 mg/dL   HDL 51 >40 mg/dL   Total CHOL/HDL Ratio 3.7 RATIO   VLDL 10 0 - 40 mg/dL   LDL Cholesterol 126 (H) 0 - 99 mg/dL  Troponin I (q 6hr x 3)     Status: Abnormal   Collection Time: 12/26/16  2:46 AM  Result Value Ref Range   Troponin I 0.19 (HH) <0.03 ng/mL  Troponin I (q 6hr x 3)     Status: Abnormal   Collection Time: 12/26/16  7:57 AM  Result Value Ref Range   Troponin I 0.18 (HH) <0.03 ng/mL  Basic metabolic panel     Status: Abnormal   Collection Time: 12/26/16  7:57 AM  Result Value Ref Range   Sodium 142 135 - 145 mmol/L   Potassium 4.7 3.5 - 5.1 mmol/L   Chloride 110 101 - 111 mmol/L   CO2 26 22 - 32 mmol/L   Glucose, Bld 121 (H) 65 - 99 mg/dL   BUN 8 6 - 20 mg/dL    Creatinine, Ser 0.73 0.44 - 1.00 mg/dL   Calcium 9.2 8.9 - 10.3 mg/dL   GFR calc non Af Amer >60 >60 mL/min   GFR calc Af Amer >60 >60 mL/min   Anion gap 6 5 - 15  CBC     Status: Abnormal   Collection Time: 12/26/16  7:57 AM  Result Value Ref Range   WBC 5.8 4.0 - 10.5 K/uL   RBC 4.48 3.87 - 5.11 MIL/uL   Hemoglobin 12.0 12.0 - 15.0 g/dL   HCT 36.0 36.0 - 46.0 %   MCV 80.4 78.0 - 100.0 fL   MCH 26.8 26.0 - 34.0 pg   MCHC 33.3 30.0 - 36.0 g/dL   RDW 13.2 11.5 - 15.5 %   Platelets 437 (H) 150 - 400 K/uL  Glucose, capillary     Status: None   Collection Time: 12/26/16  8:17 AM  Result Value Ref Range   Glucose-Capillary 97 65 - 99 mg/dL    Studies/Results: Dg Chest Port 1 View  Result Date: 12/25/2016 CLINICAL DATA:  RIGHT flank shingles. Generalized fatigue and lightheadedness. EXAM: PORTABLE CHEST 1 VIEW COMPARISON:  None. FINDINGS: The heart size and mediastinal contours are within normal limits. Both lungs are clear. The visualized skeletal structures are unremarkable. IMPRESSION: Normal chest radiograph. Electronically Signed   By: Elon Alas M.D.   On: 12/25/2016 22:19    Medications:REviewed     @PROBHOSP @  1  SVT  Pt with  no recurrence since last night  She denies feeling her heart race Note sl increase in trop  Agree with following  Echo pending    LOS: 0 days   Dorris Carnes 12/26/2016, 11:07 AM

## 2016-12-27 LAB — HEMOGLOBIN A1C
HEMOGLOBIN A1C: 5.3 % (ref 4.8–5.6)
Mean Plasma Glucose: 105 mg/dL

## 2017-01-04 ENCOUNTER — Encounter: Payer: Self-pay | Admitting: *Deleted

## 2017-01-04 ENCOUNTER — Encounter: Payer: Self-pay | Admitting: Cardiology

## 2017-01-04 ENCOUNTER — Ambulatory Visit (INDEPENDENT_AMBULATORY_CARE_PROVIDER_SITE_OTHER): Payer: BLUE CROSS/BLUE SHIELD | Admitting: Cardiology

## 2017-01-04 ENCOUNTER — Encounter (INDEPENDENT_AMBULATORY_CARE_PROVIDER_SITE_OTHER): Payer: Self-pay

## 2017-01-04 ENCOUNTER — Other Ambulatory Visit: Payer: Self-pay | Admitting: Cardiology

## 2017-01-04 VITALS — BP 140/86 | HR 64 | Ht 65.0 in | Wt 115.0 lb

## 2017-01-04 DIAGNOSIS — I471 Supraventricular tachycardia: Secondary | ICD-10-CM | POA: Diagnosis not present

## 2017-01-04 DIAGNOSIS — Z01812 Encounter for preprocedural laboratory examination: Secondary | ICD-10-CM

## 2017-01-04 NOTE — Progress Notes (Signed)
Electrophysiology Office Note   Date:  01/04/2017   ID:  Janet Sherman, DOB 31-Dec-1950, MRN [id number]  PCP:  No PCP Per Patient  Primary Electrophysiologist:  Andreah Goheen Meredith Leeds, MD    Chief Complaint  Patient presents with  . Advice Only    SVT     History of Present Illness: Janet Sherman is a 66 y.o. female who presents today for electrophysiology evaluation.   She has a history of recent craniotomy for cherry tumor, tobacco abuse, gallstones presented to the emergency room with a right flank rash NSVT. She had a vesicular rash to her right flank times one week. When in the ER triage, she went into SVT which broke with 5 mg diltiazem. She later had recurrent SVT. She was further diagnosed with shingles. He does say that she has occasional palpitations. She knows of no exacerbating or alleviating factors.   Today, she denies symptoms of palpitations, chest pain, shortness of breath, orthopnea, PND, lower extremity edema, claudication, dizziness, presyncope, syncope, bleeding, or neurologic sequela. The patient is tolerating medications without difficulties and is otherwise without complaint today.    Past Medical History:  Diagnosis Date  . Gallstones    Past Surgical History:  Procedure Laterality Date  . CRANIOTOMY N/A 10/16/2016   Procedure: CRANIOTOMY HYPOPHYSECTOMY TRANSNASAL APPROACH;  Surgeon: Consuella Lose, MD;  Location: [facility];  Service: Neurosurgery;  Laterality: N/A;  . gallstones     15 years ago  . TRANSNASAL APPROACH N/A 10/16/2016   Procedure: TRANSNASAL APPROACH WITH FUSION;  Surgeon: Jerrell Belfast, MD;  Location: [facility];  Service: ENT;  Laterality: N/A;     Current Outpatient Prescriptions  Medication Sig Dispense Refill  . acetaminophen-codeine (TYLENOL #3) 300-30 MG tablet Take 1 tablet by mouth every 6 (six) hours as needed for moderate pain. 40 tablet 0  . aspirin 81 MG chewable tablet Chew 1 tablet (81 mg total) by mouth daily. 30 tablet 0  .  atorvastatin (LIPITOR) 20 MG tablet Take 1 tablet (20 mg total) by mouth daily. 30 tablet 0  . gabapentin (NEURONTIN) 100 MG capsule Take 1 capsule (100 mg total) by mouth 3 (three) times daily. 15 capsule 0   No current facility-administered medications for this visit.     Allergies:   No known allergies   Social History:  The patient  reports that she has been smoking Cigarettes.  She has been smoking about 0.25 packs per day. She has never used smokeless tobacco. She reports that she does not drink alcohol or use drugs.   Family History:  The patient's family history includes Cancer in her father; Hypertension in her brother, mother, and sister.    ROS:  Please see the history of present illness.   Otherwise, review of systems is positive for none.   All other systems are reviewed and negative.    PHYSICAL EXAM: VS:  BP 140/86   Pulse 64   Ht 5\' 5"  (1.651 m)   Wt 115 lb (52.2 kg)   BMI 19.14 kg/m  , BMI Body mass index is 19.14 kg/m. GEN: Well nourished, well developed, in no acute distress  HEENT: normal  Neck: no JVD, carotid bruits, or masses Cardiac: RRR; no murmurs, rubs, or gallops,no edema  Respiratory:  clear to auscultation bilaterally, normal work of breathing GI: soft, nontender, nondistended, + BS MS: no deformity or atrophy  Skin: warm and dry Neuro:  Strength and sensation are intact Psych: euthymic mood, full affect  EKG:  EKG is ordered today. Personal review of the ekg ordered 12/15/16 shows SVT rate 153  Recent Labs: 12/25/2016: Magnesium 2.0; TSH 3.148 12/26/2016: BUN 8; Creatinine, Ser 0.73; Hemoglobin 12.0; Platelets 437; Potassium 4.7; Sodium 142    Lipid Panel     Component Value Date/Time   CHOL 187 12/26/2016 0246   TRIG 52 12/26/2016 0246   HDL 51 12/26/2016 0246   CHOLHDL 3.7 12/26/2016 0246   VLDL 10 12/26/2016 0246   LDLCALC 126 (H) 12/26/2016 0246     Wt Readings from Last 3 Encounters:  01/04/17 115 lb (52.2 kg)  12/26/16 128  lb 12 oz (58.4 kg)  10/16/16 106 lb 0.7 oz (48.1 kg)      Other studies Reviewed: Additional studies/ records that were reviewed today include: TTE 12/26/16  Review of the above records today demonstrates:  - Left ventricle: LVEF is mildly depressed with hypokinesis of the   mid/distal anteriorand distal anterolateral walls The cavity size   was normal. Wall thickness was normal. Systolic function was   mildly reduced. The estimated ejection fraction is approxiamately   50%. Doppler parameters are consistent with abnormal left   ventricular relaxation (grade 1 diastolic dysfunction). - Mitral valve: There was mild regurgitation. - Right atrium: The atrium was mildly dilated. - Pulmonary arteries: PA peak pressure: 36 mm Hg (S).    ASSESSMENT AND PLAN:  1.  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 She currently feels well and has had no further episodes. I discussed with her the options of medical management versus ablation. Risks of ablation were discussed. Risks include bleeding, tamponade, heart block, and stroke among others. She understands these risks and has agreed to ablation.  2. Hyperlipidemia: Continue Lipitor  3. Elevated blood pressure: Unclear she actually does have hypertension. Check blood pressures prior to therapy.   Current medicines are reviewed at length with the patient today.   The patient does not have concerns regarding her medicines.  The following changes were made today:  none  Labs/ tests ordered today include:  No orders of the defined types were placed in this encounter.    Disposition:   FU with Ritu Gagliardo 8 weeks  Signed, Jalayne Ganesh Meredith Leeds, MD  01/04/2017 3:00 PM     [address] [place] [address] [phone] (office) [phone] (fax)

## 2017-01-04 NOTE — Patient Instructions (Signed)
Medication Instructions:    Your physician recommends that you continue on your current medications as directed. Please refer to the Current Medication list given to you today.  --- If you need a refill on your cardiac medications before your next appointment, please call your pharmacy. ---  Labwork:  Your physician recommends that you return for pre procedure lab work between 01/11/17 - 01/20/17.  Testing/Procedures: Your physician has recommended that you have an ablation. Catheter ablation is a medical procedure used to treat some cardiac arrhythmias (irregular heartbeats). During catheter ablation, a long, thin, flexible tube is put into a blood vessel in your groin (upper thigh), or neck. This tube is called an ablation catheter. It is then guided to your heart through the blood vessel. Radio frequency waves destroy small areas of heart tissue where abnormal heartbeats may cause an arrhythmia to start. Please see the instruction sheet given to you today.  Follow-Up:  Your physician recommends that you schedule a follow-up appointment in: 4 weeks, after your procedure on 01/22/17, with Dr. Curt Bears.  Thank you for choosing [facility]!!   Trinidad Curet, RN [phone]  Any Other Special Instructions Will Be Listed Below (If Applicable).   Cardiac Ablation Cardiac ablation is a procedure to stop some heart tissue from causing problems. The heart has many electrical connections. Sometimes these connections cause the heart to beat very fast or irregularly. Removing some of the problem areas can improve heart rhythm or make it normal. Ablation is done for people who:  Have Wolff-Parkinson-White syndrome.  Have other fast heart rhythms (tachycardia).  Have taken medicines for an abnormal heart rhythm (arrhythmia) and the medicines had:  No success.  Side effects.  May have a type of heartbeat that could cause death. What happens before the procedure?  Follow instructions  from your doctor about eating and drinking before the procedure.  Take your medicines as told by your doctor. Take them at regular times with water unless told differently by your doctor.  If you are taking diabetes medicine, ask your doctor how to take it. Ask if there are any special instructions you should follow. Your doctor may change how much insulin you take the day of the procedure. What happens during the procedure?  A special type of X-ray will be used. The X-ray helps your doctor see images of your heart during the procedure.  A small cut (incision) will be made in your neck or groin.  An IV tube will be started before the procedure begins.  You will be given a numbing medicine (anesthetic) or a medicine to help you relax (sedative).  The skin on your neck or groin will be numbed.  A needle will be put into a large vein in your neck or groin.  A thin, flexible tube (catheter) will be put in to reach your heart.  A dye will be put in the tube. The dye will show up on X-rays. It will help your doctor see the area of the heart that needs treatment.  When the heart tissue that is causing problems is found, the tip of the tube will send an electrical current to it. This will stop it from causing problems.  The tube will be taken out.  Pressure will be put on the area where the tube was. This will keep it from bleeding. A bandage will be placed over the area. What happens after the procedure?  You will be taken to a recovery area. Your blood pressure, heart  rate, and breathing will be watched. The area where the tube was will also be watched for bleeding.  You will need to lie still for 4-6 hours. This keeps the area where the tube was from bleeding. This information is not intended to replace advice given to you by your health care provider. Make sure you discuss any questions you have with your health care provider. Document Released: 08/16/2013 Document Revised: 05/21/2016  Document Reviewed: 05/11/2013 Elsevier Interactive Patient Education  2017 [employer].

## 2017-01-11 ENCOUNTER — Telehealth: Payer: Self-pay | Admitting: *Deleted

## 2017-01-11 NOTE — Telephone Encounter (Signed)
Informed patient procedure time moved to 11:30 am on the 26th.  Arrive a hospital at 9:30 am. Patient verbalized understanding and agreeable to plan.

## 2017-01-15 ENCOUNTER — Other Ambulatory Visit: Payer: BLUE CROSS/BLUE SHIELD | Admitting: *Deleted

## 2017-01-15 DIAGNOSIS — I471 Supraventricular tachycardia: Secondary | ICD-10-CM

## 2017-01-15 DIAGNOSIS — Z01812 Encounter for preprocedural laboratory examination: Secondary | ICD-10-CM

## 2017-01-16 LAB — BASIC METABOLIC PANEL
BUN/Creatinine Ratio: 7 — ABNORMAL LOW (ref 12–28)
BUN: 7 mg/dL — ABNORMAL LOW (ref 8–27)
CO2: 23 mmol/L (ref 18–29)
CREATININE: 0.95 mg/dL (ref 0.57–1.00)
Calcium: 9.7 mg/dL (ref 8.7–10.3)
Chloride: 100 mmol/L (ref 96–106)
GFR calc Af Amer: 73 mL/min/{1.73_m2} (ref >59)
GFR, EST NON AFRICAN AMERICAN: 63 mL/min/{1.73_m2} (ref >59)
Glucose: 103 mg/dL — ABNORMAL HIGH (ref 65–99)
POTASSIUM: 4 mmol/L (ref 3.5–5.2)
SODIUM: 142 mmol/L (ref 134–144)

## 2017-01-16 LAB — CBC WITH DIFFERENTIAL/PLATELET
BASOS: 0 %
Basophils Absolute: 0 10*3/uL (ref 0.0–0.2)
EOS (ABSOLUTE): 0.3 10*3/uL (ref 0.0–0.4)
EOS: 3 %
HEMATOCRIT: 39 % (ref 34.0–46.6)
Hemoglobin: 13 g/dL (ref 11.1–15.9)
IMMATURE GRANS (ABS): 0 10*3/uL (ref 0.0–0.1)
IMMATURE GRANULOCYTES: 0 %
Lymphocytes Absolute: 1.9 10*3/uL (ref 0.7–3.1)
Lymphs: 20 %
MCH: 26.8 pg (ref 26.6–33.0)
MCHC: 33.3 g/dL (ref 31.5–35.7)
MCV: 80 fL (ref 79–97)
MONOS ABS: 0.6 10*3/uL (ref 0.1–0.9)
Monocytes: 6 %
NEUTROS ABS: 7 10*3/uL (ref 1.4–7.0)
Neutrophils: 71 %
PLATELETS: 660 10*3/uL — AB (ref 150–379)
RBC: 4.85 x10E6/uL (ref 3.77–5.28)
RDW: 13.9 % (ref 12.3–15.4)
WBC: 9.9 10*3/uL (ref 3.4–10.8)

## 2017-01-19 DIAGNOSIS — J0141 Acute recurrent pansinusitis: Secondary | ICD-10-CM | POA: Insufficient documentation

## 2017-01-19 DIAGNOSIS — J3489 Other specified disorders of nose and nasal sinuses: Secondary | ICD-10-CM | POA: Insufficient documentation

## 2017-01-22 ENCOUNTER — Encounter (HOSPITAL_COMMUNITY): Payer: Self-pay | Admitting: General Practice

## 2017-01-22 ENCOUNTER — Encounter (HOSPITAL_COMMUNITY)
Admission: RE | Disposition: A | Discharge status: Home / Self Care | Payer: Self-pay | Source: Ambulatory Visit | Attending: Cardiology

## 2017-01-22 ENCOUNTER — Ambulatory Visit (HOSPITAL_COMMUNITY)
Admission: RE | Admit: 2017-01-22 | Discharge: 2017-01-23 | Disposition: A | Discharge status: Home / Self Care | Payer: BLUE CROSS/BLUE SHIELD | Source: Ambulatory Visit | Attending: Cardiology | Admitting: Cardiology

## 2017-01-22 DIAGNOSIS — I471 Supraventricular tachycardia, unspecified: Secondary | ICD-10-CM | POA: Diagnosis present

## 2017-01-22 DIAGNOSIS — E785 Hyperlipidemia, unspecified: Secondary | ICD-10-CM | POA: Insufficient documentation

## 2017-01-22 DIAGNOSIS — Z85841 Personal history of malignant neoplasm of brain: Secondary | ICD-10-CM | POA: Insufficient documentation

## 2017-01-22 DIAGNOSIS — Z87891 Personal history of nicotine dependence: Secondary | ICD-10-CM | POA: Insufficient documentation

## 2017-01-22 HISTORY — PX: ELECTROPHYSIOLOGIC STUDY: SHX172A

## 2017-01-22 HISTORY — PX: SUPRAVENTRICULAR TACHYCARDIA ABLATION: SHX6106

## 2017-01-22 HISTORY — DX: Unspecified osteoarthritis, unspecified site: M19.90

## 2017-01-22 HISTORY — DX: Personal history of urinary calculi: Z87.442

## 2017-01-22 HISTORY — DX: Pneumonia, unspecified organism: J18.9

## 2017-01-22 HISTORY — DX: Migraine, unspecified, not intractable, without status migrainosus: G43.909

## 2017-01-22 SURGERY — ELECTROPHYSIOLOGY STUDY
Anesthesia: LOCAL

## 2017-01-22 MED ORDER — ACETAMINOPHEN 325 MG PO TABS: 650.0000 mg | ORAL_TABLET | ORAL | Status: DC | PRN

## 2017-01-22 MED ORDER — AMOXICILLIN-POT CLAVULANATE 500-125 MG PO TABS
1.0000 | ORAL_TABLET | Freq: Two times a day (BID) | ORAL | Status: DC
  Administered 2017-01-22 – 2017-01-23 (×2): 500 mg via ORAL
  Filled 2017-01-22 (×2): qty 1

## 2017-01-22 MED ORDER — ADULT MULTIVITAMIN W/MINERALS CH
1.0000 | ORAL_TABLET | Freq: Every day | ORAL | Status: DC
  Administered 2017-01-23: 1 via ORAL
  Filled 2017-01-22: qty 1

## 2017-01-22 MED ORDER — MIDAZOLAM HCL 5 MG/5ML IJ SOLN
INTRAMUSCULAR | Status: DC | PRN
  Administered 2017-01-22 (×4): 1 mg via INTRAVENOUS

## 2017-01-22 MED ORDER — ONDANSETRON HCL 4 MG/2ML IJ SOLN: 4.0000 mg | Freq: Four times a day (QID) | INTRAMUSCULAR | Status: DC | PRN

## 2017-01-22 MED ORDER — SODIUM CHLORIDE 0.9 % IV SOLN
250.0000 mL | INTRAVENOUS | Status: DC | PRN
Start: 2017-01-22 — End: 2017-01-23

## 2017-01-22 MED ORDER — ATORVASTATIN CALCIUM 20 MG PO TABS
20.0000 mg | ORAL_TABLET | Freq: Every day | ORAL | Status: DC
  Administered 2017-01-22: 20 mg via ORAL
  Filled 2017-01-22: qty 1

## 2017-01-22 MED ORDER — ISOPROTERENOL HCL 0.2 MG/ML IJ SOLN
INTRAMUSCULAR | Status: AC
  Filled 2017-01-22: qty 5

## 2017-01-22 MED ORDER — HEPARIN (PORCINE) IN NACL 2-0.9 UNIT/ML-% IJ SOLN
INTRAMUSCULAR | Status: AC
  Filled 2017-01-22: qty 500

## 2017-01-22 MED ORDER — MIDAZOLAM HCL 5 MG/5ML IJ SOLN
INTRAMUSCULAR | Status: AC
  Filled 2017-01-22: qty 5

## 2017-01-22 MED ORDER — INFLUENZA VAC SPLIT QUAD 0.5 ML IM SUSY
0.5000 mL | PREFILLED_SYRINGE | INTRAMUSCULAR | Status: DC
  Filled 2017-01-22: qty 0.5

## 2017-01-22 MED ORDER — BUPIVACAINE HCL (PF) 0.25 % IJ SOLN
INTRAMUSCULAR | Status: AC
  Filled 2017-01-22: qty 30

## 2017-01-22 MED ORDER — SODIUM CHLORIDE 0.9% FLUSH: 3.0000 mL | INTRAVENOUS | Status: DC | PRN

## 2017-01-22 MED ORDER — SODIUM CHLORIDE 0.9 % IV SOLN
INTRAVENOUS | Status: DC | PRN
  Administered 2017-01-22: 2 ug/min via INTRAVENOUS

## 2017-01-22 MED ORDER — BUPIVACAINE HCL (PF) 0.25 % IJ SOLN
INTRAMUSCULAR | Status: DC | PRN
  Administered 2017-01-22: 50 mL

## 2017-01-22 MED ORDER — FENTANYL CITRATE (PF) 100 MCG/2ML IJ SOLN
INTRAMUSCULAR | Status: DC | PRN
  Administered 2017-01-22 (×3): 25 ug via INTRAVENOUS

## 2017-01-22 MED ORDER — HEPARIN (PORCINE) IN NACL 2-0.9 UNIT/ML-% IJ SOLN
INTRAMUSCULAR | Status: DC | PRN
  Administered 2017-01-22: 1000 mL

## 2017-01-22 MED ORDER — SODIUM CHLORIDE 0.9% FLUSH
3.0000 mL | Freq: Two times a day (BID) | INTRAVENOUS | Status: DC
  Administered 2017-01-22: 3 mL via INTRAVENOUS

## 2017-01-22 MED ORDER — METOPROLOL TARTRATE 25 MG PO TABS
25.0000 mg | ORAL_TABLET | Freq: Two times a day (BID) | ORAL | Status: DC
  Administered 2017-01-22 – 2017-01-23 (×2): 25 mg via ORAL
  Filled 2017-01-22 (×2): qty 1

## 2017-01-22 MED ORDER — FENTANYL CITRATE (PF) 100 MCG/2ML IJ SOLN
INTRAMUSCULAR | Status: AC
  Filled 2017-01-22: qty 2

## 2017-01-22 MED ORDER — IBUPROFEN 200 MG PO TABS: 200.0000 mg | ORAL_TABLET | Freq: Four times a day (QID) | ORAL | Status: DC | PRN

## 2017-01-22 SURGICAL SUPPLY — 15 items
BAG SNAP BAND KOVER 36X36 (MISCELLANEOUS) ×2 IMPLANT
BLANKET WARM UNDERBOD FULL ACC (MISCELLANEOUS) ×2 IMPLANT
CATH HEX JOSEPH 2-5-2 65CM 6F (CATHETERS) ×1 IMPLANT
CATH HEXAPOLAR DAMATO 6F (CATHETERS) ×1 IMPLANT
CATH JOSEPHSON QUAD-ALLRED 6FR (CATHETERS) ×3 IMPLANT
COVER PRB 48X5XTLSCP FOLD TPE (BAG) IMPLANT
COVER PROBE 5X48 (BAG) ×2
PACK EP LATEX FREE (CUSTOM PROCEDURE TRAY) ×2
PACK EP LF (CUSTOM PROCEDURE TRAY) ×1 IMPLANT
PAD DEFIB LIFELINK (PAD) ×2 IMPLANT
PATCH CARTO3 (PAD) ×1 IMPLANT
SHEATH PINNACLE 6F 10CM (SHEATH) ×2 IMPLANT
SHEATH PINNACLE 7F 10CM (SHEATH) ×1 IMPLANT
SHEATH PINNACLE 8F 10CM (SHEATH) ×1 IMPLANT
SHIELD RADPAD SCOOP 12X17 (MISCELLANEOUS) ×2 IMPLANT

## 2017-01-22 NOTE — Care Management Note (Signed)
Case Management Note  Patient Details  Name: Janet Sherman MRN: [id number] Date of Birth: 15-Jun-1951  Subjective/Objective:  S/p SVT ablation, NCM will cont to follow for dc needs.                  Action/Plan:   Expected Discharge Date:                  Expected Discharge Plan:  Home/Self Care  In-House Referral:     Discharge planning Services  CM Consult  Post Acute Care Choice:    Choice offered to:     DME Arranged:    DME Agency:     HH Arranged:    HH Agency:     Status of Service:  Completed, signed off  If discussed at [employer] of Stay Meetings, dates discussed:    Additional Comments:  Zenon Mayo, RN 01/22/2017, 4:30 PM

## 2017-01-22 NOTE — H&P (Signed)
Janet Sherman is a 66 y.o. female who presents with a history of SVT. Her SVT has responded to adenosine in the past.  On exam, regular rhythm, no murmurs, lungs clear.  She presents today for ablation.  Risks and benefits discussed. Risks include but not limited to bleeding, tamponade, heart block, and stroke.  She understands the risks and has agreed to the procedure.  Will Curt Bears, MD 01/22/2017 12:00 PM

## 2017-01-22 NOTE — Discharge Summary (Signed)
ELECTROPHYSIOLOGY PROCEDURE DISCHARGE SUMMARY    Patient ID: Janet Sherman,  MRN: [id number], DOB/AGE: 1951/07/25 66 y.o.  Admit date: 01/22/2017 Discharge date: 01/23/2017  Primary Care Physician: No PCP Per Patient  Primary Cardiologist: (new last admission Dec 2016) Dr. Harrington Challenger Electrophysiologist: Dr. Curt Bears  Primary Discharge Diagnosis:  1. SVT  Secondary Diagnosis: 1. HLD   Allergies  Allergen Reactions  . No Known Allergies     Procedures This Admission: 1.  Electrophysiology study and radiofrequency catheter ablation on 01/22/17 by Dr Curt Bears.  This study demonstrated: CONCLUSIONS:  1. Sinus rhythm upon presentation.  2. Negative EP study 3. No inducible arrhythmias. 4. No early apparent complications.   Brief HPI: Janet Sherman is a 66 y.o. female with a past medical history as outlined above. She had documented SVT at her hospital stay in December 2016. Risks, benefits, and alternatives to ablation were reviewed with the patient who wished to proceed.   Hospital Course:  The patient was admitted and underwent EPS without an inducible arrhythmia.  She was monitored on telemetry overnight which demonstrated NSR with rates in the 60-70's with no ectopic events .  Groin and neck incisions were without complication. The patient was examined by Dr. Caryl Comes  and considered stable for discharge to home.  Routine follow up has been arranged with Dr. Curt Bears in 4 weeks.  Wound care and restrictions were reviewed with the patient prior to discharge.  She has been started on Metoprolol 25mg  BID.  Physical Exam: Vitals:   01/22/17 1930 01/22/17 2000 01/23/17 0412 01/23/17 0715  BP: 125/79 114/71 121/73 114/78  Pulse: 88  72 72  Resp: (!) 25 19 19 18   Temp: 98 F (36.7 C)  98.3 F (36.8 C) 97.9 F (36.6 C)  TempSrc: Oral  Oral Oral  SpO2: 100%  96% 96%  Weight:   115 lb 1.3 oz (52.2 kg)   Height:        GEN- The patient is well appearing, alert and oriented x 3 today.    HEENT: normocephalic, atraumatic; sclera clear, conjunctiva pink; hearing intact; oropharynx clear; neck supple, no JVP, right IJ site stable Lymph- no cervical lymphadenopathy Lungs- CTA b/l, normal work of breathing.  No wheezes, rales, rhonchi Heart- RRR, no murmurs, rubs or gallops, PMI not laterally displaced GI- soft, non-tender, non-distended, bowel sounds present, no hepatosplenomegaly Extremities- no clubbing, cyanosis, or edema; DP/PT/radial pulses 2+ bilaterally, groin without hematoma/bruit MS- no significant deformity or atrophy Skin- warm and dry, no rash or lesion Psych- euthymic mood, full affect Neuro- strength and sensation are intact  Labs:   Lab Results  Component Value Date   WBC 9.9 01/15/2017   HGB 12.0 12/26/2016   HCT 39.0 01/15/2017   MCV 80 01/15/2017   PLT 660 (H) 01/15/2017   No results for input(s): NA, K, CL, CO2, BUN, CREATININE, CALCIUM, PROT, BILITOT, ALKPHOS, ALT, AST, GLUCOSE in the last 168 hours.  Invalid input(s): LABALBU  Discharge Medications:  Allergies as of 01/23/2017      Reactions   No Known Allergies       Medication List    TAKE these medications   amoxicillin-clavulanate 500-125 MG tablet Commonly known as:  AUGMENTIN Take 1 tablet by mouth 2 (two) times daily. For 10 days   aspirin 81 MG chewable tablet Chew 1 tablet (81 mg total) by mouth daily. What changed:  when to take this  reasons to take this   atorvastatin 20 MG tablet Commonly known  as:  LIPITOR Take 1 tablet (20 mg total) by mouth daily.   ibuprofen 200 MG tablet Commonly known as:  ADVIL,MOTRIN Take 200 mg by mouth every 6 (six) hours as needed for moderate pain.   metoprolol tartrate 25 MG tablet Commonly known as:  LOPRESSOR Take 1 tablet (25 mg total) by mouth 2 (two) times daily.   multivitamin with minerals Tabs tablet Take 1 tablet by mouth daily.       Disposition: Home Discharge Instructions    Diet - low sodium heart healthy     Complete by:  As directed    Increase activity slowly    Complete by:  As directed      Follow-up Information    Will Meredith Leeds, MD Follow up on 02/23/2017.   Specialty:  Cardiology Why:  11:30AM Contact information: [place] [address] [phone]           Duration of Discharge Encounter: Greater than 30 minutes including physician time.   Signed, Erma Heritage, PA-C 01/23/2017, 9:22 AM Pager: [phone]  Patient seen and examined study results reviewed

## 2017-01-22 NOTE — Progress Notes (Signed)
Site area: rt IJ venous sheath Site Prior to Removal:  Level 0 Pressure Applied For: 10 minutes Manual:   yes Patient Status During Pull:  stable Post Pull Site:  Level 0 Post Pull Instructions Given:  yes Post Pull Pulses Present: yes Dressing Applied:  Gauze/tegaderm Bedrest begins @  Comments:

## 2017-01-22 NOTE — Discharge Instructions (Signed)
No driving for 1 week. No lifting over 5 lbs for 1 week. No sexual activity for 1 week. You may return to work on 01/29/17. Keep procedure site clean & dry. If you notice increased pain, swelling, bleeding or pus, call/return!  You may shower, but no soaking baths/hot tubs/pools for 1 week.

## 2017-01-22 NOTE — Progress Notes (Signed)
Site area: rt groin 3 fv sheaths Site Prior to Removal:  Level 0 Pressure Applied For: 15 minutes Manual:   yes Patient Status During Pull:  stable Post Pull Site:  Level 0 Post Pull Instructions Given:  yes Post Pull Pulses Present: yes Dressing Applied:  Yes-gauze and tegaderm Bedrest begins @ [id number] Comments:

## 2017-01-23 DIAGNOSIS — Z87891 Personal history of nicotine dependence: Secondary | ICD-10-CM | POA: Diagnosis not present

## 2017-01-23 DIAGNOSIS — Z85841 Personal history of malignant neoplasm of brain: Secondary | ICD-10-CM | POA: Diagnosis not present

## 2017-01-23 DIAGNOSIS — E785 Hyperlipidemia, unspecified: Secondary | ICD-10-CM | POA: Diagnosis not present

## 2017-01-23 DIAGNOSIS — I471 Supraventricular tachycardia: Secondary | ICD-10-CM | POA: Diagnosis not present

## 2017-01-23 MED ORDER — METOPROLOL TARTRATE 25 MG PO TABS: 25.0000 mg | ORAL_TABLET | Freq: Two times a day (BID) | ORAL | 6 refills | Status: DC

## 2017-01-23 NOTE — Progress Notes (Signed)
Progress Note  Patient Name: Janet Sherman Date of Encounter: 01/23/2017  Primary Electophysiologist: Dr. Curt Bears  Subjective   No chest discomfort, palpitations, or dyspnea overnight. Ambulated down the hallway earlier this morning without difficulty.   Inpatient Medications    Scheduled Meds: . amoxicillin-clavulanate  1 tablet Oral BID  . atorvastatin  20 mg Oral q1800  . Influenza vac split quadrivalent PF  0.5 mL Intramuscular Tomorrow-1000  . metoprolol tartrate  25 mg Oral BID  . multivitamin with minerals  1 tablet Oral Daily  . sodium chloride flush  3 mL Intravenous Q12H   Continuous Infusions:  PRN Meds: sodium chloride, acetaminophen, ibuprofen, ondansetron (ZOFRAN) IV, sodium chloride flush   Vital Signs    Vitals:   01/22/17 1930 01/22/17 2000 01/23/17 0412 01/23/17 0715  BP: 125/79 114/71 121/73 114/78  Pulse: 88  72 72  Resp: (!) 25 19 19 18   Temp: 98 F (36.7 C)  98.3 F (36.8 C) 97.9 F (36.6 C)  TempSrc: Oral  Oral Oral  SpO2: 100%  96% 96%  Weight:   115 lb 1.3 oz (52.2 kg)   Height:        Intake/Output Summary (Last 24 hours) at 01/23/17 0802 Last data filed at 01/23/17 [id number]  Gross per 24 hour  Intake              480 ml  Output             1250 ml  Net             -770 ml   Filed Weights   01/22/17 0903 01/23/17 0412  Weight: 125 lb (56.7 kg) 115 lb 1.3 oz (52.2 kg)    Telemetry    NSR, HR in 60's - 80's. No ectopic events.  - Personally Reviewed  ECG    NSR, HR 70, QTc 427 ms.  - Personally Reviewed  Physical Exam   General: Well developed, well nourished African American female appearing in no acute distress. Head: Normocephalic, atraumatic.  Neck: Supple without bruits, JVD not elevated. Site stable with no ecchymosis.  Lungs:  Resp regular and unlabored, CTA. Heart: RRR, S1, S2, no S3, S4, or murmur; no rub. Abdomen: Soft, non-tender, non-distended with normoactive bowel sounds. No hepatomegaly. No rebound/guarding. No  obvious abdominal masses. Extremities: No clubbing, cyanosis, or edema. Distal pedal pulses are 2+ bilaterally. Right groin site without ecchymosis or evidence of a hematoma. Neuro: Alert and oriented X 3. Moves all extremities spontaneously. Psych: Normal affect.  Labs    ChemistryNo results for input(s): NA, K, CL, CO2, GLUCOSE, BUN, CREATININE, CALCIUM, PROT, ALBUMIN, AST, ALT, ALKPHOS, BILITOT, GFRNONAA, GFRAA, ANIONGAP in the last 168 hours.   HematologyNo results for input(s): WBC, RBC, HGB, HCT, MCV, MCH, MCHC, RDW, PLT in the last 168 hours.  Cardiac EnzymesNo results for input(s): TROPONINI in the last 168 hours. No results for input(s): TROPIPOC in the last 168 hours.   BNPNo results for input(s): BNP, PROBNP in the last 168 hours.   DDimer No results for input(s): DDIMER in the last 168 hours.   Radiology    No results found.  Cardiac Studies   SVT Ablation/EP study:  Ventricular pacing was performed which reveals midline concentric decremental VA conduction without a retrograde jump. The VA WCL was 310 msec and a VERP 600/250 msec.  Rapid atrial pacing was performed with reveals PR >> RR with tachycardia not induced at 320 msec. AEST was performed which revealed a  single prolonged AH jumps without echo beats. Tachycardia was not induced. The AVNERP was 600/240 msec, 400/210 msec.  Extrastimuli with drive trains of [id number], 500, and 600 msec were repeated without tachycardia. Isuprel was infused at 82mcg/min with an adequate acceleration in HR response observed. PR remained >> RR. Tachycardia was not induced. AEST was performed which again revealed no AH jumps with multiple drive trains. Burst pacing was performed at drives of [id number], [id number], [id number], 270, and 250 msec without tachycardia.  CONCLUSIONS:  1. Sinus rhythm upon presentation.  2. Negative EP study 3. No inducible arrhythmias. 4. No early apparent complications.   During this procedure the patient is administered a total of  Versed 4 mg and Fentanyl 75 mg to achieve and maintain moderate conscious sedation. The patient's heart rate, blood pressure, and oxygen saturation are monitored continuously during the procedure. The period of conscious sedation is 90 minutes, of which I was present face-to-face 100% of this time.  Patient Profile     66 y.o. female with past medical history of craniotomy (for cherry tumor), tobacco use, and SVT who was referred to EP for further evaluation.   Assessment & Plan    1. 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  - Ablation was recommended and she presented to [facility] on 1/26 for the procedure. EPS was without an inducible arrhythmia. Telemetry showing NSR overnight with no significant events.  - has been started on Lopressor 25mg  BID.   2. Hyperlipidemia - continue statin therapy.   Arna Medici , PA-C 8:02 AM 01/23/2017 Pager: [phone]   Seen and results reviewed  [facility] for discharge

## 2017-01-25 ENCOUNTER — Encounter (HOSPITAL_COMMUNITY): Payer: Self-pay | Admitting: Cardiology

## 2017-02-15 ENCOUNTER — Telehealth: Payer: Self-pay

## 2017-02-15 NOTE — Telephone Encounter (Signed)
Patient's daughter works downstairs in lab and is wanting to know if her mother can establish care with you----please advise, I will let daughter know

## 2017-02-15 NOTE — Telephone Encounter (Signed)
Ok Thx 

## 2017-02-16 NOTE — Telephone Encounter (Signed)
Patient has scheduled appt to get established with dr Alain Marion on 3/13

## 2017-02-23 ENCOUNTER — Encounter: Payer: Self-pay | Admitting: Cardiology

## 2017-02-23 ENCOUNTER — Ambulatory Visit (INDEPENDENT_AMBULATORY_CARE_PROVIDER_SITE_OTHER): Payer: BLUE CROSS/BLUE SHIELD | Admitting: Cardiology

## 2017-02-23 ENCOUNTER — Encounter (INDEPENDENT_AMBULATORY_CARE_PROVIDER_SITE_OTHER): Payer: Self-pay

## 2017-02-23 VITALS — BP 130/90 | HR 56 | Ht 65.0 in | Wt 119.8 lb

## 2017-02-23 DIAGNOSIS — I471 Supraventricular tachycardia: Secondary | ICD-10-CM

## 2017-02-23 NOTE — Progress Notes (Signed)
Electrophysiology Office Note   Date:  02/23/2017   ID:  Janet Sherman, DOB Mar 13, 1951, MRN [id number]  PCP:  No PCP Per Patient  Primary Electrophysiologist:  Janet Huelsmann Meredith Leeds, MD    Chief Complaint  Patient presents with  . Follow-up    SVT     History of Present Illness: Janet Sherman is a 66 y.o. female who presents today for electrophysiology evaluation.   She has a history of recent craniotomy for cherry tumor, tobacco abuse, gallstones presented to the emergency room with a right flank rash NSVT. She had a vesicular rash to her right flank times one week. When in the ER triage, she went into SVT which broke with 5 mg diltiazem. She later had recurrent SVT. She was further diagnosed with shingles. Had negative EP study 01/22/17.   Today, she denies symptoms of palpitations, chest pain, shortness of breath, orthopnea, PND, lower extremity edema, claudication, dizziness, presyncope, syncope, bleeding, or neurologic sequela. The patient is tolerating medications without difficulties and is otherwise without complaint today.    Past Medical History:  Diagnosis Date  . Arthritis    "right hand" (01/22/2017)  . Gallstones   . History of kidney stones   . Migraine    "a few/year" (01/22/2017)  . Pneumonia X 1   Past Surgical History:  Procedure Laterality Date  . CRANIOTOMY N/A 10/16/2016   Procedure: CRANIOTOMY HYPOPHYSECTOMY TRANSNASAL APPROACH;  Surgeon: Janet Lose, MD;  Location: [place];  Service: Neurosurgery;  Laterality: N/A;  . ELECTROPHYSIOLOGIC STUDY N/A 01/22/2017   Procedure: SVT Ablation;  Surgeon: Janet Lindo Meredith Leeds, MD;  Location: [place] [facility];  Service: Cardiovascular;  Laterality: N/A;  . ELECTROPHYSIOLOGIC STUDY N/A 01/22/2017   Procedure: Electrophysiology Study;  Surgeon: Janet Goodnow Meredith Leeds, MD;  Location: [place] [facility];  Service: Cardiovascular;  Laterality: N/A;  . gallstones  ~ 2000  . KIDNEY STONE SURGERY     "cut me open"  .  SUPRAVENTRICULAR TACHYCARDIA ABLATION  01/22/2017  . TRANSNASAL APPROACH N/A 10/16/2016   Procedure: TRANSNASAL APPROACH WITH FUSION;  Surgeon: Janet Belfast, MD;  Location: [place];  Service: ENT;  Laterality: N/A;  . TUBAL LIGATION       Current Outpatient Prescriptions  Medication Sig Dispense Refill  . aspirin 81 MG chewable tablet Chew 1 tablet (81 mg total) by mouth daily. (Patient taking differently: Chew 81 mg by mouth daily as needed for mild pain. ) 30 tablet 0  . atorvastatin (LIPITOR) 20 MG tablet Take 1 tablet (20 mg total) by mouth daily. 30 tablet 0  . ibuprofen (ADVIL,MOTRIN) 200 MG tablet Take 200 mg by mouth every 6 (six) hours as needed for moderate pain.    . metoprolol tartrate (LOPRESSOR) 25 MG tablet Take 1 tablet (25 mg total) by mouth 2 (two) times daily. 60 tablet 6  . Multiple Vitamin (MULTIVITAMIN WITH MINERALS) TABS tablet Take 1 tablet by mouth daily.     No current facility-administered medications for this visit.     Allergies:   No known allergies   Social History:  The patient  reports that she quit smoking about 4 months ago. Her smoking use included Cigarettes. She smoked 1.00 pack per day. She has never used smokeless tobacco. She reports that she does not drink alcohol or use drugs.   Family History:  The patient's family history includes Cancer in her father; Hypertension in her brother, mother, and sister.    ROS:  Please see the history of present  illness.   Otherwise, review of systems is positive for none.   All other systems are reviewed and negative.    PHYSICAL EXAM: VS:  BP 130/90   Pulse (!) 56   Ht 5\' 5"  (1.651 m)   Wt 119 lb 12.8 oz (54.3 kg)   BMI 19.94 kg/m  , BMI Body mass index is 19.94 kg/m. GEN: Well nourished, well developed, in no acute distress  HEENT: normal  Neck: no JVD, carotid bruits, or masses Cardiac: RRR; no murmurs, rubs, or gallops,no edema  Respiratory:  clear to auscultation bilaterally, normal work of  breathing GI: soft, nontender, nondistended, + BS MS: no deformity or atrophy  Skin: warm and dry Neuro:  Strength and sensation are intact Psych: euthymic mood, full affect  EKG:  EKG is ordered today. Personal review of the ekg ordered shows sinus rhythm, rate 56, septal Q waves  Recent Labs: 12/25/2016: Magnesium 2.0; TSH 3.148 12/26/2016: Hemoglobin 12.0 01/15/2017: BUN 7; Creatinine, Ser 0.95; Platelets 660; Potassium 4.0; Sodium 142    Lipid Panel     Component Value Date/Time   CHOL 187 12/26/2016 0246   TRIG 52 12/26/2016 0246   HDL 51 12/26/2016 0246   CHOLHDL 3.7 12/26/2016 0246   VLDL 10 12/26/2016 0246   LDLCALC 126 (H) 12/26/2016 0246     Wt Readings from Last 3 Encounters:  02/23/17 119 lb 12.8 oz (54.3 kg)  01/23/17 115 lb 1.3 oz (52.2 kg)  01/04/17 115 lb (52.2 kg)      Other studies Reviewed: Additional studies/ records that were reviewed today include: TTE 12/26/16  Review of the above records today demonstrates:  - Left ventricle: LVEF is mildly depressed with hypokinesis of the   mid/distal anteriorand distal anterolateral walls The cavity size   was normal. Wall thickness was normal. Systolic function was   mildly reduced. The estimated ejection fraction is approxiamately   50%. Doppler parameters are consistent with abnormal left   ventricular relaxation (grade 1 diastolic dysfunction). - Mitral valve: There was mild regurgitation. - Right atrium: The atrium was mildly dilated. - Pulmonary arteries: PA peak pressure: 36 mm Hg (S).    ASSESSMENT AND PLAN:  1.  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 Had negative EP study 01/22/17. She was put on metoprolol after the procedure and her verapamil was stopped. She has had no further episodes of SVT.  2. Hyperlipidemia: Continue Lipitor  3. Elevated blood pressure: Unclear she actually does have hypertension. Check blood pressures prior to  therapy.   Current medicines are reviewed at length with the patient today.   The patient does not have concerns regarding her medicines.  The following changes were made today:  none  Labs/ tests ordered today include:  Orders Placed This Encounter  Procedures  . EKG 12-Lead     Disposition:   FU with Verl Kitson 1 year  Signed, Shaleena Crusoe Meredith Leeds, MD  02/23/2017 11:52 AM     [facility] [address] [place] [address] [phone] (office) [phone] (fax)

## 2017-02-23 NOTE — Patient Instructions (Signed)
Medication Instructions:  Your physician recommends that you continue on your current medications as directed. Please refer to the Current Medication list given to you today.  * If you need a refill on your cardiac medications before your next appointment, please call your pharmacy.   Labwork: None ordered  Testing/Procedures: None ordered  Follow-Up: Your physician wants you to follow-up in: 1 year with Dr. Camnitz.  You will receive a reminder letter in the mail two months in advance. If you don't receive a letter, please call our office to schedule the follow-up appointment.  Thank you for choosing [facility]!!   Sherri Price, RN [phone]        

## 2017-03-01 ENCOUNTER — Ambulatory Visit: Payer: BLUE CROSS/BLUE SHIELD | Admitting: Nurse Practitioner

## 2017-03-09 ENCOUNTER — Encounter: Payer: Self-pay | Admitting: Internal Medicine

## 2017-03-09 ENCOUNTER — Ambulatory Visit (INDEPENDENT_AMBULATORY_CARE_PROVIDER_SITE_OTHER): Payer: BLUE CROSS/BLUE SHIELD | Admitting: Internal Medicine

## 2017-03-09 VITALS — BP 146/88 | HR 55 | Temp 97.5°F | Resp 16 | Ht 65.0 in | Wt 121.0 lb

## 2017-03-09 DIAGNOSIS — Z0000 Encounter for general adult medical examination without abnormal findings: Secondary | ICD-10-CM | POA: Diagnosis not present

## 2017-03-09 DIAGNOSIS — R202 Paresthesia of skin: Secondary | ICD-10-CM | POA: Insufficient documentation

## 2017-03-09 DIAGNOSIS — F439 Reaction to severe stress, unspecified: Secondary | ICD-10-CM | POA: Insufficient documentation

## 2017-03-09 DIAGNOSIS — F4321 Adjustment disorder with depressed mood: Secondary | ICD-10-CM | POA: Insufficient documentation

## 2017-03-09 DIAGNOSIS — I471 Supraventricular tachycardia: Secondary | ICD-10-CM

## 2017-03-09 DIAGNOSIS — D352 Benign neoplasm of pituitary gland: Secondary | ICD-10-CM

## 2017-03-09 MED ORDER — ESCITALOPRAM OXALATE 5 MG PO TABS: 5.0000 mg | ORAL_TABLET | Freq: Every day | ORAL | 5 refills | Status: DC

## 2017-03-09 NOTE — Assessment & Plan Note (Signed)
R arm and hand ?etiology - long time Taking Lyrica prn Labs

## 2017-03-09 NOTE — Assessment & Plan Note (Signed)
S/p resection in 2007 - benign

## 2017-03-09 NOTE — Assessment & Plan Note (Signed)
We discussed age appropriate health related issues, including available/recomended screening tests and vaccinations. We discussed a need for adhering to healthy diet and exercise. Labs/EKG were reviewed/ordered. All questions were answered. Pt had a colon elsewhere 2-3 years ago - nl per pt Ophth exam - pending Needs a PAP Mammogram

## 2017-03-09 NOTE — Progress Notes (Signed)
Subjective:  Patient ID: Janet Sherman, female    DOB: [birth year]  Age: 66 y.o. MRN: [id number]  CC: Establish Care (HTN, stress, hormones,)   HPI Janet Sherman presents for a new pew pt well exam C/o elev BP C/o R arm numbness >10 years off and on treated w/Lyrica off and on  Outpatient Medications Prior to Visit  Medication Sig Dispense Refill  . aspirin 81 MG chewable tablet Chew 1 tablet (81 mg total) by mouth daily. (Patient taking differently: Chew 81 mg by mouth daily as needed for mild pain. ) 30 tablet 0  . ibuprofen (ADVIL,MOTRIN) 200 MG tablet Take 200 mg by mouth every 6 (six) hours as needed for moderate pain.    . metoprolol tartrate (LOPRESSOR) 25 MG tablet Take 1 tablet (25 mg total) by mouth 2 (two) times daily. 60 tablet 6  . Multiple Vitamin (MULTIVITAMIN WITH MINERALS) TABS tablet Take 1 tablet by mouth daily.    Marland Kitchen atorvastatin (LIPITOR) 20 MG tablet Take 1 tablet (20 mg total) by mouth daily. (Patient not taking: Reported on 03/09/2017) 30 tablet 0   No facility-administered medications prior to visit.     ROS Review of Systems  Constitutional: Negative for activity change, appetite change, chills, fatigue and unexpected weight change.  HENT: Negative for congestion, mouth sores and sinus pressure.   Eyes: Negative for visual disturbance.  Respiratory: Negative for cough and chest tightness.   Gastrointestinal: Negative for abdominal pain and nausea.  Genitourinary: Negative for difficulty urinating, frequency and vaginal pain.  Musculoskeletal: Negative for back pain and gait problem.  Skin: Negative for pallor and rash.  Neurological: Negative for dizziness, tremors, weakness, numbness and headaches.  Psychiatric/Behavioral: Negative for confusion, sleep disturbance and suicidal ideas. The patient is not nervous/anxious.     Objective:  BP (!) 146/88   Pulse (!) 55   Temp 97.5 F (36.4 C)   Resp 16   Ht 5\' 5"  (1.651 m)   Wt 121 lb (54.9 kg)   SpO2 97%    BMI 20.14 kg/m   BP Readings from Last 3 Encounters:  03/09/17 (!) 146/88  02/23/17 130/90  01/23/17 114/78    Wt Readings from Last 3 Encounters:  03/09/17 121 lb (54.9 kg)  02/23/17 119 lb 12.8 oz (54.3 kg)  01/23/17 115 lb 1.3 oz (52.2 kg)    Physical Exam  Constitutional: She appears well-developed. No distress.  HENT:  Head: Normocephalic.  Right Ear: External ear normal.  Left Ear: External ear normal.  Nose: Nose normal.  Mouth/Throat: Oropharynx is clear and moist.  Eyes: Conjunctivae are normal. Pupils are equal, round, and reactive to light. Right eye exhibits no discharge. Left eye exhibits no discharge.  Neck: Normal range of motion. Neck supple. No JVD present. No tracheal deviation present. No thyromegaly present.  Cardiovascular: Normal rate, regular rhythm and normal heart sounds.   Pulmonary/Chest: No stridor. No respiratory distress. She has no wheezes.  Abdominal: Soft. Bowel sounds are normal. She exhibits no distension and no mass. There is no tenderness. There is no rebound and no guarding.  Musculoskeletal: She exhibits no edema or tenderness.  Lymphadenopathy:    She has no cervical adenopathy.  Neurological: She displays normal reflexes. No cranial nerve deficit. She exhibits normal muscle tone. Coordination normal.  Skin: No rash noted. No erythema.  Psychiatric: She has a normal mood and affect. Her behavior is normal. Judgment and thought content normal.    Lab Results  Component Value Date  WBC 9.9 01/15/2017   HGB 12.0 12/26/2016   HCT 39.0 01/15/2017   PLT 660 (H) 01/15/2017   GLUCOSE 103 (H) 01/15/2017   CHOL 187 12/26/2016   TRIG 52 12/26/2016   HDL 51 12/26/2016   LDLCALC 126 (H) 12/26/2016   ALT 8 08/29/2009   AST 15 08/29/2009   NA 142 01/15/2017   K 4.0 01/15/2017   CL 100 01/15/2017   CREATININE 0.95 01/15/2017   BUN 7 (L) 01/15/2017   CO2 23 01/15/2017   TSH 3.148 12/25/2016   HGBA1C 5.3 12/26/2016   MICROALBUR 0.98  08/29/2009    No results found.  Assessment & Plan:   There are no diagnoses linked to this encounter. I am having Ms. Crilly maintain her aspirin, atorvastatin, multivitamin with minerals, ibuprofen, and metoprolol tartrate.  No orders of the defined types were placed in this encounter.    Follow-up: No Follow-up on file.  Walker Kehr, MD

## 2017-03-09 NOTE — Assessment & Plan Note (Signed)
Lexapro 5 mg qd

## 2017-03-09 NOTE — Patient Instructions (Signed)

## 2017-03-09 NOTE — Assessment & Plan Note (Signed)
S/p ablation Toprol XL Dr Curt Bears

## 2017-03-09 NOTE — Progress Notes (Signed)
Pre-visit discussion using our clinic review tool. No additional management support is needed unless otherwise documented below in the visit note.  

## 2017-03-10 ENCOUNTER — Other Ambulatory Visit: Payer: Self-pay | Admitting: Internal Medicine

## 2017-03-10 ENCOUNTER — Other Ambulatory Visit (INDEPENDENT_AMBULATORY_CARE_PROVIDER_SITE_OTHER): Payer: BLUE CROSS/BLUE SHIELD

## 2017-03-10 DIAGNOSIS — Z0000 Encounter for general adult medical examination without abnormal findings: Secondary | ICD-10-CM

## 2017-03-10 DIAGNOSIS — F439 Reaction to severe stress, unspecified: Secondary | ICD-10-CM

## 2017-03-10 DIAGNOSIS — I471 Supraventricular tachycardia: Secondary | ICD-10-CM

## 2017-03-10 DIAGNOSIS — R202 Paresthesia of skin: Secondary | ICD-10-CM

## 2017-03-10 LAB — URINALYSIS, ROUTINE W REFLEX MICROSCOPIC
BILIRUBIN URINE: NEGATIVE
HGB URINE DIPSTICK: NEGATIVE
Ketones, ur: NEGATIVE
NITRITE: NEGATIVE
RBC / HPF: NONE SEEN (ref 0–?)
Specific Gravity, Urine: <=1.005 — AB (ref 1.000–1.030)
Total Protein, Urine: NEGATIVE
Urine Glucose: NEGATIVE
Urobilinogen, UA: 0.2 (ref 0.0–1.0)
pH: 6.5 (ref 5.0–8.0)

## 2017-03-10 LAB — CBC WITH DIFFERENTIAL/PLATELET
BASOS PCT: 1.6 % (ref 0.0–3.0)
Basophils Absolute: 0.1 10*3/uL (ref 0.0–0.1)
EOS PCT: 3.6 % (ref 0.0–5.0)
Eosinophils Absolute: 0.3 10*3/uL (ref 0.0–0.7)
HCT: 40.5 % (ref 36.0–46.0)
Hemoglobin: 13.2 g/dL (ref 12.0–15.0)
LYMPHS ABS: 1.5 10*3/uL (ref 0.7–4.0)
Lymphocytes Relative: 16.6 % (ref 12.0–46.0)
MCHC: 32.7 g/dL (ref 30.0–36.0)
MCV: 77.8 fl — ABNORMAL LOW (ref 78.0–100.0)
MONO ABS: 0.4 10*3/uL (ref 0.1–1.0)
Monocytes Relative: 4.5 % (ref 3.0–12.0)
NEUTROS PCT: 73.7 % (ref 43.0–77.0)
Neutro Abs: 6.8 10*3/uL (ref 1.4–7.7)
Platelets: 689 10*3/uL — ABNORMAL HIGH (ref 150.0–400.0)
RBC: 5.21 Mil/uL — AB (ref 3.87–5.11)
RDW: 13.8 % (ref 11.5–15.5)
WBC: 9.2 10*3/uL (ref 4.0–10.5)

## 2017-03-10 LAB — LIPID PANEL
Cholesterol: 232 mg/dL — ABNORMAL HIGH (ref 0–200)
HDL: 53.9 mg/dL (ref >39.00)
LDL Cholesterol: 145 mg/dL — ABNORMAL HIGH (ref 0–99)
NonHDL: 178.28
TRIGLYCERIDES: 167 mg/dL — AB (ref 0.0–149.0)
Total CHOL/HDL Ratio: 4
VLDL: 33.4 mg/dL (ref 0.0–40.0)

## 2017-03-10 LAB — BASIC METABOLIC PANEL
BUN: 9 mg/dL (ref 6–23)
CALCIUM: 10.1 mg/dL (ref 8.4–10.5)
CO2: 27 mEq/L (ref 19–32)
Chloride: 104 mEq/L (ref 96–112)
Creatinine, Ser: 0.87 mg/dL (ref 0.40–1.20)
GFR: 84.06 mL/min (ref >60.00)
Glucose, Bld: 78 mg/dL (ref 70–99)
POTASSIUM: 4 meq/L (ref 3.5–5.1)
SODIUM: 142 meq/L (ref 135–145)

## 2017-03-10 LAB — TSH: TSH: 3.01 u[IU]/mL (ref 0.35–4.50)

## 2017-03-10 LAB — HEPATIC FUNCTION PANEL
ALK PHOS: 59 U/L (ref 39–117)
ALT: 11 U/L (ref 0–35)
AST: 15 U/L (ref 0–37)
Albumin: 4.1 g/dL (ref 3.5–5.2)
BILIRUBIN DIRECT: 0 mg/dL (ref 0.0–0.3)
BILIRUBIN TOTAL: 0.4 mg/dL (ref 0.2–1.2)
TOTAL PROTEIN: 7.4 g/dL (ref 6.0–8.3)

## 2017-03-10 LAB — VITAMIN B12: Vitamin B-12: 236 pg/mL (ref 211–911)

## 2017-03-10 LAB — VITAMIN D 25 HYDROXY (VIT D DEFICIENCY, FRACTURES): VITD: 26.85 ng/mL — ABNORMAL LOW (ref 30.00–100.00)

## 2017-03-10 MED ORDER — AMPICILLIN 500 MG PO CAPS: 500.0000 mg | ORAL_CAPSULE | Freq: Four times a day (QID) | ORAL | 0 refills | Status: DC

## 2017-03-10 MED ORDER — VITAMIN D3 50 MCG (2000 UT) PO CAPS: 2000.0000 [IU] | ORAL_CAPSULE | Freq: Every day | ORAL | 3 refills | Status: DC

## 2017-03-10 MED ORDER — VITAMIN B-12 1000 MCG SL SUBL: 1.0000 | SUBLINGUAL_TABLET | Freq: Every day | SUBLINGUAL | 3 refills | Status: DC

## 2017-03-10 MED ORDER — VITAMIN D3 1.25 MG (50000 UT) PO CAPS: 1.0000 | ORAL_CAPSULE | ORAL | 0 refills | Status: DC

## 2017-04-01 ENCOUNTER — Ambulatory Visit
Admission: RE | Admit: 2017-04-01 | Discharge: 2017-04-01 | Disposition: A | Discharge status: Home / Self Care | Payer: BLUE CROSS/BLUE SHIELD | Source: Ambulatory Visit | Attending: Internal Medicine | Admitting: Internal Medicine

## 2017-04-01 ENCOUNTER — Other Ambulatory Visit: Payer: Self-pay | Admitting: Internal Medicine

## 2017-04-01 DIAGNOSIS — Z1231 Encounter for screening mammogram for malignant neoplasm of breast: Secondary | ICD-10-CM

## 2017-04-01 DIAGNOSIS — Z0000 Encounter for general adult medical examination without abnormal findings: Secondary | ICD-10-CM

## 2017-05-10 DIAGNOSIS — H2513 Age-related nuclear cataract, bilateral: Secondary | ICD-10-CM | POA: Diagnosis not present

## 2017-05-10 DIAGNOSIS — H5203 Hypermetropia, bilateral: Secondary | ICD-10-CM | POA: Diagnosis not present

## 2017-06-01 ENCOUNTER — Other Ambulatory Visit: Payer: BLUE CROSS/BLUE SHIELD

## 2017-06-08 ENCOUNTER — Other Ambulatory Visit: Payer: BLUE CROSS/BLUE SHIELD

## 2017-06-09 ENCOUNTER — Ambulatory Visit (INDEPENDENT_AMBULATORY_CARE_PROVIDER_SITE_OTHER): Payer: Medicare Other | Admitting: Internal Medicine

## 2017-06-09 ENCOUNTER — Encounter: Payer: Self-pay | Admitting: Internal Medicine

## 2017-06-09 VITALS — BP 132/82 | HR 62 | Temp 98.3°F | Ht 65.0 in | Wt 120.0 lb

## 2017-06-09 DIAGNOSIS — Z23 Encounter for immunization: Secondary | ICD-10-CM

## 2017-06-09 DIAGNOSIS — N951 Menopausal and female climacteric states: Secondary | ICD-10-CM | POA: Insufficient documentation

## 2017-06-09 DIAGNOSIS — E785 Hyperlipidemia, unspecified: Secondary | ICD-10-CM | POA: Diagnosis not present

## 2017-06-09 DIAGNOSIS — I471 Supraventricular tachycardia: Secondary | ICD-10-CM | POA: Diagnosis not present

## 2017-06-09 DIAGNOSIS — J0100 Acute maxillary sinusitis, unspecified: Secondary | ICD-10-CM

## 2017-06-09 DIAGNOSIS — J019 Acute sinusitis, unspecified: Secondary | ICD-10-CM | POA: Insufficient documentation

## 2017-06-09 MED ORDER — CEFDINIR 300 MG PO CAPS: 300.0000 mg | ORAL_CAPSULE | Freq: Two times a day (BID) | ORAL | 0 refills | Status: DC

## 2017-06-09 MED ORDER — ZOSTER VAC RECOMB ADJUVANTED 50 MCG/0.5ML IM SUSR: 0.5000 mL | Freq: Once | INTRAMUSCULAR | 1 refills | Status: AC

## 2017-06-09 NOTE — Assessment & Plan Note (Signed)
On Lipitor 

## 2017-06-09 NOTE — Assessment & Plan Note (Signed)
Metoprolol 

## 2017-06-09 NOTE — Assessment & Plan Note (Signed)
On Lexapro 

## 2017-06-09 NOTE — Addendum Note (Signed)
Addended by: Karren Cobble on: 06/09/2017 03:47 PM   Modules accepted: Orders

## 2017-06-09 NOTE — Assessment & Plan Note (Signed)
Omnicef

## 2017-06-09 NOTE — Progress Notes (Signed)
Subjective:  Patient ID: Janet Sherman, female    DOB: [birth year]  Age: 66 y.o. MRN: [id number]  CC: No chief complaint on file.   HPI Janet Sherman presents for SVT, anxiety, hot flashes, dyslipidemia f/u. C/o sinus sinus congestion and green d/c  Outpatient Medications Prior to Visit  Medication Sig Dispense Refill  . ampicillin (PRINCIPEN) 500 MG capsule Take 1 capsule (500 mg total) by mouth 4 (four) times daily. 20 capsule 0  . aspirin 81 MG chewable tablet Chew 1 tablet (81 mg total) by mouth daily. (Patient taking differently: Chew 81 mg by mouth daily as needed for mild pain. ) 30 tablet 0  . atorvastatin (LIPITOR) 20 MG tablet Take 1 tablet (20 mg total) by mouth daily. 30 tablet 0  . Cholecalciferol (VITAMIN D3) 2000 units capsule Take 1 capsule (2,000 Units total) by mouth daily. 100 capsule 3  . Cholecalciferol (VITAMIN D3) 50000 units CAPS Take 1 capsule by mouth once a week. 6 capsule 0  . Cyanocobalamin (VITAMIN B-12) 1000 MCG SUBL Place 1 tablet (1,000 mcg total) under the tongue daily. 100 tablet 3  . escitalopram (LEXAPRO) 5 MG tablet Take 1 tablet (5 mg total) by mouth daily. 30 tablet 5  . ibuprofen (ADVIL,MOTRIN) 200 MG tablet Take 200 mg by mouth every 6 (six) hours as needed for moderate pain.    . metoprolol tartrate (LOPRESSOR) 25 MG tablet Take 1 tablet (25 mg total) by mouth 2 (two) times daily. 60 tablet 6  . Multiple Vitamin (MULTIVITAMIN WITH MINERALS) TABS tablet Take 1 tablet by mouth daily.     No facility-administered medications prior to visit.     ROS Review of Systems  Constitutional: Negative for activity change, appetite change, chills, fatigue and unexpected weight change.  HENT: Negative for congestion, mouth sores and sinus pressure.   Eyes: Negative for visual disturbance.  Respiratory: Negative for cough and chest tightness.   Gastrointestinal: Negative for abdominal pain and nausea.  Genitourinary: Negative for difficulty urinating,  frequency and vaginal pain.  Musculoskeletal: Negative for back pain and gait problem.  Skin: Negative for pallor and rash.  Neurological: Negative for dizziness, tremors, weakness, numbness and headaches.  Psychiatric/Behavioral: Negative for confusion and sleep disturbance.  sweats  Objective:  BP 132/82 (BP Location: Left Arm, Patient Position: Sitting, Cuff Size: Normal)   Pulse 62   Temp 98.3 F (36.8 C) (Oral)   Ht 5\' 5"  (1.651 m)   Wt 120 lb (54.4 kg)   SpO2 98%   BMI 19.97 kg/m   BP Readings from Last 3 Encounters:  06/09/17 132/82  03/09/17 (!) 146/88  02/23/17 130/90    Wt Readings from Last 3 Encounters:  06/09/17 120 lb (54.4 kg)  03/09/17 121 lb (54.9 kg)  02/23/17 119 lb 12.8 oz (54.3 kg)    Physical Exam  Constitutional: She appears well-developed. No distress.  HENT:  Head: Normocephalic.  Right Ear: External ear normal.  Left Ear: External ear normal.  Nose: Nose normal.  Mouth/Throat: Oropharynx is clear and moist.  Eyes: Conjunctivae are normal. Pupils are equal, round, and reactive to light. Right eye exhibits no discharge. Left eye exhibits no discharge.  Neck: Normal range of motion. Neck supple. No JVD present. No tracheal deviation present. No thyromegaly present.  Cardiovascular: Normal rate, regular rhythm and normal heart sounds.   Pulmonary/Chest: No stridor. No respiratory distress. She has no wheezes.  Abdominal: Soft. Bowel sounds are normal. She exhibits no distension and no mass. There  is no tenderness. There is no rebound and no guarding.  Musculoskeletal: She exhibits no edema or tenderness.  Lymphadenopathy:    She has no cervical adenopathy.  Neurological: She displays normal reflexes. No cranial nerve deficit. She exhibits normal muscle tone. Coordination normal.  Skin: No rash noted. No erythema.  Psychiatric: She has a normal mood and affect. Her behavior is normal. Judgment and thought content normal.  swollen nasal  mucosa  Lab Results  Component Value Date   WBC 9.2 03/10/2017   HGB 13.2 03/10/2017   HCT 40.5 03/10/2017   PLT 689.0 (H) 03/10/2017   GLUCOSE 78 03/10/2017   CHOL 232 (H) 03/10/2017   TRIG 167.0 (H) 03/10/2017   HDL 53.90 03/10/2017   LDLCALC 145 (H) 03/10/2017   ALT 11 03/10/2017   AST 15 03/10/2017   NA 142 03/10/2017   K 4.0 03/10/2017   CL 104 03/10/2017   CREATININE 0.87 03/10/2017   BUN 9 03/10/2017   CO2 27 03/10/2017   TSH 3.01 03/10/2017   HGBA1C 5.3 12/26/2016   MICROALBUR 0.98 08/29/2009    Mm Digital Screening Bilateral  Result Date: 04/01/2017 CLINICAL DATA:  Screening. EXAM: DIGITAL SCREENING BILATERAL MAMMOGRAM WITH CAD COMPARISON:  Previous exam(s). ACR Breast Density Category b: There are scattered areas of fibroglandular density. FINDINGS: There are no findings suspicious for malignancy. Images were processed with CAD. IMPRESSION: No mammographic evidence of malignancy. A result letter of this screening mammogram will be mailed directly to the patient. RECOMMENDATION: Screening mammogram in one year. (Code:SM-B-01Y) BI-RADS CATEGORY  1: Negative. Electronically Signed   By: Franki Cabot M.D.   On: 04/01/2017 15:23    Assessment & Plan:   There are no diagnoses linked to this encounter. I am having Ms. Mcnay maintain her aspirin, atorvastatin, multivitamin with minerals, ibuprofen, metoprolol tartrate, escitalopram, Vitamin D3, Vitamin D3, Vitamin B-12, and ampicillin.  No orders of the defined types were placed in this encounter.    Follow-up: No Follow-up on file.  Walker Kehr, MD

## 2017-06-09 NOTE — Patient Instructions (Signed)
MC well w/Jill 

## 2017-09-20 ENCOUNTER — Other Ambulatory Visit: Payer: Self-pay

## 2017-09-20 MED ORDER — POLYMYXIN B-TRIMETHOPRIM 10000-0.1 UNIT/ML-% OP SOLN: 2.0000 [drp] | Freq: Two times a day (BID) | OPHTHALMIC | 0 refills | Status: AC

## 2017-12-08 ENCOUNTER — Ambulatory Visit: Payer: Medicare Other | Admitting: Internal Medicine

## 2017-12-14 ENCOUNTER — Telehealth: Payer: Self-pay | Admitting: Internal Medicine

## 2017-12-14 NOTE — Telephone Encounter (Signed)
Spoke with Janet Sherman regarding the [facility] call she received  about her AWV. Informed Ms. Stiger that she is eligible for Welcome to Medicare because she turned 26 in April of 2018, and she has the full 12 months to complete her Welcome to [facility] Visit. Patient asked if she could received a call back closer to April 2019 to schedule her an appointment. SF

## 2017-12-31 ENCOUNTER — Other Ambulatory Visit: Payer: Medicare Other

## 2018-01-07 ENCOUNTER — Ambulatory Visit (INDEPENDENT_AMBULATORY_CARE_PROVIDER_SITE_OTHER): Payer: Medicare Other | Admitting: Internal Medicine

## 2018-01-07 ENCOUNTER — Encounter: Payer: Self-pay | Admitting: Internal Medicine

## 2018-01-07 VITALS — BP 136/82 | HR 56 | Temp 98.1°F | Ht 65.0 in | Wt 135.0 lb

## 2018-01-07 DIAGNOSIS — E538 Deficiency of other specified B group vitamins: Secondary | ICD-10-CM | POA: Insufficient documentation

## 2018-01-07 DIAGNOSIS — F439 Reaction to severe stress, unspecified: Secondary | ICD-10-CM

## 2018-01-07 DIAGNOSIS — J209 Acute bronchitis, unspecified: Secondary | ICD-10-CM | POA: Diagnosis not present

## 2018-01-07 DIAGNOSIS — F17200 Nicotine dependence, unspecified, uncomplicated: Secondary | ICD-10-CM | POA: Diagnosis not present

## 2018-01-07 DIAGNOSIS — E559 Vitamin D deficiency, unspecified: Secondary | ICD-10-CM | POA: Diagnosis not present

## 2018-01-07 DIAGNOSIS — Z23 Encounter for immunization: Secondary | ICD-10-CM

## 2018-01-07 DIAGNOSIS — E785 Hyperlipidemia, unspecified: Secondary | ICD-10-CM | POA: Diagnosis not present

## 2018-01-07 MED ORDER — PROMETHAZINE-CODEINE 6.25-10 MG/5ML PO SYRP: 5.0000 mL | ORAL_SOLUTION | ORAL | 0 refills | Status: DC | PRN

## 2018-01-07 MED ORDER — CEFDINIR 300 MG PO CAPS
300.0000 mg | ORAL_CAPSULE | Freq: Two times a day (BID) | ORAL | 1 refills | Status: DC
Start: 2018-01-07 — End: 2018-07-08

## 2018-01-07 MED ORDER — METOPROLOL TARTRATE 25 MG PO TABS: 25.0000 mg | ORAL_TABLET | Freq: Two times a day (BID) | ORAL | 3 refills | Status: DC

## 2018-01-07 MED ORDER — ESCITALOPRAM OXALATE 5 MG PO TABS: 5.0000 mg | ORAL_TABLET | Freq: Every day | ORAL | 3 refills | Status: DC

## 2018-01-07 MED ORDER — LORATADINE 10 MG PO TABS: 10.0000 mg | ORAL_TABLET | Freq: Every day | ORAL | 11 refills | Status: DC

## 2018-01-07 MED ORDER — ATORVASTATIN CALCIUM 20 MG PO TABS: 20.0000 mg | ORAL_TABLET | Freq: Every day | ORAL | 3 refills | Status: DC

## 2018-01-07 NOTE — Progress Notes (Signed)
Subjective:  Patient ID: Janet Sherman, female    DOB: June 20, 1951  Age: 67 y.o. MRN: [id number]  CC: No chief complaint on file.   HPI Janet Sherman presents for cough x 1 week; green mucus F/u stress, dyslipidemia  Outpatient Medications Prior to Visit  Medication Sig Dispense Refill  . aspirin 81 MG chewable tablet Chew 1 tablet (81 mg total) by mouth daily. (Patient taking differently: Chew 81 mg by mouth daily as needed for mild pain. ) 30 tablet 0  . atorvastatin (LIPITOR) 20 MG tablet Take 1 tablet (20 mg total) by mouth daily. 30 tablet 0  . Cholecalciferol (VITAMIN D3) 2000 units capsule Take 1 capsule (2,000 Units total) by mouth daily. 100 capsule 3  . Cyanocobalamin (VITAMIN B-12) 1000 MCG SUBL Place 1 tablet (1,000 mcg total) under the tongue daily. 100 tablet 3  . escitalopram (LEXAPRO) 5 MG tablet Take 1 tablet (5 mg total) by mouth daily. 30 tablet 5  . ibuprofen (ADVIL,MOTRIN) 200 MG tablet Take 200 mg by mouth every 6 (six) hours as needed for moderate pain.    . metoprolol tartrate (LOPRESSOR) 25 MG tablet Take 1 tablet (25 mg total) by mouth 2 (two) times daily. 60 tablet 6  . Multiple Vitamin (MULTIVITAMIN WITH MINERALS) TABS tablet Take 1 tablet by mouth daily.    . cefdinir (OMNICEF) 300 MG capsule Take 1 capsule (300 mg total) by mouth 2 (two) times daily. 20 capsule 0   No facility-administered medications prior to visit.     ROS Review of Systems  Constitutional: Positive for chills. Negative for activity change, appetite change, fatigue and unexpected weight change.  HENT: Positive for rhinorrhea and sinus pain. Negative for congestion, mouth sores and sinus pressure.   Eyes: Negative for visual disturbance.  Respiratory: Positive for cough and wheezing. Negative for chest tightness.   Gastrointestinal: Negative for abdominal pain and nausea.  Genitourinary: Negative for difficulty urinating, frequency and vaginal pain.  Musculoskeletal: Negative for back  pain and gait problem.  Skin: Negative for pallor and rash.  Neurological: Negative for dizziness, tremors, weakness, numbness and headaches.  Psychiatric/Behavioral: Positive for dysphoric mood. Negative for confusion, sleep disturbance and suicidal ideas. The patient is nervous/anxious.     Objective:  BP 136/82 (BP Location: Right Arm, Patient Position: Sitting, Cuff Size: Normal)   Pulse (!) 56   Temp 98.1 F (36.7 C) (Oral)   Ht 5\' 5"  (1.651 m)   Wt 135 lb (61.2 kg)   SpO2 97%   BMI 22.47 kg/m   BP Readings from Last 3 Encounters:  01/07/18 136/82  06/09/17 132/82  03/09/17 (!) 146/88    Wt Readings from Last 3 Encounters:  01/07/18 135 lb (61.2 kg)  06/09/17 120 lb (54.4 kg)  03/09/17 121 lb (54.9 kg)    Physical Exam  Constitutional: She appears well-developed. No distress.  HENT:  Head: Normocephalic.  Right Ear: External ear normal.  Left Ear: External ear normal.  Nose: Nose normal.  Mouth/Throat: Oropharynx is clear and moist.  Eyes: Conjunctivae are normal. Pupils are equal, round, and reactive to light. Right eye exhibits no discharge. Left eye exhibits no discharge.  Neck: Normal range of motion. Neck supple. No JVD present. No tracheal deviation present. No thyromegaly present.  Cardiovascular: Normal rate, regular rhythm and normal heart sounds.  Pulmonary/Chest: No stridor. No respiratory distress. She has no wheezes. She has no rales.  Abdominal: Soft. Bowel sounds are normal. She exhibits no distension and no mass.  There is no tenderness. There is no rebound and no guarding.  Musculoskeletal: She exhibits no edema or tenderness.  Lymphadenopathy:    She has no cervical adenopathy.  Neurological: She displays normal reflexes. No cranial nerve deficit. She exhibits normal muscle tone. Coordination normal.  Skin: No rash noted. No erythema.  Psychiatric: She has a normal mood and affect. Her behavior is normal. Judgment and thought content normal.    [facility] Lab Results  Component Value Date   WBC 9.2 03/10/2017   HGB 13.2 03/10/2017   HCT 40.5 03/10/2017   PLT 689.0 (H) 03/10/2017   GLUCOSE 78 03/10/2017   CHOL 232 (H) 03/10/2017   TRIG 167.0 (H) 03/10/2017   HDL 53.90 03/10/2017   LDLCALC 145 (H) 03/10/2017   ALT 11 03/10/2017   AST 15 03/10/2017   NA 142 03/10/2017   K 4.0 03/10/2017   CL 104 03/10/2017   CREATININE 0.87 03/10/2017   BUN 9 03/10/2017   CO2 27 03/10/2017   TSH 3.01 03/10/2017   HGBA1C 5.3 12/26/2016   MICROALBUR 0.98 08/29/2009    Mm Digital Screening Bilateral  Result Date: 04/01/2017 CLINICAL DATA:  Screening. EXAM: DIGITAL SCREENING BILATERAL MAMMOGRAM WITH CAD COMPARISON:  Previous exam(s). ACR Breast Density Category b: There are scattered areas of fibroglandular density. FINDINGS: There are no findings suspicious for malignancy. Images were processed with CAD. IMPRESSION: No mammographic evidence of malignancy. A result letter of this screening mammogram will be mailed directly to the patient. RECOMMENDATION: Screening mammogram in one year. (Code:SM-B-01Y) BI-RADS CATEGORY  1: Negative. Electronically Signed   By: Franki Cabot M.D.   On: 04/01/2017 15:23    Assessment & Plan:   There are no diagnoses linked to this encounter. I have discontinued Janet Sherman's cefdinir. I am also having her maintain her aspirin, atorvastatin, multivitamin with minerals, ibuprofen, metoprolol tartrate, escitalopram, Vitamin D3, and Vitamin B-12.  No orders of the defined types were placed in this encounter.    Follow-up: No Follow-up on file.  Walker Kehr, MD

## 2018-01-07 NOTE — Assessment & Plan Note (Signed)
Lexapro 5 mg a day to re-start

## 2018-01-07 NOTE — Assessment & Plan Note (Signed)
On B12 

## 2018-01-07 NOTE — Assessment & Plan Note (Signed)
Quit in 2017 

## 2018-01-07 NOTE — Assessment & Plan Note (Signed)
On Vit D 

## 2018-01-07 NOTE — Assessment & Plan Note (Signed)
Lipitor 

## 2018-01-07 NOTE — Assessment & Plan Note (Signed)
Cefdinir Prom-cod syr

## 2018-01-07 NOTE — Patient Instructions (Addendum)
Please get your second Shingrix shot at your pharmacy     You can use over-the-counter  "cold" medicines  such as "Tylenol cold" , "Advil cold",  "Mucinex" or" Mucinex D"  for cough and congestion.   Avoid decongestants if you have high blood pressure and use "Afrin" nasal spray for nasal congestion as directed. Use " Delsym" or" Robitussin" cough syrup varietis for cough.  You can use plain "Tylenol" or "Advil" for fever, chills and achyness. Use Halls or Ricola cough drops.   Please, make an appointment if you are not better or if you're worse.

## 2018-02-23 ENCOUNTER — Other Ambulatory Visit: Payer: Self-pay | Admitting: Internal Medicine

## 2018-02-23 DIAGNOSIS — Z1231 Encounter for screening mammogram for malignant neoplasm of breast: Secondary | ICD-10-CM

## 2018-04-04 ENCOUNTER — Ambulatory Visit
Admission: RE | Admit: 2018-04-04 | Discharge: 2018-04-04 | Disposition: A | Discharge status: Home / Self Care | Payer: Medicare Other | Source: Ambulatory Visit | Attending: Internal Medicine | Admitting: Internal Medicine

## 2018-04-04 DIAGNOSIS — Z1231 Encounter for screening mammogram for malignant neoplasm of breast: Secondary | ICD-10-CM

## 2018-05-24 DIAGNOSIS — H5203 Hypermetropia, bilateral: Secondary | ICD-10-CM | POA: Diagnosis not present

## 2018-05-24 DIAGNOSIS — H11003 Unspecified pterygium of eye, bilateral: Secondary | ICD-10-CM | POA: Diagnosis not present

## 2018-05-24 DIAGNOSIS — H2513 Age-related nuclear cataract, bilateral: Secondary | ICD-10-CM | POA: Diagnosis not present

## 2018-07-08 ENCOUNTER — Ambulatory Visit (INDEPENDENT_AMBULATORY_CARE_PROVIDER_SITE_OTHER): Payer: Medicare Other | Admitting: Internal Medicine

## 2018-07-08 ENCOUNTER — Encounter: Payer: Self-pay | Admitting: Internal Medicine

## 2018-07-08 VITALS — BP 132/80 | HR 48 | Temp 97.5°F | Ht 65.0 in | Wt 131.0 lb

## 2018-07-08 DIAGNOSIS — E785 Hyperlipidemia, unspecified: Secondary | ICD-10-CM

## 2018-07-08 DIAGNOSIS — E538 Deficiency of other specified B group vitamins: Secondary | ICD-10-CM

## 2018-07-08 DIAGNOSIS — Z0000 Encounter for general adult medical examination without abnormal findings: Secondary | ICD-10-CM

## 2018-07-08 DIAGNOSIS — Z1211 Encounter for screening for malignant neoplasm of colon: Secondary | ICD-10-CM

## 2018-07-08 DIAGNOSIS — Z23 Encounter for immunization: Secondary | ICD-10-CM

## 2018-07-08 DIAGNOSIS — E559 Vitamin D deficiency, unspecified: Secondary | ICD-10-CM

## 2018-07-08 MED ORDER — METOPROLOL TARTRATE 25 MG PO TABS: 25.0000 mg | ORAL_TABLET | Freq: Two times a day (BID) | ORAL | 3 refills | Status: DC

## 2018-07-08 MED ORDER — ESCITALOPRAM OXALATE 5 MG PO TABS: 5.0000 mg | ORAL_TABLET | Freq: Every day | ORAL | 3 refills | Status: DC

## 2018-07-08 MED ORDER — FLUTICASONE PROPIONATE 50 MCG/ACT NA SUSP: 2.0000 | Freq: Every day | NASAL | 6 refills | Status: DC

## 2018-07-08 MED ORDER — ATORVASTATIN CALCIUM 20 MG PO TABS: 20.0000 mg | ORAL_TABLET | Freq: Every day | ORAL | 3 refills | Status: DC

## 2018-07-08 NOTE — Assessment & Plan Note (Signed)
Vit d 

## 2018-07-08 NOTE — Assessment & Plan Note (Signed)
On B12 

## 2018-07-08 NOTE — Assessment & Plan Note (Signed)

## 2018-07-08 NOTE — Assessment & Plan Note (Signed)
Labs

## 2018-07-08 NOTE — Patient Instructions (Signed)
Health Maintenance for Postmenopausal Women Menopause is a normal process in which your reproductive ability comes to an end. This process happens gradually over a span of months to years, usually between the ages of 22 and 9. Menopause is complete when you have missed 12 consecutive menstrual periods. It is important to talk with your health care provider about some of the most common conditions that affect postmenopausal women, such as heart disease, cancer, and bone loss (osteoporosis). Adopting a healthy lifestyle and getting preventive care can help to promote your health and wellness. Those actions can also lower your chances of developing some of these common conditions. What should I know about menopause? During menopause, you may experience a number of symptoms, such as:  Moderate-to-severe hot flashes.  Night sweats.  Decrease in sex drive.  Mood swings.  Headaches.  Tiredness.  Irritability.  Memory problems.  Insomnia.  Choosing to treat or not to treat menopausal changes is an individual decision that you make with your health care provider. What should I know about hormone replacement therapy and supplements? Hormone therapy products are effective for treating symptoms that are associated with menopause, such as hot flashes and night sweats. Hormone replacement carries certain risks, especially as you become older. If you are thinking about using estrogen or estrogen with progestin treatments, discuss the benefits and risks with your health care provider. What should I know about heart disease and stroke? Heart disease, heart attack, and stroke become more likely as you age. This may be due, in part, to the hormonal changes that your body experiences during menopause. These can affect how your body processes dietary fats, triglycerides, and cholesterol. Heart attack and stroke are both medical emergencies. There are many things that you can do to help prevent heart disease  and stroke:  Have your blood pressure checked at least every 1-2 years. High blood pressure causes heart disease and increases the risk of stroke.  If you are 53-22 years old, ask your health care provider if you should take aspirin to prevent a heart attack or a stroke.  Do not use any tobacco products, including cigarettes, chewing tobacco, or electronic cigarettes. If you need help quitting, ask your health care provider.  It is important to eat a healthy diet and maintain a healthy weight. ? Be sure to include plenty of vegetables, fruits, low-fat dairy products, and lean protein. ? Avoid eating foods that are high in solid fats, added sugars, or salt (sodium).  Get regular exercise. This is one of the most important things that you can do for your health. ? Try to exercise for at least 150 minutes each week. The type of exercise that you do should increase your heart rate and make you sweat. This is known as moderate-intensity exercise. ? Try to do strengthening exercises at least twice each week. Do these in addition to the moderate-intensity exercise.  Know your numbers.Ask your health care provider to check your cholesterol and your blood glucose. Continue to have your blood tested as directed by your health care provider.  What should I know about cancer screening? There are several types of cancer. Take the following steps to reduce your risk and to catch any cancer development as early as possible. Breast Cancer  Practice breast self-awareness. ? This means understanding how your breasts normally appear and feel. ? It also means doing regular breast self-exams. Let your health care provider know about any changes, no matter how small.  If you are 40  or older, have a clinician do a breast exam (clinical breast exam or CBE) every year. Depending on your age, family history, and medical history, it may be recommended that you also have a yearly breast X-ray (mammogram).  If you  have a family history of breast cancer, talk with your health care provider about genetic screening.  If you are at high risk for breast cancer, talk with your health care provider about having an MRI and a mammogram every year.  Breast cancer (BRCA) gene test is recommended for women who have family members with BRCA-related cancers. Results of the assessment will determine the need for genetic counseling and BRCA1 and for BRCA2 testing. BRCA-related cancers include these types: ? Breast. This occurs in males or females. ? Ovarian. ? Tubal. This may also be called fallopian tube cancer. ? Cancer of the abdominal or pelvic lining (peritoneal cancer). ? Prostate. ? Pancreatic.  Cervical, Uterine, and Ovarian Cancer Your health care provider may recommend that you be screened regularly for cancer of the pelvic organs. These include your ovaries, uterus, and vagina. This screening involves a pelvic exam, which includes checking for microscopic changes to the surface of your cervix (Pap test).  For women ages 21-65, health care providers may recommend a pelvic exam and a Pap test every three years. For women ages 79-65, they may recommend the Pap test and pelvic exam, combined with testing for human papilloma virus (HPV), every five years. Some types of HPV increase your risk of cervical cancer. Testing for HPV may also be done on women of any age who have unclear Pap test results.  Other health care providers may not recommend any screening for nonpregnant women who are considered low risk for pelvic cancer and have no symptoms. Ask your health care provider if a screening pelvic exam is right for you.  If you have had past treatment for cervical cancer or a condition that could lead to cancer, you need Pap tests and screening for cancer for at least 20 years after your treatment. If Pap tests have been discontinued for you, your risk factors (such as having a new sexual partner) need to be  reassessed to determine if you should start having screenings again. Some women have medical problems that increase the chance of getting cervical cancer. In these cases, your health care provider may recommend that you have screening and Pap tests more often.  If you have a family history of uterine cancer or ovarian cancer, talk with your health care provider about genetic screening.  If you have vaginal bleeding after reaching menopause, tell your health care provider.  There are currently no reliable tests available to screen for ovarian cancer.  Lung Cancer Lung cancer screening is recommended for adults 69-62 years old who are at high risk for lung cancer because of a history of smoking. A yearly low-dose CT scan of the lungs is recommended if you:  Currently smoke.  Have a history of at least 30 pack-years of smoking and you currently smoke or have quit within the past 15 years. A pack-year is smoking an average of one pack of cigarettes per day for one year.  Yearly screening should:  Continue until it has been 15 years since you quit.  Stop if you develop a health problem that would prevent you from having lung cancer treatment.  Colorectal Cancer  This type of cancer can be detected and can often be prevented.  Routine colorectal cancer screening usually begins at  age 42 and continues through age 45.  If you have risk factors for colon cancer, your health care provider may recommend that you be screened at an earlier age.  If you have a family history of colorectal cancer, talk with your health care provider about genetic screening.  Your health care provider may also recommend using home test kits to check for hidden blood in your stool.  A small camera at the end of a tube can be used to examine your colon directly (sigmoidoscopy or colonoscopy). This is done to check for the earliest forms of colorectal cancer.  Direct examination of the colon should be repeated every  5-10 years until age 71. However, if early forms of precancerous polyps or small growths are found or if you have a family history or genetic risk for colorectal cancer, you may need to be screened more often.  Skin Cancer  Check your skin from head to toe regularly.  Monitor any moles. Be sure to tell your health care provider: ? About any new moles or changes in moles, especially if there is a change in a mole's shape or color. ? If you have a mole that is larger than the size of a pencil eraser.  If any of your family members has a history of skin cancer, especially at a young age, talk with your health care provider about genetic screening.  Always use sunscreen. Apply sunscreen liberally and repeatedly throughout the day.  Whenever you are outside, protect yourself by wearing long sleeves, pants, a wide-brimmed hat, and sunglasses.  What should I know about osteoporosis? Osteoporosis is a condition in which bone destruction happens more quickly than new bone creation. After menopause, you may be at an increased risk for osteoporosis. To help prevent osteoporosis or the bone fractures that can happen because of osteoporosis, the following is recommended:  If you are 46-71 years old, get at least 1,000 mg of calcium and at least 600 mg of vitamin D per day.  If you are older than age 55 but younger than age 65, get at least 1,200 mg of calcium and at least 600 mg of vitamin D per day.  If you are older than age 54, get at least 1,200 mg of calcium and at least 800 mg of vitamin D per day.  Smoking and excessive alcohol intake increase the risk of osteoporosis. Eat foods that are rich in calcium and vitamin D, and do weight-bearing exercises several times each week as directed by your health care provider. What should I know about how menopause affects my mental health? Depression may occur at any age, but it is more common as you become older. Common symptoms of depression  include:  Low or sad mood.  Changes in sleep patterns.  Changes in appetite or eating patterns.  Feeling an overall lack of motivation or enjoyment of activities that you previously enjoyed.  Frequent crying spells.  Talk with your health care provider if you think that you are experiencing depression. What should I know about immunizations? It is important that you get and maintain your immunizations. These include:  Tetanus, diphtheria, and pertussis (Tdap) booster vaccine.  Influenza every year before the flu season begins.  Pneumonia vaccine.  Shingles vaccine.  Your health care provider may also recommend other immunizations. This information is not intended to replace advice given to you by your health care provider. Make sure you discuss any questions you have with your health care provider. Document Released: 02/05/2006  Document Revised: 07/03/2016 Document Reviewed: 09/17/2015 Elsevier Interactive Patient Education  2018 [employer]  

## 2018-07-08 NOTE — Progress Notes (Signed)
Subjective:  Patient ID: Janet Sherman, female    DOB: 12-15-1951  Age: 67 y.o. MRN: [id number]  CC: No chief complaint on file.   HPI Janet Sherman presents for a well exam  Outpatient Medications Prior to Visit  Medication Sig Dispense Refill  . aspirin 81 MG chewable tablet Chew 1 tablet (81 mg total) by mouth daily. (Patient taking differently: Chew 81 mg by mouth daily as needed for mild pain. ) 30 tablet 0  . atorvastatin (LIPITOR) 20 MG tablet Take 1 tablet (20 mg total) by mouth daily. 90 tablet 3  . cefdinir (OMNICEF) 300 MG capsule Take 1 capsule (300 mg total) by mouth 2 (two) times daily. 14 capsule 1  . Cholecalciferol (VITAMIN D3) 2000 units capsule Take 1 capsule (2,000 Units total) by mouth daily. 100 capsule 3  . Cyanocobalamin (VITAMIN B-12) 1000 MCG SUBL Place 1 tablet (1,000 mcg total) under the tongue daily. 100 tablet 3  . escitalopram (LEXAPRO) 5 MG tablet Take 1 tablet (5 mg total) by mouth daily. 90 tablet 3  . ibuprofen (ADVIL,MOTRIN) 200 MG tablet Take 200 mg by mouth every 6 (six) hours as needed for moderate pain.    Marland Kitchen loratadine (CLARITIN) 10 MG tablet Take 1 tablet (10 mg total) by mouth daily. 30 tablet 11  . metoprolol tartrate (LOPRESSOR) 25 MG tablet Take 1 tablet (25 mg total) by mouth 2 (two) times daily. 180 tablet 3  . promethazine-codeine (PHENERGAN WITH CODEINE) 6.25-10 MG/5ML syrup Take 5 mLs by mouth every 4 (four) hours as needed. 300 mL 0   No facility-administered medications prior to visit.     ROS: Review of Systems  Constitutional: Negative for activity change, appetite change, chills, fatigue and unexpected weight change.  HENT: Positive for congestion, postnasal drip and rhinorrhea. Negative for mouth sores and sinus pressure.   Eyes: Negative for visual disturbance.  Respiratory: Negative for cough and chest tightness.   Gastrointestinal: Negative for abdominal pain and nausea.  Genitourinary: Negative for difficulty urinating,  frequency and vaginal pain.  Musculoskeletal: Negative for back pain and gait problem.  Skin: Negative for pallor and rash.  Neurological: Negative for dizziness, tremors, weakness, numbness and headaches.  Psychiatric/Behavioral: Negative for confusion and sleep disturbance.    Objective:  BP 132/80 (BP Location: Left Arm, Patient Position: Sitting, Cuff Size: Normal)   Pulse (!) 48   Temp (!) 97.5 F (36.4 C) (Oral)   Ht 5\' 5"  (1.651 m)   Wt 131 lb (59.4 kg)   SpO2 98%   BMI 21.80 kg/m   BP Readings from Last 3 Encounters:  07/08/18 132/80  01/07/18 136/82  06/09/17 132/82    Wt Readings from Last 3 Encounters:  07/08/18 131 lb (59.4 kg)  01/07/18 135 lb (61.2 kg)  06/09/17 120 lb (54.4 kg)    Physical Exam  Constitutional: She appears well-developed. No distress.  HENT:  Head: Normocephalic.  Right Ear: External ear normal.  Left Ear: External ear normal.  Nose: Nose normal.  Mouth/Throat: Oropharynx is clear and moist.  Eyes: Pupils are equal, round, and reactive to light. Conjunctivae are normal. Right eye exhibits no discharge. Left eye exhibits no discharge.  Neck: Normal range of motion. Neck supple. No JVD present. No tracheal deviation present. No thyromegaly present.  Cardiovascular: Normal rate, regular rhythm and normal heart sounds.  Pulmonary/Chest: No stridor. No respiratory distress. She has no wheezes.  Abdominal: Soft. Bowel sounds are normal. She exhibits no distension and no mass. There  is no tenderness. There is no rebound and no guarding.  Musculoskeletal: She exhibits no edema or tenderness.  Lymphadenopathy:    She has no cervical adenopathy.  Neurological: She displays normal reflexes. No cranial nerve deficit. She exhibits normal muscle tone. Coordination normal.  Skin: No rash noted. No erythema.  Psychiatric: She has a normal mood and affect. Her behavior is normal. Judgment and thought content normal.    Lab Results  Component Value  Date   WBC 9.2 03/10/2017   HGB 13.2 03/10/2017   HCT 40.5 03/10/2017   PLT 689.0 (H) 03/10/2017   GLUCOSE 78 03/10/2017   CHOL 232 (H) 03/10/2017   TRIG 167.0 (H) 03/10/2017   HDL 53.90 03/10/2017   LDLCALC 145 (H) 03/10/2017   ALT 11 03/10/2017   AST 15 03/10/2017   NA 142 03/10/2017   K 4.0 03/10/2017   CL 104 03/10/2017   CREATININE 0.87 03/10/2017   BUN 9 03/10/2017   CO2 27 03/10/2017   TSH 3.01 03/10/2017   HGBA1C 5.3 12/26/2016   MICROALBUR 0.98 08/29/2009    Mm Screening Breast Tomo Bilateral  Result Date: 04/04/2018 CLINICAL DATA:  Screening. EXAM: DIGITAL SCREENING BILATERAL MAMMOGRAM WITH TOMO AND CAD COMPARISON:  Previous exam(s). ACR Breast Density Category c: The breast tissue is heterogeneously dense, which may obscure small masses. FINDINGS: There are no findings suspicious for malignancy. Images were processed with CAD. IMPRESSION: No mammographic evidence of malignancy. A result letter of this screening mammogram will be mailed directly to the patient. RECOMMENDATION: Screening mammogram in one year. (Code:SM-B-01Y) BI-RADS CATEGORY  1: Negative. Electronically Signed   By: Nolon Nations M.D.   On: 04/04/2018 16:31    Assessment & Plan:   Diagnoses and all orders for this visit:  Screening for colon cancer     No orders of the defined types were placed in this encounter.    Follow-up: No follow-ups on file.  Walker Kehr, MD

## 2018-07-08 NOTE — Addendum Note (Signed)
Addended by: Karren Cobble on: 07/08/2018 03:53 PM   Modules accepted: Orders

## 2018-08-31 ENCOUNTER — Other Ambulatory Visit (INDEPENDENT_AMBULATORY_CARE_PROVIDER_SITE_OTHER): Payer: Medicare Other

## 2018-08-31 DIAGNOSIS — E559 Vitamin D deficiency, unspecified: Secondary | ICD-10-CM

## 2018-08-31 DIAGNOSIS — E785 Hyperlipidemia, unspecified: Secondary | ICD-10-CM | POA: Diagnosis not present

## 2018-08-31 DIAGNOSIS — Z0000 Encounter for general adult medical examination without abnormal findings: Secondary | ICD-10-CM | POA: Diagnosis not present

## 2018-08-31 DIAGNOSIS — E538 Deficiency of other specified B group vitamins: Secondary | ICD-10-CM

## 2018-08-31 LAB — CBC WITH DIFFERENTIAL/PLATELET
BASOS ABS: 0.1 10*3/uL (ref 0.0–0.1)
Basophils Relative: 0.8 % (ref 0.0–3.0)
EOS PCT: 3.3 % (ref 0.0–5.0)
Eosinophils Absolute: 0.3 10*3/uL (ref 0.0–0.7)
HCT: 38.7 % (ref 36.0–46.0)
HEMOGLOBIN: 13 g/dL (ref 12.0–15.0)
Lymphocytes Relative: 19.3 % (ref 12.0–46.0)
Lymphs Abs: 1.7 10*3/uL (ref 0.7–4.0)
MCHC: 33.5 g/dL (ref 30.0–36.0)
MCV: 78.2 fl (ref 78.0–100.0)
MONO ABS: 0.5 10*3/uL (ref 0.1–1.0)
Monocytes Relative: 6 % (ref 3.0–12.0)
Neutro Abs: 6.3 10*3/uL (ref 1.4–7.7)
Neutrophils Relative %: 70.6 % (ref 43.0–77.0)
Platelets: 685 10*3/uL — ABNORMAL HIGH (ref 150.0–400.0)
RBC: 4.95 Mil/uL (ref 3.87–5.11)
RDW: 14.1 % (ref 11.5–15.5)
WBC: 8.9 10*3/uL (ref 4.0–10.5)

## 2018-08-31 LAB — URINALYSIS
Bilirubin Urine: NEGATIVE
KETONES UR: NEGATIVE
LEUKOCYTES UA: NEGATIVE
Nitrite: NEGATIVE
SPECIFIC GRAVITY, URINE: 1.015 (ref 1.000–1.030)
Total Protein, Urine: NEGATIVE
UROBILINOGEN UA: 0.2 (ref 0.0–1.0)
Urine Glucose: NEGATIVE
pH: 6 (ref 5.0–8.0)

## 2018-08-31 LAB — TSH: TSH: 1.84 u[IU]/mL (ref 0.35–4.50)

## 2018-08-31 LAB — BASIC METABOLIC PANEL
BUN: 13 mg/dL (ref 6–23)
CALCIUM: 9.6 mg/dL (ref 8.4–10.5)
CO2: 29 mEq/L (ref 19–32)
Chloride: 104 mEq/L (ref 96–112)
Creatinine, Ser: 0.89 mg/dL (ref 0.40–1.20)
GFR: 81.52 mL/min (ref >60.00)
GLUCOSE: 115 mg/dL — AB (ref 70–99)
Potassium: 3.7 mEq/L (ref 3.5–5.1)
SODIUM: 140 meq/L (ref 135–145)

## 2018-08-31 LAB — HEPATIC FUNCTION PANEL
ALT: 9 U/L (ref 0–35)
AST: 17 U/L (ref 0–37)
Albumin: 4.3 g/dL (ref 3.5–5.2)
Alkaline Phosphatase: 44 U/L (ref 39–117)
BILIRUBIN DIRECT: 0.1 mg/dL (ref 0.0–0.3)
BILIRUBIN TOTAL: 0.5 mg/dL (ref 0.2–1.2)
Total Protein: 7.2 g/dL (ref 6.0–8.3)

## 2018-08-31 LAB — LIPID PANEL
CHOL/HDL RATIO: 3
Cholesterol: 144 mg/dL (ref 0–200)
HDL: 46.5 mg/dL (ref >39.00)
LDL Cholesterol: 80 mg/dL (ref 0–99)
NonHDL: 97.93
TRIGLYCERIDES: 91 mg/dL (ref 0.0–149.0)
VLDL: 18.2 mg/dL (ref 0.0–40.0)

## 2018-08-31 LAB — VITAMIN D 25 HYDROXY (VIT D DEFICIENCY, FRACTURES): VITD: 36.91 ng/mL (ref 30.00–100.00)

## 2018-08-31 LAB — COLOGUARD: COLOGUARD: NEGATIVE

## 2018-08-31 LAB — VITAMIN B12: VITAMIN B 12: 1272 pg/mL — AB (ref 211–911)

## 2018-09-09 ENCOUNTER — Encounter: Payer: Self-pay | Admitting: Internal Medicine

## 2018-10-12 DIAGNOSIS — Z1212 Encounter for screening for malignant neoplasm of rectum: Secondary | ICD-10-CM | POA: Diagnosis not present

## 2018-10-12 DIAGNOSIS — Z1211 Encounter for screening for malignant neoplasm of colon: Secondary | ICD-10-CM | POA: Diagnosis not present

## 2019-01-05 ENCOUNTER — Telehealth: Payer: Self-pay

## 2019-01-05 NOTE — Telephone Encounter (Signed)
Please advise 

## 2019-01-05 NOTE — Telephone Encounter (Signed)
Spoke with patient's daughter today and she states she would like to have something called in for her mom for sinus infection. Patient reports symptoms of sinus headache, slight nose bleeds. Please advise patient if she needs to come in and be seen for further treatment or if something can be called in for her.

## 2019-01-06 NOTE — Telephone Encounter (Signed)
She can use over-the-counter  "cold" medicines  such as "Afrin" nasal spray for nasal congestion as directed. Use " Delsym" or" Robitussin" cough syrup varietis for cough.  You can use plain "Tylenol" or "Advil" for fever, chills and achyness. Use Halls or Ricola cough drops.   "Common cold" symptoms are usually triggered by a virus.  The antibiotics are usually not necessary. On average, a" viral cold" illness would take 4-7 days to resolve.  We can make an appointment for tomorrow w/me  Thx

## 2019-01-06 NOTE — Telephone Encounter (Signed)
Pt notified and will hold off on appointment

## 2019-02-28 ENCOUNTER — Other Ambulatory Visit: Payer: Self-pay | Admitting: Internal Medicine

## 2019-02-28 DIAGNOSIS — Z1231 Encounter for screening mammogram for malignant neoplasm of breast: Secondary | ICD-10-CM

## 2019-04-07 ENCOUNTER — Ambulatory Visit: Payer: Medicare Other

## 2019-05-19 ENCOUNTER — Other Ambulatory Visit: Payer: Self-pay

## 2019-05-19 ENCOUNTER — Ambulatory Visit
Admission: RE | Admit: 2019-05-19 | Discharge: 2019-05-19 | Disposition: A | Discharge status: Home / Self Care | Payer: Medicare Other | Source: Ambulatory Visit | Attending: Internal Medicine | Admitting: Internal Medicine

## 2019-05-19 DIAGNOSIS — Z1231 Encounter for screening mammogram for malignant neoplasm of breast: Secondary | ICD-10-CM

## 2019-06-08 DIAGNOSIS — H5203 Hypermetropia, bilateral: Secondary | ICD-10-CM | POA: Diagnosis not present

## 2019-06-08 DIAGNOSIS — H2513 Age-related nuclear cataract, bilateral: Secondary | ICD-10-CM | POA: Diagnosis not present

## 2019-07-10 ENCOUNTER — Encounter: Payer: Medicare Other | Admitting: Internal Medicine

## 2019-07-10 DIAGNOSIS — Z0289 Encounter for other administrative examinations: Secondary | ICD-10-CM

## 2019-07-20 ENCOUNTER — Encounter: Payer: Medicare Other | Admitting: Internal Medicine

## 2019-07-20 DIAGNOSIS — Z0289 Encounter for other administrative examinations: Secondary | ICD-10-CM

## 2019-07-25 ENCOUNTER — Telehealth: Payer: Self-pay | Admitting: Internal Medicine

## 2019-07-25 NOTE — Telephone Encounter (Signed)
Called patient to schedule AWV, but patient did not answer; no voicemail set up. SF

## 2019-08-02 ENCOUNTER — Ambulatory Visit (INDEPENDENT_AMBULATORY_CARE_PROVIDER_SITE_OTHER): Payer: Medicare Other | Admitting: Internal Medicine

## 2019-08-02 ENCOUNTER — Encounter: Payer: Self-pay | Admitting: Internal Medicine

## 2019-08-02 ENCOUNTER — Other Ambulatory Visit: Payer: Self-pay

## 2019-08-02 VITALS — BP 128/88 | HR 78 | Temp 98.3°F | Ht 65.0 in | Wt 131.0 lb

## 2019-08-02 DIAGNOSIS — Z0000 Encounter for general adult medical examination without abnormal findings: Secondary | ICD-10-CM

## 2019-08-02 DIAGNOSIS — Z23 Encounter for immunization: Secondary | ICD-10-CM | POA: Diagnosis not present

## 2019-08-02 DIAGNOSIS — E785 Hyperlipidemia, unspecified: Secondary | ICD-10-CM | POA: Diagnosis not present

## 2019-08-02 DIAGNOSIS — E538 Deficiency of other specified B group vitamins: Secondary | ICD-10-CM | POA: Diagnosis not present

## 2019-08-02 DIAGNOSIS — E559 Vitamin D deficiency, unspecified: Secondary | ICD-10-CM | POA: Diagnosis not present

## 2019-08-02 DIAGNOSIS — I471 Supraventricular tachycardia, unspecified: Secondary | ICD-10-CM

## 2019-08-02 MED ORDER — ATORVASTATIN CALCIUM 20 MG PO TABS: 20.0000 mg | ORAL_TABLET | Freq: Every day | ORAL | 3 refills | Status: DC

## 2019-08-02 MED ORDER — METOPROLOL TARTRATE 25 MG PO TABS: 25.0000 mg | ORAL_TABLET | Freq: Two times a day (BID) | ORAL | 3 refills | Status: DC

## 2019-08-02 MED ORDER — ESCITALOPRAM OXALATE 5 MG PO TABS: 5.0000 mg | ORAL_TABLET | Freq: Every day | ORAL | 3 refills | Status: DC

## 2019-08-02 MED ORDER — FLUTICASONE PROPIONATE 50 MCG/ACT NA SUSP: 2.0000 | Freq: Every day | NASAL | 11 refills | Status: DC

## 2019-08-02 NOTE — Assessment & Plan Note (Signed)
On B12 Labs 

## 2019-08-02 NOTE — Patient Instructions (Addendum)
If you have medicare related insurance (such as traditional Medicare, [employer], [employer], or similar), Please make an appointment at the scheduling desk with Sharee Pimple, the [employer], for your Wellness visit in this office, which is a benefit with your insurance.    Mediterranean diet is good for you. ([employer]worth has a typical Mediterranean cuisine menu) The Mediterranean diet is a way of eating based on the traditional cuisine of countries bordering the [employer]. While there is no single definition of the Mediterranean diet, it is typically high in vegetables, fruits, whole grains, beans, nut and seeds, and olive oil. The main components of Mediterranean diet include: Marland Kitchen Daily consumption of vegetables, fruits, whole grains and healthy fats  . Weekly intake of fish, poultry, beans and eggs  . Moderate portions of dairy products  . Limited intake of red meat Other important elements of the Mediterranean diet are sharing meals with family and friends, enjoying a glass of red wine and being physically active. Health benefits of a Mediterranean diet: A traditional Mediterranean diet consisting of large quantities of fresh fruits and vegetables, nuts, fish and olive oil-coupled with physical activity-can reduce your risk of serious mental and physical health problems by: Preventing heart disease and strokes. Following a Mediterranean diet limits your intake of refined breads, processed foods, and red meat, and encourages drinking red wine instead of hard liquor-all factors that can help prevent heart disease and stroke. Keeping you agile. If you're an older adult, the nutrients gained with a Mediterranean diet may reduce your risk of developing muscle weakness and other signs of frailty by about 70 percent. Reducing the risk of Alzheimer's. Research suggests that the [place] diet may improve cholesterol, blood sugar levels, and overall blood vessel  health, which in turn may reduce your risk of Alzheimer's disease or dementia. Halving the risk of Parkinson's disease. The high levels of antioxidants in the Mediterranean diet can prevent cells from undergoing a damaging process called oxidative stress, thereby cutting the risk of Parkinson's disease in half. Increasing longevity. By reducing your risk of developing heart disease or cancer with the Mediterranean diet, you're reducing your risk of death at any age by 20%. Protecting against type 2 diabetes. A Mediterranean diet is rich in fiber which digests slowly, prevents huge swings in blood sugar, and can help you maintain a healthy weight.  Cardiac CT calcium scoring test $150   Computed tomography, more commonly known as a CT or CAT scan, is a diagnostic medical imaging test. Like traditional x-rays, it produces multiple images or pictures of the inside of the body. The cross-sectional images generated during a CT scan can be reformatted in multiple planes. They can even generate three-dimensional images. These images can be viewed on a computer monitor, printed on film or by a 3D printer, or transferred to a CD or DVD. CT images of internal organs, bones, soft tissue and blood vessels provide greater detail than traditional x-rays, particularly of soft tissues and blood vessels. A cardiac CT scan for coronary calcium is a non-invasive way of obtaining information about the presence, location and extent of calcified plaque in the coronary arteries-the vessels that supply oxygen-containing blood to the heart muscle. Calcified plaque results when there is a build-up of fat and other substances under the inner layer of the artery. This material can calcify which signals the presence of atherosclerosis, a disease of the vessel wall, also called coronary artery disease (CAD). People with this disease have  an increased risk for heart attacks. In addition, over time, progression of plaque build up (CAD)  can narrow the arteries or even close off blood flow to the heart. The result may be chest pain, sometimes called "angina," or a heart attack. Because calcium is a marker of CAD, the amount of calcium detected on a cardiac CT scan is a helpful prognostic tool. The findings on cardiac CT are expressed as a calcium score. Another name for this test is coronary artery calcium scoring.  What are some common uses of the procedure? The goal of cardiac CT scan for calcium scoring is to determine if CAD is present and to what extent, even if there are no symptoms. It is a screening study that may be recommended by a physician for patients with risk factors for CAD but no clinical symptoms. The major risk factors for CAD are: . high blood cholesterol levels  . family history of heart attacks  . diabetes  . high blood pressure  . cigarette smoking  . overweight or obese  . physical inactivity   A negative cardiac CT scan for calcium scoring shows no calcification within the coronary arteries. This suggests that CAD is absent or so minimal it cannot be seen by this technique. The chance of having a heart attack over the next two to five years is very low under these circumstances. A positive test means that CAD is present, regardless of whether or not the patient is experiencing any symptoms. The amount of calcification-expressed as the calcium score-may help to predict the likelihood of a myocardial infarction (heart attack) in the coming years and helps your medical doctor or cardiologist decide whether the patient may need to take preventive medicine or undertake other measures such as diet and exercise to lower the risk for heart attack. The extent of CAD is graded according to your calcium score:  Calcium Score  Presence of CAD (coronary artery disease)  0 No evidence of CAD   1-10 Minimal evidence of CAD  11-100 Mild evidence of CAD  101-400 Moderate evidence of CAD  Over 400 Extensive evidence of  CAD

## 2019-08-02 NOTE — Assessment & Plan Note (Addendum)
We discussed age appropriate health related issues, including available/recomended screening tests and vaccinations. We discussed a need for adhering to healthy diet and exercise. Labs were ordered to be later reviewed . All questions were answered.  A cardiac CT scan for calcium scoring offered Mammo q 12 mo Cologuard (-) 2019 Dexa offered 2020

## 2019-08-02 NOTE — Assessment & Plan Note (Signed)
Toprol XL 

## 2019-08-02 NOTE — Assessment & Plan Note (Addendum)
Lipitor A cardiac CT scan for calcium scoring offered

## 2019-08-02 NOTE — Progress Notes (Signed)
Subjective:  Patient ID: Janet Sherman, female    DOB: 1951/07/14  Age: 68 y.o. MRN: [id number]  CC: Annual Exam   HPI Janet Sherman presents for a well exam  F/u HTN, dyslipidemia, B12 def  Outpatient Medications Prior to Visit  Medication Sig Dispense Refill  . aspirin 81 MG chewable tablet Chew 1 tablet (81 mg total) by mouth daily. (Patient taking differently: Chew 81 mg by mouth daily as needed for mild pain. ) 30 tablet 0  . atorvastatin (LIPITOR) 20 MG tablet Take 1 tablet (20 mg total) by mouth daily. 90 tablet 3  . Cholecalciferol (VITAMIN D3) 2000 units capsule Take 1 capsule (2,000 Units total) by mouth daily. 100 capsule 3  . Cyanocobalamin (VITAMIN B-12) 1000 MCG SUBL Place 1 tablet (1,000 mcg total) under the tongue daily. 100 tablet 3  . escitalopram (LEXAPRO) 5 MG tablet Take 1 tablet (5 mg total) by mouth daily. 90 tablet 3  . fluticasone (FLONASE) 50 MCG/ACT nasal spray Place 2 sprays into both nostrils daily. 16 g 6  . ibuprofen (ADVIL,MOTRIN) 200 MG tablet Take 200 mg by mouth every 6 (six) hours as needed for moderate pain.    Marland Kitchen loratadine (CLARITIN) 10 MG tablet Take 1 tablet (10 mg total) by mouth daily. 30 tablet 11  . metoprolol tartrate (LOPRESSOR) 25 MG tablet Take 1 tablet (25 mg total) by mouth 2 (two) times daily. 180 tablet 3   No facility-administered medications prior to visit.     ROS: Review of Systems  Constitutional: Negative for activity change, appetite change, chills, fatigue and unexpected weight change.  HENT: Negative for congestion, mouth sores and sinus pressure.   Eyes: Negative for visual disturbance.  Respiratory: Negative for cough and chest tightness.   Gastrointestinal: Negative for abdominal pain and nausea.  Genitourinary: Negative for difficulty urinating, frequency and vaginal pain.  Musculoskeletal: Positive for arthralgias. Negative for back pain and gait problem.  Skin: Negative for pallor and rash.  Neurological: Negative  for dizziness, tremors, weakness, numbness and headaches.  Psychiatric/Behavioral: Negative for confusion, sleep disturbance and suicidal ideas.    Objective:  BP 128/88 (BP Location: Left Arm, Patient Position: Sitting, Cuff Size: Normal)   Pulse 78   Temp 98.3 F (36.8 C) (Oral)   Ht 5\' 5"  (1.651 m)   Wt 131 lb (59.4 kg)   SpO2 96%   BMI 21.80 kg/m   BP Readings from Last 3 Encounters:  08/02/19 128/88  07/08/18 132/80  01/07/18 136/82    Wt Readings from Last 3 Encounters:  08/02/19 131 lb (59.4 kg)  07/08/18 131 lb (59.4 kg)  01/07/18 135 lb (61.2 kg)    Physical Exam Constitutional:      General: She is not in acute distress.    Appearance: Normal appearance. She is well-developed.  HENT:     Head: Normocephalic.     Right Ear: External ear normal.     Left Ear: External ear normal.     Nose: Nose normal.  Eyes:     General:        Right eye: No discharge.        Left eye: No discharge.     Conjunctiva/sclera: Conjunctivae normal.     Pupils: Pupils are equal, round, and reactive to light.  Neck:     Musculoskeletal: Normal range of motion and neck supple.     Thyroid: No thyromegaly.     Vascular: No JVD.     Trachea: No tracheal  deviation.  Cardiovascular:     Rate and Rhythm: Normal rate and regular rhythm.     Heart sounds: Normal heart sounds.  Pulmonary:     Effort: No respiratory distress.     Breath sounds: No stridor. No wheezing.  Abdominal:     General: Bowel sounds are normal. There is no distension.     Palpations: Abdomen is soft. There is no mass.     Tenderness: There is no abdominal tenderness. There is no guarding or rebound.  Musculoskeletal:        General: No tenderness.  Lymphadenopathy:     Cervical: No cervical adenopathy.  Skin:    Findings: No erythema or rash.  Neurological:     Cranial Nerves: No cranial nerve deficit.     Motor: No abnormal muscle tone.     Coordination: Coordination normal.     Deep Tendon Reflexes:  Reflexes normal.  Psychiatric:        Behavior: Behavior normal.        Thought Content: Thought content normal.        Judgment: Judgment normal.     Lab Results  Component Value Date   WBC 8.9 08/31/2018   HGB 13.0 08/31/2018   HCT 38.7 08/31/2018   PLT 685.0 (H) 08/31/2018   GLUCOSE 115 (H) 08/31/2018   CHOL 144 08/31/2018   TRIG 91.0 08/31/2018   HDL 46.50 08/31/2018   LDLCALC 80 08/31/2018   ALT 9 08/31/2018   AST 17 08/31/2018   NA 140 08/31/2018   K 3.7 08/31/2018   CL 104 08/31/2018   CREATININE 0.89 08/31/2018   BUN 13 08/31/2018   CO2 29 08/31/2018   TSH 1.84 08/31/2018   HGBA1C 5.3 12/26/2016   MICROALBUR 0.98 08/29/2009    Mm 3d Screen Breast Bilateral  Result Date: 05/19/2019 CLINICAL DATA:  Screening. EXAM: DIGITAL SCREENING BILATERAL MAMMOGRAM WITH TOMO AND CAD COMPARISON:  Previous exam(s). ACR Breast Density Category b: There are scattered areas of fibroglandular density. FINDINGS: There are no findings suspicious for malignancy. Images were processed with CAD. IMPRESSION: No mammographic evidence of malignancy. A result letter of this screening mammogram will be mailed directly to the patient. RECOMMENDATION: Screening mammogram in one year. (Code:SM-B-01Y) BI-RADS CATEGORY  1: Negative. Electronically Signed   By: Curlene Dolphin M.D.   On: 05/19/2019 14:58    Assessment & Plan:   There are no diagnoses linked to this encounter.   No orders of the defined types were placed in this encounter.    Follow-up: No follow-ups on file.  Walker Kehr, MD

## 2019-08-02 NOTE — Assessment & Plan Note (Signed)
On Vit D Labs 

## 2019-08-09 ENCOUNTER — Encounter (HOSPITAL_COMMUNITY): Payer: Self-pay | Admitting: Emergency Medicine

## 2019-08-09 ENCOUNTER — Emergency Department (HOSPITAL_COMMUNITY): Payer: Medicare Other

## 2019-08-09 ENCOUNTER — Emergency Department (HOSPITAL_COMMUNITY)
Admission: EM | Admit: 2019-08-09 | Discharge: 2019-08-09 | Disposition: A | Discharge status: Home / Self Care | Payer: Medicare Other | Attending: Emergency Medicine | Admitting: Emergency Medicine

## 2019-08-09 DIAGNOSIS — R0789 Other chest pain: Secondary | ICD-10-CM

## 2019-08-09 DIAGNOSIS — I10 Essential (primary) hypertension: Secondary | ICD-10-CM | POA: Insufficient documentation

## 2019-08-09 DIAGNOSIS — Z87891 Personal history of nicotine dependence: Secondary | ICD-10-CM | POA: Diagnosis not present

## 2019-08-09 DIAGNOSIS — Z7982 Long term (current) use of aspirin: Secondary | ICD-10-CM | POA: Diagnosis not present

## 2019-08-09 DIAGNOSIS — R079 Chest pain, unspecified: Secondary | ICD-10-CM | POA: Diagnosis not present

## 2019-08-09 DIAGNOSIS — R51 Headache: Secondary | ICD-10-CM | POA: Diagnosis not present

## 2019-08-09 DIAGNOSIS — Z79899 Other long term (current) drug therapy: Secondary | ICD-10-CM | POA: Diagnosis not present

## 2019-08-09 LAB — DIFFERENTIAL
Abs Immature Granulocytes: 0 10*3/uL (ref 0.00–0.07)
Basophils Absolute: 0 10*3/uL (ref 0.0–0.1)
Basophils Relative: 0 %
Eosinophils Absolute: 1.1 10*3/uL — ABNORMAL HIGH (ref 0.0–0.5)
Eosinophils Relative: 11 %
Lymphocytes Relative: 23 %
Lymphs Abs: 2.3 10*3/uL (ref 0.7–4.0)
Monocytes Absolute: 0.5 10*3/uL (ref 0.1–1.0)
Monocytes Relative: 5 %
Neutro Abs: 6.2 10*3/uL (ref 1.7–7.7)
Neutrophils Relative %: 61 %

## 2019-08-09 LAB — I-STAT CHEM 8, ED
BUN: 12 mg/dL (ref 8–23)
Calcium, Ion: 1.22 mmol/L (ref 1.15–1.40)
Chloride: 105 mmol/L (ref 98–111)
Creatinine, Ser: 0.9 mg/dL (ref 0.44–1.00)
Glucose, Bld: 118 mg/dL — ABNORMAL HIGH (ref 70–99)
HCT: 46 % (ref 36.0–46.0)
Hemoglobin: 15.6 g/dL — ABNORMAL HIGH (ref 12.0–15.0)
Potassium: 3.4 mmol/L — ABNORMAL LOW (ref 3.5–5.1)
Sodium: 143 mmol/L (ref 135–145)
TCO2: 26 mmol/L (ref 22–32)

## 2019-08-09 LAB — COMPREHENSIVE METABOLIC PANEL
ALT: 15 U/L (ref 0–44)
AST: 20 U/L (ref 15–41)
Albumin: 3.8 g/dL (ref 3.5–5.0)
Alkaline Phosphatase: 60 U/L (ref 38–126)
Anion gap: 10 (ref 5–15)
BUN: 11 mg/dL (ref 8–23)
CO2: 25 mmol/L (ref 22–32)
Calcium: 9.5 mg/dL (ref 8.9–10.3)
Chloride: 107 mmol/L (ref 98–111)
Creatinine, Ser: 0.95 mg/dL (ref 0.44–1.00)
GFR calc Af Amer: >60 mL/min (ref >60)
GFR calc non Af Amer: >60 mL/min (ref >60)
Glucose, Bld: 122 mg/dL — ABNORMAL HIGH (ref 70–99)
Potassium: 3.6 mmol/L (ref 3.5–5.1)
Sodium: 142 mmol/L (ref 135–145)
Total Bilirubin: 0.4 mg/dL (ref 0.3–1.2)
Total Protein: 6.6 g/dL (ref 6.5–8.1)

## 2019-08-09 LAB — CBC
HCT: 44.9 % (ref 36.0–46.0)
Hemoglobin: 14.4 g/dL (ref 12.0–15.0)
MCH: 26.1 pg (ref 26.0–34.0)
MCHC: 32.1 g/dL (ref 30.0–36.0)
MCV: 81.5 fL (ref 80.0–100.0)
Platelets: 780 10*3/uL — ABNORMAL HIGH (ref 150–400)
RBC: 5.51 MIL/uL — ABNORMAL HIGH (ref 3.87–5.11)
RDW: 13.4 % (ref 11.5–15.5)
WBC: 10.1 10*3/uL (ref 4.0–10.5)
nRBC: 0 % (ref 0.0–0.2)

## 2019-08-09 LAB — TROPONIN I (HIGH SENSITIVITY)
Troponin I (High Sensitivity): 5 ng/L (ref <18)
Troponin I (High Sensitivity): 7 ng/L (ref <18)

## 2019-08-09 LAB — PROTIME-INR
INR: 1.3 — ABNORMAL HIGH (ref 0.8–1.2)
Prothrombin Time: 15.7 seconds — ABNORMAL HIGH (ref 11.4–15.2)

## 2019-08-09 LAB — APTT: aPTT: 41 seconds — ABNORMAL HIGH (ref 24–36)

## 2019-08-09 LAB — D-DIMER, QUANTITATIVE: D-Dimer, Quant: 0.37 ug/mL-FEU (ref 0.00–0.50)

## 2019-08-09 MED ORDER — SODIUM CHLORIDE 0.9% FLUSH: 3.0000 mL | Freq: Once | INTRAVENOUS | Status: DC

## 2019-08-09 NOTE — ED Notes (Signed)
Pt just returned from [facility]

## 2019-08-09 NOTE — ED Triage Notes (Signed)
Patient reports waking up this morning and feeling shaky and noticed her R side felt weaker and numb, specifically her foot. She's exhibited intermittent tremors in both arms during triage, states it has happened before. No extremity drift noted, grip strength equal, no facial droop, speech or vision changes. A&O x 4. Patient states she just feels different. LKW 11:30pm last night, symptoms present upon waking.

## 2019-08-09 NOTE — ED Notes (Signed)
Pt c/o only sl upper chest pressure not pain  She reports that she has bronchitis

## 2019-08-09 NOTE — ED Provider Notes (Addendum)
[facility] EMERGENCY DEPARTMENT Provider Note   CSN: [id number] Arrival date & time: 08/09/19  1036    History   Chief Complaint Chief Complaint  Patient presents with   Numbness    HPI Janet Sherman is a 68 y.o. female.     The history is provided by the patient and medical records. No language interpreter was used.   Janet Sherman is a 68 y.o. female who presents to the Emergency Department complaining of chest pain.  She presents to the ED complaining of right sided chest pain that began this morning.  Pain is described as sharp and pressure like in nature.  It started on the right and radiated to the left.  Pain is constant in nature.  She felt poorly when she woke up this morning.  Denies fever, HA, sob, leg edema.  She has associated cough for the last few weeks.  She had transient numbness in the right hand for a few seconds one week ago - now resolved. Denies weakness on initial questioning then states she felt weak in her right side a few days ago - now gone. History is limited as patient is a very vague historian. Past Medical History:  Diagnosis Date   Arthritis    "right hand" (01/22/2017)   Gallstones    History of kidney stones    Hypertension    Migraine    "a few/year" (01/22/2017)   Pneumonia X 1    Patient Active Problem List   Diagnosis Date Noted   Acute bronchitis 01/07/2018   Vitamin D deficiency 01/07/2018   B12 deficiency 01/07/2018   Dyslipidemia 06/09/2017   Acute sinusitis 06/09/2017   Hot flash, menopausal 06/09/2017   Well adult exam 03/09/2017   Paresthesia 03/09/2017   Stress 03/09/2017   Elevated troponin 12/26/2016   Shingles 12/25/2016   SVT (supraventricular tachycardia) ([place]) 12/25/2016   Pituitary adenoma with extrasellar extension ([place]) 10/16/2016   HYPERCHOLESTEROLEMIA 09/13/2009   TOBACCO ABUSE 05/22/2009   BREAST MASS, LEFT 04/20/2008   OSTEOPENIA 04/20/2008   NEPHROLITHIASIS  03/19/2008   POSTMENOPAUSAL SYNDROME 03/19/2008   HEMATURIA UNSPECIFIED 02/21/2008    Past Surgical History:  Procedure Laterality Date   CRANIOTOMY N/A 10/16/2016   Procedure: Pituitary tumor (benign) resection - CRANIOTOMY HYPOPHYSECTOMY TRANSNASAL APPROACH;  Surgeon: Consuella Lose, MD;  Location: [place];  Service: Neurosurgery;  Laterality: N/A;   ELECTROPHYSIOLOGIC STUDY N/A 01/22/2017   Procedure: SVT Ablation;  Surgeon: Will Meredith Leeds, MD;  Location: [place] [facility];  Service: Cardiovascular;  Laterality: N/A;   ELECTROPHYSIOLOGIC STUDY N/A 01/22/2017   Procedure: Electrophysiology Study;  Surgeon: Will Meredith Leeds, MD;  Location: [place] [facility];  Service: Cardiovascular;  Laterality: N/A;   gallstones  ~ 2000   KIDNEY STONE SURGERY     "cut me open"   SUPRAVENTRICULAR TACHYCARDIA ABLATION  01/22/2017   TRANSNASAL APPROACH N/A 10/16/2016   Procedure: TRANSNASAL APPROACH WITH FUSION;  Surgeon: Jerrell Belfast, MD;  Location: [place];  Service: ENT;  Laterality: N/A;   TUBAL LIGATION       OB History   No obstetric history on file.      Home Medications    Prior to Admission medications   Medication Sig Start Date End Date Taking? Authorizing Provider  aspirin 81 MG chewable tablet Chew 1 tablet (81 mg total) by mouth daily. 08/10/13  Yes Cleatrice Burke, PA-C  atorvastatin (LIPITOR) 20 MG tablet Take 1 tablet (20 mg total) by mouth daily. 08/02/19  Yes Plotnikov, Evie Lacks, MD  Cholecalciferol (VITAMIN D-3) 25 MCG (1000 UT) CAPS Take 1,000 Units by mouth daily.   Yes [provider]  Cyanocobalamin (VITAMIN B-12) 1000 MCG SUBL Place 1 tablet (1,000 mcg total) under the tongue daily. 03/10/17  Yes Plotnikov, Evie Lacks, MD  escitalopram (LEXAPRO) 5 MG tablet Take 1 tablet (5 mg total) by mouth daily. 08/02/19  Yes Plotnikov, Evie Lacks, MD  fluticasone (FLONASE) 50 MCG/ACT nasal spray Place 2 sprays into both nostrils daily. 08/02/19  Yes Plotnikov,  Evie Lacks, MD  ibuprofen (ADVIL,MOTRIN) 200 MG tablet Take 200 mg by mouth every 6 (six) hours as needed for mild pain.    Yes [provider]  loratadine (CLARITIN) 10 MG tablet Take 1 tablet (10 mg total) by mouth daily. Patient taking differently: Take 10 mg by mouth daily as needed for allergies or rhinitis.  01/07/18  Yes Plotnikov, Evie Lacks, MD  metoprolol tartrate (LOPRESSOR) 25 MG tablet Take 1 tablet (25 mg total) by mouth 2 (two) times daily. 08/02/19  Yes Plotnikov, Evie Lacks, MD  Multiple Vitamins-Minerals (CENTRUM SILVER 50+WOMEN) TABS Take 1 tablet by mouth daily with breakfast.   Yes [provider]  Cholecalciferol (VITAMIN D3) 2000 units capsule Take 1 capsule (2,000 Units total) by mouth daily. Patient not taking: Reported on 08/09/2019 03/10/17   Plotnikov, Evie Lacks, MD    Family History Family History  Problem Relation Age of Onset   Hypertension Mother    Cancer Father        lung   Hypertension Sister    Hypertension Brother     Social History Social History   Tobacco Use   Smoking status: Former Smoker    Packs/day: 1.00    Types: Cigarettes    Quit date: 10/16/2016    Years since quitting: 2.8   Smokeless tobacco: Never Used  Substance Use Topics   Alcohol use: No   Drug use: No     Allergies   Mold extract [trichophyton] and Pollen extract   Review of Systems Review of Systems  All other systems reviewed and are negative.    Physical Exam Updated Vital Signs BP (!) 142/81    Pulse 78    Temp 98.3 F (36.8 C) (Oral)    Resp 17    SpO2 (!) 86%   Physical Exam Vitals signs and nursing note reviewed.  Constitutional:      Appearance: She is well-developed.  HENT:     Head: Normocephalic and atraumatic.  Cardiovascular:     Rate and Rhythm: Normal rate and regular rhythm.     Heart sounds: No murmur.  Pulmonary:     Effort: Pulmonary effort is normal. No respiratory distress.     Breath sounds: Normal breath  sounds.  Abdominal:     Palpations: Abdomen is soft.     Tenderness: There is no abdominal tenderness. There is no guarding or rebound.  Musculoskeletal:        General: No swelling or tenderness.     Comments: 2+ radial and DP pulses bilaterally.  Skin:    General: Skin is warm and dry.     Capillary Refill: Capillary refill takes less than 2 seconds.  Neurological:     Mental Status: She is alert and oriented to person, place, and time.     Comments: No asymmetry of facial movements. Visual fields are grossly intact. Five out of five strength in all four extremities with sensation to light touch intact  in all four extremities. No pronator drift.  Psychiatric:        Mood and Affect: Mood normal.        Behavior: Behavior normal.      ED Treatments / Results  Labs (all labs ordered are listed, but only abnormal results are displayed) Labs Reviewed  PROTIME-INR - Abnormal; Notable for the following components:      Result Value   Prothrombin Time 15.7 (*)    INR 1.3 (*)    All other components within normal limits  APTT - Abnormal; Notable for the following components:   aPTT 41 (*)    All other components within normal limits  CBC - Abnormal; Notable for the following components:   RBC 5.51 (*)    Platelets 780 (*)    All other components within normal limits  DIFFERENTIAL - Abnormal; Notable for the following components:   Eosinophils Absolute 1.1 (*)    All other components within normal limits  COMPREHENSIVE METABOLIC PANEL - Abnormal; Notable for the following components:   Glucose, Bld 122 (*)    All other components within normal limits  I-STAT CHEM 8, ED - Abnormal; Notable for the following components:   Potassium 3.4 (*)    Glucose, Bld 118 (*)    Hemoglobin 15.6 (*)    All other components within normal limits  D-DIMER, QUANTITATIVE (NOT AT [facility])  CBG MONITORING, ED  TROPONIN I (HIGH SENSITIVITY)  TROPONIN I (HIGH SENSITIVITY)  TROPONIN I (HIGH  SENSITIVITY)  TROPONIN I (HIGH SENSITIVITY)    EKG EKG Interpretation  Date/Time:  Wednesday August 09 2019 12:14:45 EDT Ventricular Rate:  64 PR Interval:    QRS Duration: 95 QT Interval:  390 QTC Calculation: 403 R Axis:   -56 Text Interpretation:  Age not entered, assumed to be  68 years old for purpose of ECG interpretation Sinus rhythm Left anterior fascicular block Probable anteroseptal infarct, old Borderline T abnormalities, inferior leads Confirmed by Quintella Reichert [phone]) on 08/09/2019 12:25:51 PM   Radiology Dg Chest 2 View  Result Date: 08/09/2019 CLINICAL DATA:  Chest pain EXAM: CHEST - 2 VIEW COMPARISON:  12/25/2016 FINDINGS: Cardiomediastinal contours are within normal limits. Pulmonary vascularity is unremarkable. No focal airspace consolidation, pleural effusion, or pneumothorax. IMPRESSION: No active cardiopulmonary disease. Electronically Signed   By: Davina Poke M.D.   On: 08/09/2019 13:05   Ct Head Wo Contrast  Result Date: 08/09/2019 CLINICAL DATA:  Headaches EXAM: CT HEAD WITHOUT CONTRAST TECHNIQUE: Contiguous axial images were obtained from the base of the skull through the vertex without intravenous contrast. COMPARISON:  07/28/2016 FINDINGS: Brain: No evidence of acute infarction, hemorrhage, hydrocephalus, extra-axial collection or mass lesion/mass effect. 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 Vascular: No hyperdense vessel or unexpected calcification. Skull: Normal. Negative for fracture or focal lesion. Sinuses/Orbits: Interval postsurgical changes noted of the sphenoid sinuses. Mucosal thickening noted within the bilateral maxillary sinuses and scattered within the ethmoid air cells. Orbital structures intact. Other: None. IMPRESSION: 1. No acute intracranial findings. 2. 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 3. Chronic paranasal sinus disease. Electronically Signed   By: Davina Poke M.D.   On:  08/09/2019 13:02    Procedures Procedures (including critical care time)  Medications Ordered in ED Medications  sodium chloride flush (NS) 0.9 % injection 3 mL (has no administration in time range)     Initial Impression / Assessment and Plan / ED Course  I have reviewed the  triage vital signs and the nursing notes.  Pertinent labs & imaging results that were available during my care of the patient were reviewed by me and considered in my medical decision making (see chart for details).        Patient here for evaluation of waxing and waning chest pain. EKG without acute ischemic changes in troponin is negative times two. She also complains of some numbness in her arm that was present a few weeks ago, now gone. She is neurovascularly intact on examination. Presentation is not consistent with ACS, PE, dissection, CVA, TIA. Discussed with patient home care for atypical chest pain. Discussed outpatient follow-up and return precautions.   Nursing notes reviewed. Patient did not receive an MRI during this ED stay. She also did not have hypoxia at 86%, this vital sign was documented in error. Discharge pulse ox 99% on room air. Final Clinical Impressions(s) / ED Diagnoses   Final diagnoses:  Atypical chest pain    ED Discharge Orders    None       Quintella Reichert, MD 08/09/19 1706    Quintella Reichert, MD 08/09/19 [phone]

## 2019-08-15 ENCOUNTER — Inpatient Hospital Stay: Payer: Medicare Other | Admitting: Internal Medicine

## 2019-10-07 ENCOUNTER — Ambulatory Visit (INDEPENDENT_AMBULATORY_CARE_PROVIDER_SITE_OTHER): Payer: Medicare Other

## 2019-10-07 ENCOUNTER — Other Ambulatory Visit: Payer: Self-pay

## 2019-10-07 DIAGNOSIS — Z23 Encounter for immunization: Secondary | ICD-10-CM

## 2019-10-18 ENCOUNTER — Telehealth: Payer: Medicare Other | Admitting: Family

## 2019-11-16 ENCOUNTER — Other Ambulatory Visit: Payer: Self-pay

## 2019-11-16 NOTE — Patient Outreach (Signed)
[facility]) Care Management  11/16/2019  Alicemae Roed 10/31/1951 [id number]   Medication Adherence call to Mrs. Tiffany Kocher [place] Identifiers Verify spoke with patient she is past due on Atorvastatin 20 mg,patient explain she takes 1 tablet daily and takes it on a regular basis,patient explain she has a prescription ready at [facility] and  will pick up today. Mrs. Segler is showing past due under [place].   [place] Management Direct Dial [phone]  Fax [phone] [email or web address]

## 2020-04-05 ENCOUNTER — Ambulatory Visit: Payer: Medicare Other | Attending: Internal Medicine

## 2020-04-05 DIAGNOSIS — Z23 Encounter for immunization: Secondary | ICD-10-CM

## 2020-04-05 NOTE — Progress Notes (Signed)
   Covid-19 Vaccination Clinic  Name:  Janet Sherman    MRN: [id number] DOB: [birth year]  04/05/2020  [name] was observed post Covid-19 immunization for 15 minutes without incident. She was provided with Vaccine Information Sheet and instruction to access the V-Safe system.   [name] was instructed to call 911 with any severe reactions post vaccine: Marland Kitchen Difficulty breathing  . Swelling of face and throat  . A fast heartbeat  . A bad rash all over body  . Dizziness and weakness   Immunizations Administered    Name Date Dose VIS Date Route   Pfizer COVID-19 Vaccine 04/05/2020 12:15 PM 0.3 mL 12/08/2019 Intramuscular   Manufacturer: [place]   Lot: [id number]   [place]: [id number]

## 2020-04-18 ENCOUNTER — Other Ambulatory Visit: Payer: Self-pay | Admitting: Internal Medicine

## 2020-04-18 DIAGNOSIS — Z1231 Encounter for screening mammogram for malignant neoplasm of breast: Secondary | ICD-10-CM

## 2020-04-29 ENCOUNTER — Ambulatory Visit: Payer: Medicare Other

## 2020-05-01 ENCOUNTER — Ambulatory Visit: Payer: Medicare Other

## 2020-08-05 ENCOUNTER — Encounter: Payer: Medicare Other | Admitting: Internal Medicine

## 2020-08-05 DIAGNOSIS — Z0289 Encounter for other administrative examinations: Secondary | ICD-10-CM

## 2020-08-06 DIAGNOSIS — H5203 Hypermetropia, bilateral: Secondary | ICD-10-CM | POA: Diagnosis not present

## 2020-08-06 DIAGNOSIS — H2513 Age-related nuclear cataract, bilateral: Secondary | ICD-10-CM | POA: Diagnosis not present

## 2020-08-21 ENCOUNTER — Encounter: Payer: Medicare Other | Admitting: Internal Medicine

## 2020-09-09 ENCOUNTER — Encounter: Payer: Self-pay | Admitting: Internal Medicine

## 2020-09-09 ENCOUNTER — Other Ambulatory Visit: Payer: Self-pay

## 2020-09-09 ENCOUNTER — Ambulatory Visit (INDEPENDENT_AMBULATORY_CARE_PROVIDER_SITE_OTHER): Payer: Medicare Other | Admitting: Internal Medicine

## 2020-09-09 VITALS — BP 150/90 | HR 86 | Temp 99.0°F | Ht 65.0 in | Wt 125.0 lb

## 2020-09-09 DIAGNOSIS — R030 Elevated blood-pressure reading, without diagnosis of hypertension: Secondary | ICD-10-CM | POA: Insufficient documentation

## 2020-09-09 DIAGNOSIS — Z0001 Encounter for general adult medical examination with abnormal findings: Secondary | ICD-10-CM

## 2020-09-09 DIAGNOSIS — I10 Essential (primary) hypertension: Secondary | ICD-10-CM | POA: Insufficient documentation

## 2020-09-09 DIAGNOSIS — F439 Reaction to severe stress, unspecified: Secondary | ICD-10-CM | POA: Diagnosis not present

## 2020-09-09 DIAGNOSIS — F17200 Nicotine dependence, unspecified, uncomplicated: Secondary | ICD-10-CM

## 2020-09-09 DIAGNOSIS — E538 Deficiency of other specified B group vitamins: Secondary | ICD-10-CM | POA: Diagnosis not present

## 2020-09-09 DIAGNOSIS — E559 Vitamin D deficiency, unspecified: Secondary | ICD-10-CM | POA: Diagnosis not present

## 2020-09-09 DIAGNOSIS — Z23 Encounter for immunization: Secondary | ICD-10-CM | POA: Diagnosis not present

## 2020-09-09 DIAGNOSIS — Z0000 Encounter for general adult medical examination without abnormal findings: Secondary | ICD-10-CM

## 2020-09-09 DIAGNOSIS — Z1231 Encounter for screening mammogram for malignant neoplasm of breast: Secondary | ICD-10-CM

## 2020-09-09 MED ORDER — ESCITALOPRAM OXALATE 10 MG PO TABS: 10.0000 mg | ORAL_TABLET | Freq: Every day | ORAL | 3 refills | Status: DC

## 2020-09-09 MED ORDER — ATORVASTATIN CALCIUM 20 MG PO TABS: 20.0000 mg | ORAL_TABLET | Freq: Every day | ORAL | 3 refills | Status: DC

## 2020-09-09 MED ORDER — ESCITALOPRAM OXALATE 5 MG PO TABS: 5.0000 mg | ORAL_TABLET | Freq: Every day | ORAL | 3 refills | Status: DC

## 2020-09-09 MED ORDER — METOPROLOL TARTRATE 25 MG PO TABS: 25.0000 mg | ORAL_TABLET | Freq: Two times a day (BID) | ORAL | 3 refills | Status: DC

## 2020-09-09 NOTE — Assessment & Plan Note (Signed)
Vit B12 

## 2020-09-09 NOTE — Assessment & Plan Note (Signed)
Not smoking.  

## 2020-09-09 NOTE — Addendum Note (Signed)
Addended by: Darlys Gales on: 09/09/2020 03:51 PM   Modules accepted: Orders

## 2020-09-09 NOTE — Assessment & Plan Note (Signed)
Vit D 

## 2020-09-09 NOTE — Assessment & Plan Note (Addendum)
We discussed age appropriate health related issues, including available/recomended screening tests and vaccinations. We discussed a need for adhering to healthy diet and exercise. Labs were ordered to be later reviewed . All questions were answered.  Flu shot mammo

## 2020-09-09 NOTE — Assessment & Plan Note (Signed)
Monitor BP at home

## 2020-09-09 NOTE — Progress Notes (Signed)
Subjective:  Patient ID: Janet Sherman, female    DOB: [birth year]  Age: 69 y.o. MRN: [id number]  CC: No chief complaint on file.   HPI Janet Sherman presents for a well exam. Husband was dx'ed w/prostate cancer in 3/21 - c/o stress: he is ?terminal stage per pt  Outpatient Medications Prior to Visit  Medication Sig Dispense Refill  . aspirin 81 MG chewable tablet Chew 1 tablet (81 mg total) by mouth daily. 30 tablet 0  . atorvastatin (LIPITOR) 20 MG tablet Take 1 tablet (20 mg total) by mouth daily. 90 tablet 3  . Cholecalciferol (VITAMIN D-3) 25 MCG (1000 UT) CAPS Take 1,000 Units by mouth daily.    . Cholecalciferol (VITAMIN D3) 2000 units capsule Take 1 capsule (2,000 Units total) by mouth daily. 100 capsule 3  . Cyanocobalamin (VITAMIN B-12) 1000 MCG SUBL Place 1 tablet (1,000 mcg total) under the tongue daily. 100 tablet 3  . escitalopram (LEXAPRO) 5 MG tablet Take 1 tablet (5 mg total) by mouth daily. 90 tablet 3  . fluticasone (FLONASE) 50 MCG/ACT nasal spray Place 2 sprays into both nostrils daily. 16 g 11  . ibuprofen (ADVIL,MOTRIN) 200 MG tablet Take 200 mg by mouth every 6 (six) hours as needed for mild pain.     Marland Kitchen loratadine (CLARITIN) 10 MG tablet Take 1 tablet (10 mg total) by mouth daily. (Patient taking differently: Take 10 mg by mouth daily as needed for allergies or rhinitis. ) 30 tablet 11  . metoprolol tartrate (LOPRESSOR) 25 MG tablet Take 1 tablet (25 mg total) by mouth 2 (two) times daily. 180 tablet 3  . Multiple Vitamins-Minerals (CENTRUM SILVER 50+WOMEN) TABS Take 1 tablet by mouth daily with breakfast.     No facility-administered medications prior to visit.    ROS: Review of Systems  Constitutional: Positive for unexpected weight change. Negative for activity change, appetite change, chills and fatigue.  HENT: Negative for congestion, mouth sores and sinus pressure.   Eyes: Negative for visual disturbance.  Respiratory: Negative for cough and chest  tightness.   Gastrointestinal: Negative for abdominal pain and nausea.  Genitourinary: Negative for difficulty urinating, frequency and vaginal pain.  Musculoskeletal: Positive for arthralgias. Negative for back pain and gait problem.  Skin: Negative for pallor and rash.  Neurological: Negative for dizziness, tremors, weakness, numbness and headaches.  Psychiatric/Behavioral: Negative for confusion, sleep disturbance and suicidal ideas. The patient is nervous/anxious.     Objective:  BP (!) 150/90 (BP Location: Left Arm, Patient Position: Sitting, Cuff Size: Normal)   Pulse 86   Temp 99 F (37.2 C) (Oral)   Ht 5\' 5"  (1.651 m)   Wt 125 lb (56.7 kg)   SpO2 95%   BMI 20.80 kg/m   BP Readings from Last 3 Encounters:  09/09/20 (!) 150/90  08/09/19 (!) 143/80  08/02/19 128/88    Wt Readings from Last 3 Encounters:  09/09/20 125 lb (56.7 kg)  08/02/19 131 lb (59.4 kg)  07/08/18 131 lb (59.4 kg)    Physical Exam Constitutional:      General: She is not in acute distress.    Appearance: Normal appearance. She is well-developed.  HENT:     Head: Normocephalic.     Right Ear: External ear normal.     Left Ear: External ear normal.     Nose: Nose normal.  Eyes:     General:        Right eye: No discharge.  Left eye: No discharge.     Conjunctiva/sclera: Conjunctivae normal.     Pupils: Pupils are equal, round, and reactive to light.  Neck:     Thyroid: No thyromegaly.     Vascular: No JVD.     Trachea: No tracheal deviation.  Cardiovascular:     Rate and Rhythm: Normal rate and regular rhythm.     Heart sounds: Normal heart sounds.  Pulmonary:     Effort: No respiratory distress.     Breath sounds: No stridor. No wheezing.  Abdominal:     General: Bowel sounds are normal. There is no distension.     Palpations: Abdomen is soft. There is no mass.     Tenderness: There is no abdominal tenderness. There is no guarding or rebound.  Musculoskeletal:        General:  No tenderness.     Cervical back: Normal range of motion and neck supple.  Lymphadenopathy:     Cervical: No cervical adenopathy.  Skin:    Findings: No erythema or rash.  Neurological:     Mental Status: She is oriented to person, place, and time.     Cranial Nerves: No cranial nerve deficit.     Motor: No abnormal muscle tone.     Coordination: Coordination normal.     Deep Tendon Reflexes: Reflexes normal.  Psychiatric:        Behavior: Behavior normal.        Thought Content: Thought content normal.        Judgment: Judgment normal.     Lab Results  Component Value Date   WBC 10.1 08/09/2019   HGB 15.6 (H) 08/09/2019   HCT 46.0 08/09/2019   PLT 780 (H) 08/09/2019   GLUCOSE 118 (H) 08/09/2019   CHOL 144 08/31/2018   TRIG 91.0 08/31/2018   HDL 46.50 08/31/2018   LDLCALC 80 08/31/2018   ALT 15 08/09/2019   AST 20 08/09/2019   NA 143 08/09/2019   K 3.4 (L) 08/09/2019   CL 105 08/09/2019   CREATININE 0.90 08/09/2019   BUN 12 08/09/2019   CO2 25 08/09/2019   TSH 1.84 08/31/2018   INR 1.3 (H) 08/09/2019   HGBA1C 5.3 12/26/2016   MICROALBUR 0.98 08/29/2009    DG Chest 2 View  Result Date: 08/09/2019 CLINICAL DATA:  Chest pain EXAM: CHEST - 2 VIEW COMPARISON:  12/25/2016 FINDINGS: Cardiomediastinal contours are within normal limits. Pulmonary vascularity is unremarkable. No focal airspace consolidation, pleural effusion, or pneumothorax. IMPRESSION: No active cardiopulmonary disease. Electronically Signed   By: Davina Poke M.D.   On: 08/09/2019 13:05   CT HEAD WO CONTRAST  Result Date: 08/09/2019 CLINICAL DATA:  Headaches EXAM: CT HEAD WITHOUT CONTRAST TECHNIQUE: Contiguous axial images were obtained from the base of the skull through the vertex without intravenous contrast. COMPARISON:  07/28/2016 FINDINGS: Brain: No evidence of acute infarction, hemorrhage, hydrocephalus, extra-axial collection or mass lesion/mass effect. There is been interval surgical resection  of previously seen sellar mass. Vascular: No hyperdense vessel or unexpected calcification. Skull: Normal. Negative for fracture or focal lesion. Sinuses/Orbits: Interval postsurgical changes noted of the sphenoid sinuses. Mucosal thickening noted within the bilateral maxillary sinuses and scattered within the ethmoid air cells. Orbital structures intact. Other: None. IMPRESSION: 1. No acute intracranial findings. 2. 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 3. Chronic paranasal sinus disease. Electronically Signed   By: Davina Poke M.D.   On: 08/09/2019 13:02    Assessment & Plan:  Walker Kehr, MD

## 2020-09-09 NOTE — Assessment & Plan Note (Addendum)
Worse Husband was dx'ed w/prostate cancer in 3/21 - c/o stress: he is ?terminal stage per pt Discussed Increase Lexapro to 10 mg/d

## 2020-09-16 ENCOUNTER — Other Ambulatory Visit: Payer: Self-pay | Admitting: Internal Medicine

## 2020-10-05 IMAGING — MG DIGITAL SCREENING BILATERAL MAMMOGRAM WITH TOMO AND CAD
8 series · 9 of 24 positions shown · non-contrast
Comparison: Previous exam(s).

CLINICAL DATA: Screening.

EXAM:
DIGITAL SCREENING BILATERAL MAMMOGRAM WITH TOMO AND CAD

[R CC synth-2D]
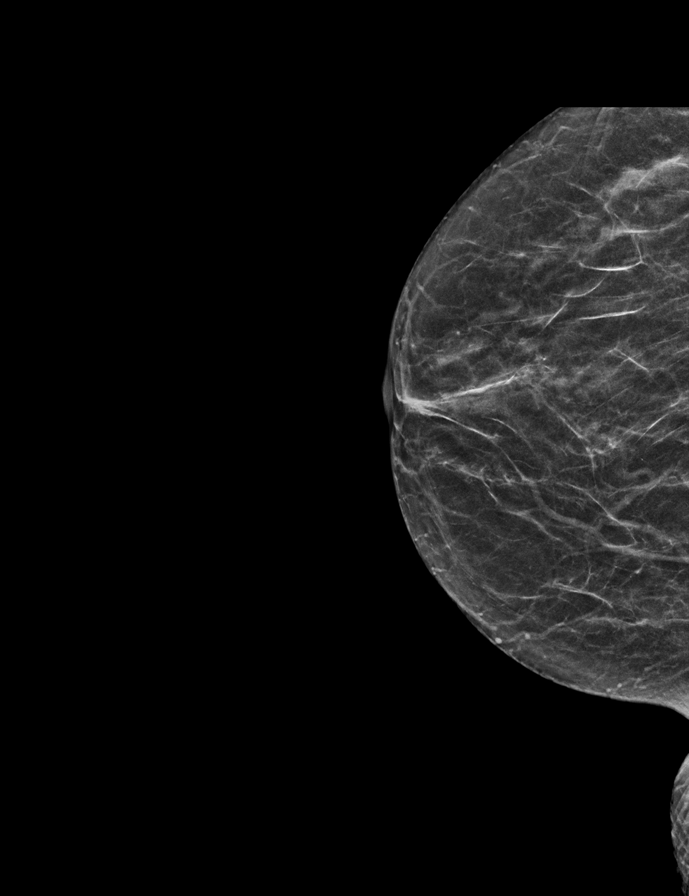

[L CC synth-2D]
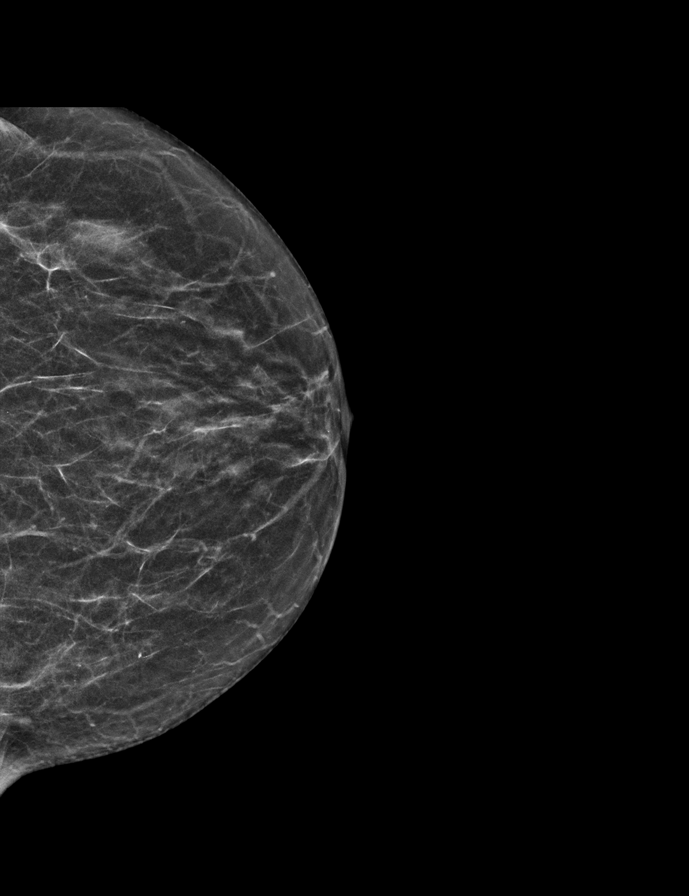

[L MLO synth-2D]
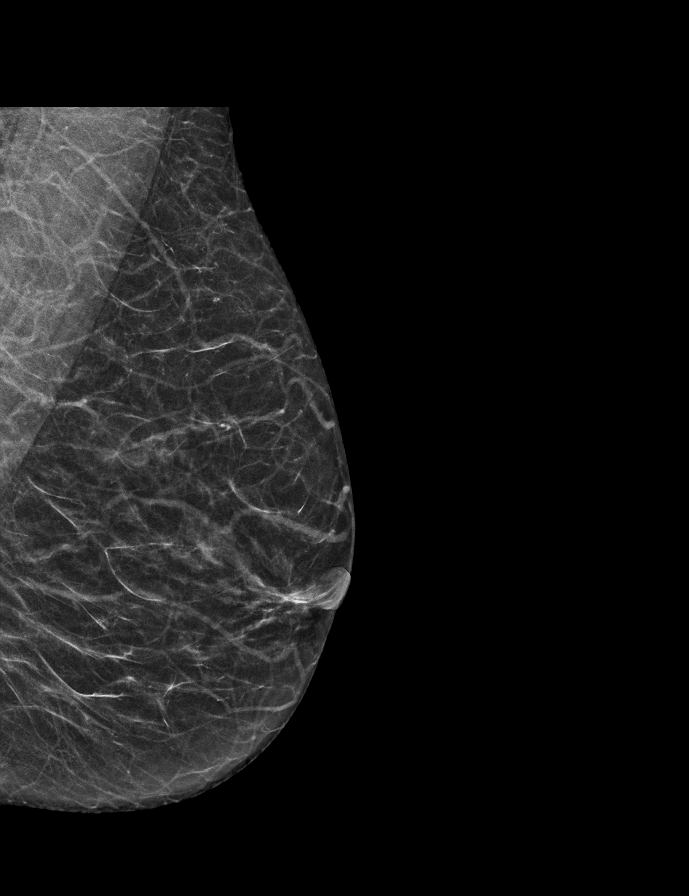

[R MLO synth-2D]
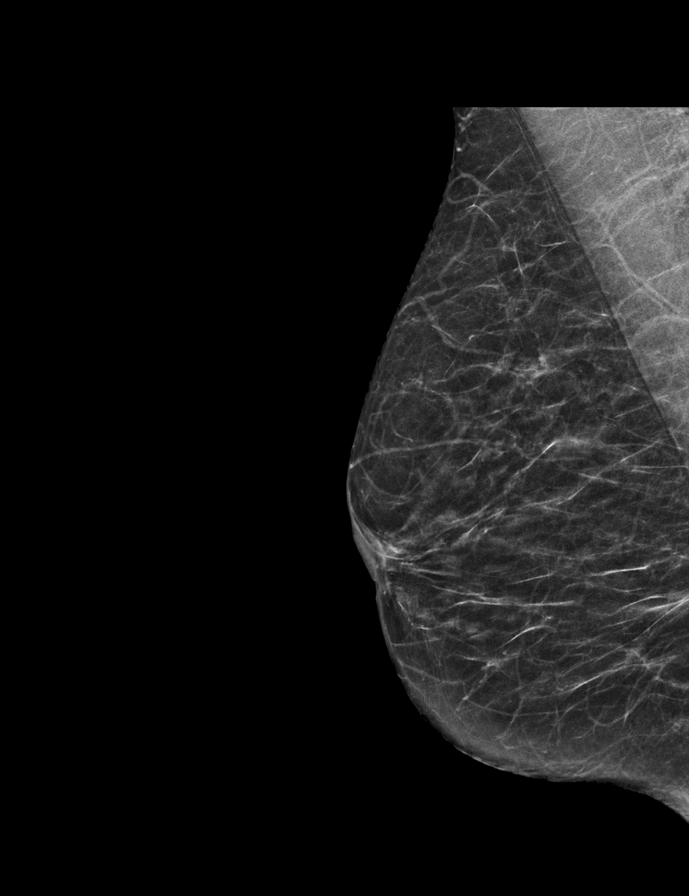

[L CC tomo · 2 of 42 frames shown]
[frame 14/42]
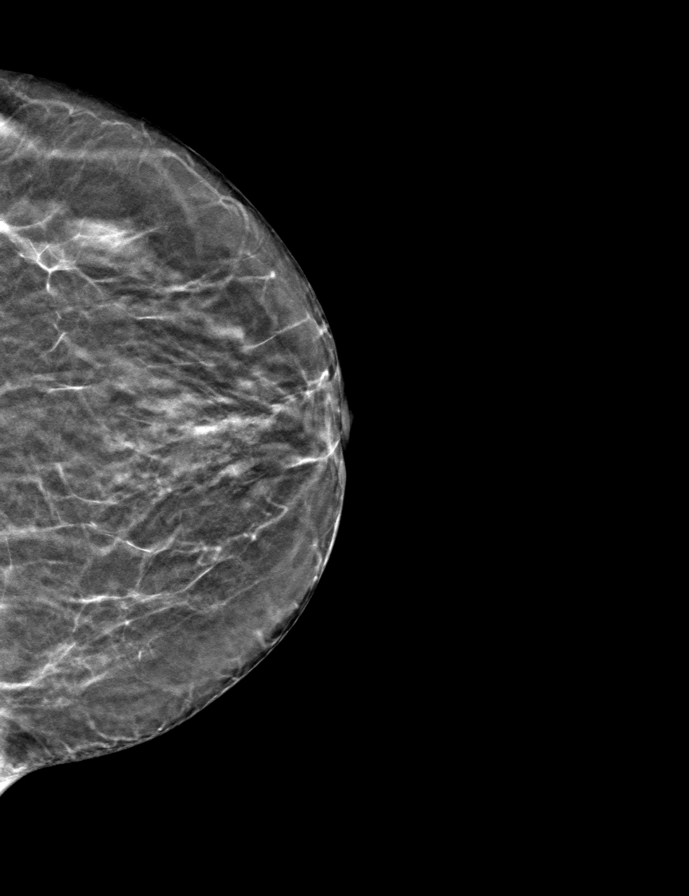
[frame 21/42]
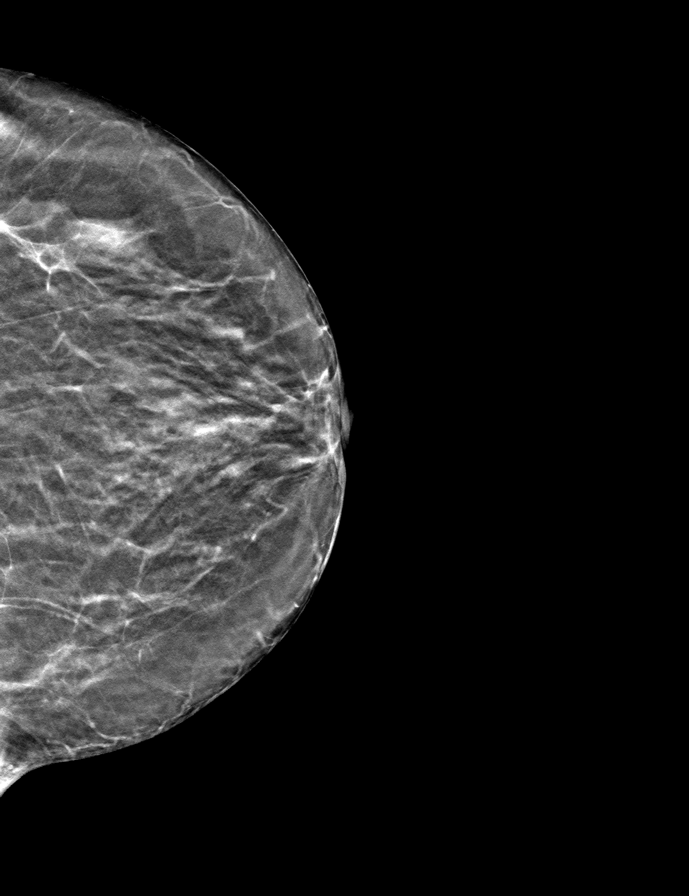

[L MLO tomo · tomo slice 23/45.0]
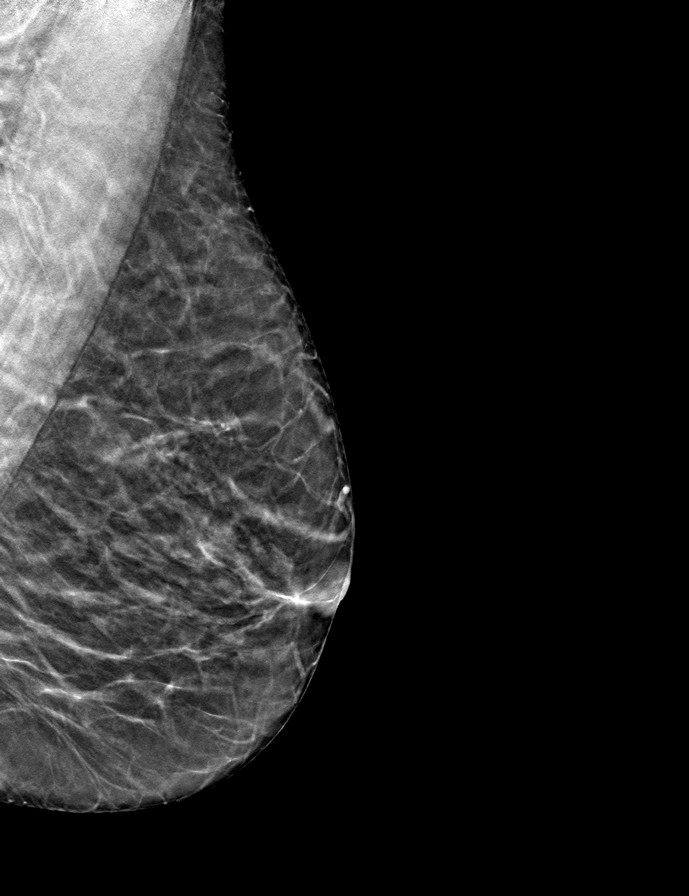

[R MLO tomo · tomo slice 23/45.0]
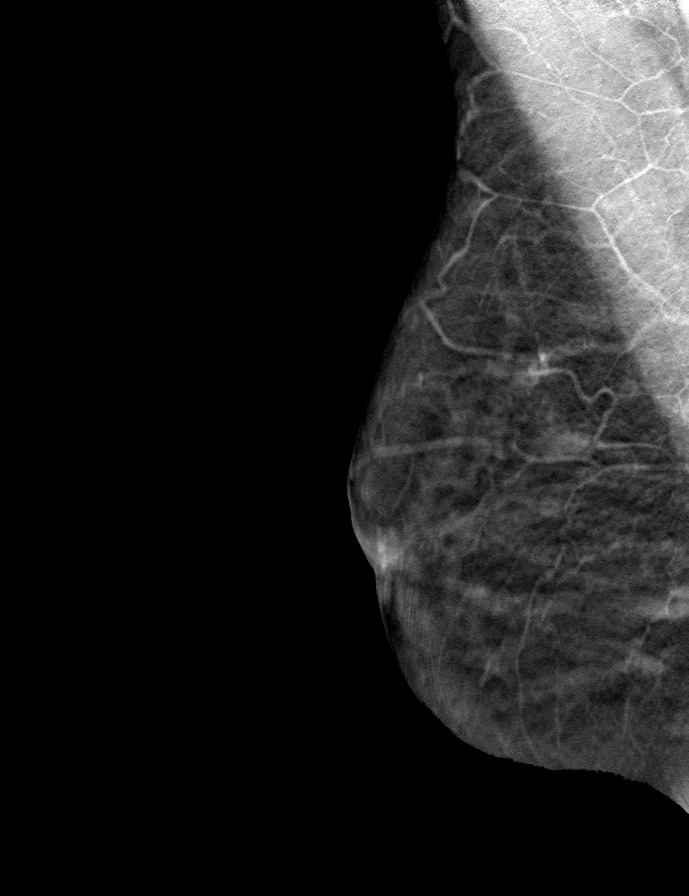

[R CC tomo · tomo slice 23/44.0]
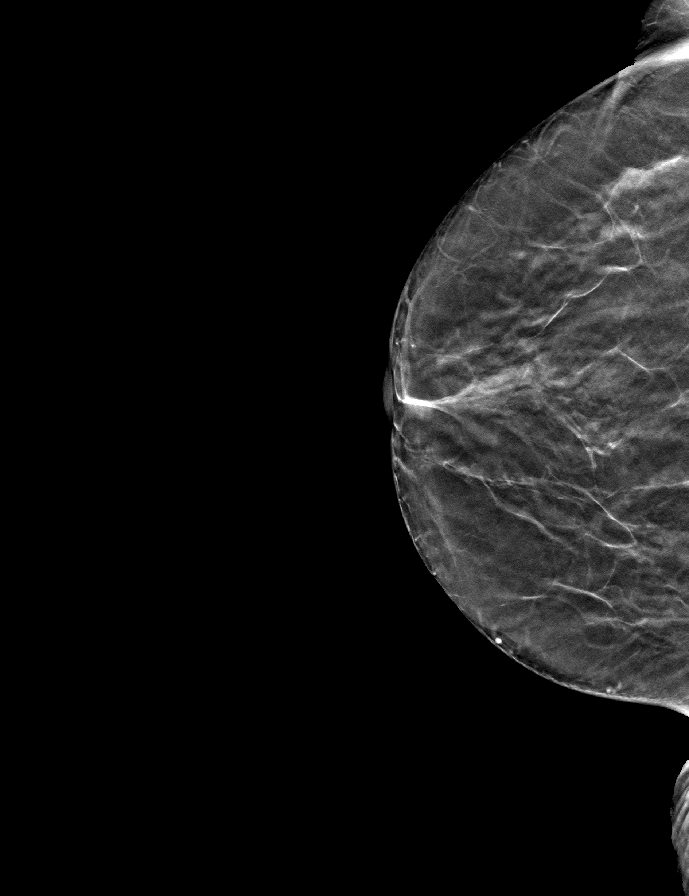

[9 of 24 positions shown; findings below may reference images not displayed]

ACR Breast Density Category b: There are scattered areas of
fibroglandular density.
FINDINGS: There are no findings suspicious for malignancy. Images were
processed with CAD.
IMPRESSION: No mammographic evidence of malignancy. A result letter of this
screening mammogram will be mailed directly to the patient.

RECOMMENDATION:
Screening mammogram in one year. (Code:[id number])

BI-RADS CATEGORY  1: Negative.

## 2020-12-02 ENCOUNTER — Telehealth: Payer: Self-pay | Admitting: *Deleted

## 2020-12-02 DIAGNOSIS — R399 Unspecified symptoms and signs involving the genitourinary system: Secondary | ICD-10-CM

## 2020-12-02 NOTE — Telephone Encounter (Signed)
Please hold Lipitor for 4-6 weeks. Take vitamin D daily. Use Tylenol as needed for pain See me in the office in 4 weeks. Thanks,

## 2020-12-02 NOTE — Telephone Encounter (Signed)
Daughter called and states mom been having some pain issues. She wanted to know if Plot can call in the meds that he had her on for back pain...[name]

## 2020-12-03 NOTE — Addendum Note (Signed)
Addended by: Earnstine Regal on: 12/03/2020 01:50 PM   Modules accepted: Orders

## 2020-12-03 NOTE — Telephone Encounter (Signed)
Notified pt daughter Janet Sherman w/MD response.Marland KitchenJohny Chess

## 2020-12-03 NOTE — Telephone Encounter (Signed)
Daughter states mom is wanting urine check to make sure she don't have UTI. Place order, and made appt for 12/31/20 @ 1:20.Marland KitchenJohny Chess

## 2020-12-04 ENCOUNTER — Other Ambulatory Visit (INDEPENDENT_AMBULATORY_CARE_PROVIDER_SITE_OTHER): Payer: Medicare Other

## 2020-12-04 DIAGNOSIS — R399 Unspecified symptoms and signs involving the genitourinary system: Secondary | ICD-10-CM | POA: Diagnosis not present

## 2020-12-04 LAB — URINALYSIS, ROUTINE W REFLEX MICROSCOPIC
Bilirubin Urine: NEGATIVE
Hgb urine dipstick: NEGATIVE
Ketones, ur: NEGATIVE
Nitrite: NEGATIVE
Specific Gravity, Urine: 1.02 (ref 1.000–1.030)
Total Protein, Urine: NEGATIVE
Urine Glucose: NEGATIVE
Urobilinogen, UA: 0.2 (ref 0.0–1.0)
pH: 7 (ref 5.0–8.0)

## 2020-12-05 ENCOUNTER — Other Ambulatory Visit: Payer: Self-pay | Admitting: Internal Medicine

## 2020-12-05 MED ORDER — CEFUROXIME AXETIL 250 MG PO TABS: 250.0000 mg | ORAL_TABLET | Freq: Two times a day (BID) | ORAL | 0 refills | Status: DC

## 2020-12-31 ENCOUNTER — Other Ambulatory Visit: Payer: Self-pay

## 2020-12-31 ENCOUNTER — Encounter: Payer: Self-pay | Admitting: Internal Medicine

## 2020-12-31 ENCOUNTER — Ambulatory Visit (INDEPENDENT_AMBULATORY_CARE_PROVIDER_SITE_OTHER): Payer: Medicare Other | Admitting: Internal Medicine

## 2020-12-31 DIAGNOSIS — J0100 Acute maxillary sinusitis, unspecified: Secondary | ICD-10-CM

## 2020-12-31 DIAGNOSIS — E538 Deficiency of other specified B group vitamins: Secondary | ICD-10-CM | POA: Diagnosis not present

## 2020-12-31 DIAGNOSIS — R030 Elevated blood-pressure reading, without diagnosis of hypertension: Secondary | ICD-10-CM

## 2020-12-31 DIAGNOSIS — E559 Vitamin D deficiency, unspecified: Secondary | ICD-10-CM | POA: Diagnosis not present

## 2020-12-31 DIAGNOSIS — F4321 Adjustment disorder with depressed mood: Secondary | ICD-10-CM

## 2020-12-31 MED ORDER — CEFDINIR 300 MG PO CAPS: 300.0000 mg | ORAL_CAPSULE | Freq: Two times a day (BID) | ORAL | 0 refills | Status: DC

## 2020-12-31 NOTE — Assessment & Plan Note (Signed)
Lexapro 10 mg/d

## 2020-12-31 NOTE — Progress Notes (Signed)
Subjective:  Patient ID: Janet Sherman, female    DOB: [birth year]  Age: 70 y.o. MRN: [id number]  CC: Follow-up (Back Pain)   HPI Janet Sherman presents for HTN, stress w/husband  Outpatient Medications Prior to Visit  Medication Sig Dispense Refill  . aspirin 81 MG chewable tablet Chew 1 tablet (81 mg total) by mouth daily. 30 tablet 0  . atorvastatin (LIPITOR) 20 MG tablet Take 1 tablet (20 mg total) by mouth daily. 90 tablet 3  . Cholecalciferol (VITAMIN D3) 2000 units capsule Take 1 capsule (2,000 Units total) by mouth daily. 100 capsule 3  . Cyanocobalamin (VITAMIN B-12) 1000 MCG SUBL Place 1 tablet (1,000 mcg total) under the tongue daily. 100 tablet 3  . escitalopram (LEXAPRO) 10 MG tablet Take 1 tablet (10 mg total) by mouth daily. 90 tablet 3  . fluticasone (FLONASE) 50 MCG/ACT nasal spray Use 2 spray(s) in each nostril once daily 16 g 5  . ibuprofen (ADVIL,MOTRIN) 200 MG tablet Take 200 mg by mouth every 6 (six) hours as needed for mild pain.     Marland Kitchen loratadine (CLARITIN) 10 MG tablet Take 1 tablet (10 mg total) by mouth daily. (Patient taking differently: Take 10 mg by mouth daily as needed for allergies or rhinitis.) 30 tablet 11  . metoprolol tartrate (LOPRESSOR) 25 MG tablet Take 1 tablet (25 mg total) by mouth 2 (two) times daily. 180 tablet 3  . Multiple Vitamins-Minerals (CENTRUM SILVER 50+WOMEN) TABS Take 1 tablet by mouth daily with breakfast.    . cefUROXime (CEFTIN) 250 MG tablet Take 1 tablet (250 mg total) by mouth 2 (two) times daily with a meal. (Patient not taking: Reported on 12/31/2020) 20 tablet 0   No facility-administered medications prior to visit.    ROS: Review of Systems  Constitutional: Negative for activity change, appetite change, chills, fatigue and unexpected weight change.  HENT: Positive for congestion, rhinorrhea and sinus pain. Negative for mouth sores and sinus pressure.   Eyes: Negative for visual disturbance.  Respiratory: Negative for cough  and chest tightness.   Gastrointestinal: Negative for abdominal pain and nausea.  Genitourinary: Negative for difficulty urinating, frequency and vaginal pain.  Musculoskeletal: Negative for back pain and gait problem.  Skin: Negative for pallor and rash.  Neurological: Negative for dizziness, tremors, weakness, numbness and headaches.  Psychiatric/Behavioral: Negative for confusion and sleep disturbance.    Objective:  BP 132/80 (BP Location: Left Arm)   Pulse (!) 55   Temp 97.8 F (36.6 C) (Oral)   Wt 122 lb 9.6 oz (55.6 kg)   SpO2 97%   BMI 20.40 kg/m   BP Readings from Last 3 Encounters:  12/31/20 132/80  09/09/20 (!) 150/90  08/09/19 (!) 143/80    Wt Readings from Last 3 Encounters:  12/31/20 122 lb 9.6 oz (55.6 kg)  09/09/20 125 lb (56.7 kg)  08/02/19 131 lb (59.4 kg)    Physical Exam Constitutional:      General: She is not in acute distress.    Appearance: She is well-developed.  HENT:     Head: Normocephalic.     Right Ear: External ear normal.     Left Ear: External ear normal.     Nose: Nose normal.     Mouth/Throat:     Mouth: Oropharynx is clear and moist.  Eyes:     General:        Right eye: No discharge.        Left eye: No discharge.  Conjunctiva/sclera: Conjunctivae normal.     Pupils: Pupils are equal, round, and reactive to light.  Neck:     Thyroid: No thyromegaly.     Vascular: No JVD.     Trachea: No tracheal deviation.  Cardiovascular:     Rate and Rhythm: Normal rate and regular rhythm.     Heart sounds: Normal heart sounds.  Pulmonary:     Effort: No respiratory distress.     Breath sounds: No stridor. No wheezing.  Abdominal:     General: Bowel sounds are normal. There is no distension.     Palpations: Abdomen is soft. There is no mass.     Tenderness: There is no abdominal tenderness. There is no guarding or rebound.  Musculoskeletal:        General: No tenderness or edema.     Cervical back: Normal range of motion and  neck supple.  Lymphadenopathy:     Cervical: No cervical adenopathy.  Skin:    Findings: No erythema or rash.  Neurological:     Mental Status: She is oriented to person, place, and time.     Cranial Nerves: No cranial nerve deficit.     Motor: No abnormal muscle tone.     Coordination: Coordination normal.     Deep Tendon Reflexes: Reflexes normal.  Psychiatric:        Mood and Affect: Mood and affect normal.        Behavior: Behavior normal.        Thought Content: Thought content normal.        Judgment: Judgment normal.     Lab Results  Component Value Date   WBC 10.1 08/09/2019   HGB 15.6 (H) 08/09/2019   HCT 46.0 08/09/2019   PLT 780 (H) 08/09/2019   GLUCOSE 118 (H) 08/09/2019   CHOL 144 08/31/2018   TRIG 91.0 08/31/2018   HDL 46.50 08/31/2018   LDLCALC 80 08/31/2018   ALT 15 08/09/2019   AST 20 08/09/2019   NA 143 08/09/2019   K 3.4 (L) 08/09/2019   CL 105 08/09/2019   CREATININE 0.90 08/09/2019   BUN 12 08/09/2019   CO2 25 08/09/2019   TSH 1.84 08/31/2018   INR 1.3 (H) 08/09/2019   HGBA1C 5.3 12/26/2016   MICROALBUR 0.98 08/29/2009    DG Chest 2 View  Result Date: 08/09/2019 CLINICAL DATA:  Chest pain EXAM: CHEST - 2 VIEW COMPARISON:  12/25/2016 FINDINGS: Cardiomediastinal contours are within normal limits. Pulmonary vascularity is unremarkable. No focal airspace consolidation, pleural effusion, or pneumothorax. IMPRESSION: No active cardiopulmonary disease. Electronically Signed   By: Duanne Guess M.D.   On: 08/09/2019 13:05   CT HEAD WO CONTRAST  Result Date: 08/09/2019 CLINICAL DATA:  Headaches EXAM: CT HEAD WITHOUT CONTRAST TECHNIQUE: Contiguous axial images were obtained from the base of the skull through the vertex without intravenous contrast. COMPARISON:  07/28/2016 FINDINGS: Brain: No evidence of acute infarction, hemorrhage, hydrocephalus, extra-axial collection or mass lesion/mass effect. There is been interval surgical resection of previously  seen sellar mass. Vascular: No hyperdense vessel or unexpected calcification. Skull: Normal. Negative for fracture or focal lesion. Sinuses/Orbits: Interval postsurgical changes noted of the sphenoid sinuses. Mucosal thickening noted within the bilateral maxillary sinuses and scattered within the ethmoid air cells. Orbital structures intact. Other: None. IMPRESSION: 1. No acute intracranial findings. 2. 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 3. Chronic paranasal sinus disease. Electronically Signed   By: Duanne Guess M.D.   On: 08/09/2019 13:02  Assessment & Plan:     Follow-up: No follow-ups on file.  Walker Kehr, MD

## 2020-12-31 NOTE — Assessment & Plan Note (Signed)
On B12 

## 2020-12-31 NOTE — Assessment & Plan Note (Signed)
Nl BP now 

## 2020-12-31 NOTE — Assessment & Plan Note (Signed)
On Vit D 

## 2020-12-31 NOTE — Assessment & Plan Note (Signed)
Start Omnicef 

## 2021-04-04 ENCOUNTER — Other Ambulatory Visit: Payer: Self-pay | Admitting: *Deleted

## 2021-04-04 MED ORDER — METOPROLOL TARTRATE 25 MG PO TABS
25.0000 mg | ORAL_TABLET | Freq: Two times a day (BID) | ORAL | 1 refills | Status: DC
Start: 2021-04-04 — End: 2021-11-13

## 2021-04-04 NOTE — Telephone Encounter (Signed)
Rec'd call from daughter stating mom ha losyt her BP meds and wanting to get another rx call into pharmacy. verified pt chart she is up to date sent rx to [place].Marland KitchenJohny Chess

## 2021-05-08 ENCOUNTER — Telehealth: Payer: Self-pay | Admitting: *Deleted

## 2021-05-08 NOTE — Telephone Encounter (Signed)
Rec'd msg from pt daughter Solmon Ice) stating "  Wanting to know if Plot can prescribe the sinus meds that he had for my mom. Zyaire husband is on his dying bed so she did not want to leave him ". pls advise.Marland KitchenJohny Chess

## 2021-05-09 NOTE — Telephone Encounter (Signed)
I am sorry.  I am not sure I understand what she needs.  She can use Flonase 2 spray each nostril once a day and loratadine 10 mg daily.  Thanks

## 2021-05-09 NOTE — Telephone Encounter (Signed)
Notified daughter Solmon Ice ) w/MD response.Marland KitchenJohny Chess

## 2021-05-12 ENCOUNTER — Other Ambulatory Visit: Payer: Self-pay | Admitting: *Deleted

## 2021-05-12 MED ORDER — FLUTICASONE PROPIONATE 50 MCG/ACT NA SUSP: NASAL | 5 refills | Status: DC

## 2021-05-12 MED ORDER — LORATADINE 10 MG PO TABS: 10.0000 mg | ORAL_TABLET | Freq: Every day | ORAL | 1 refills | Status: DC | PRN

## 2021-08-25 ENCOUNTER — Telehealth: Payer: Self-pay | Admitting: *Deleted

## 2021-08-25 NOTE — Telephone Encounter (Signed)
I tried to reach patient by phone to scheduled her AWV. No answer or voicemail.

## 2021-10-30 ENCOUNTER — Ambulatory Visit: Payer: Medicare Other | Admitting: Internal Medicine

## 2021-11-13 ENCOUNTER — Other Ambulatory Visit: Payer: Self-pay

## 2021-11-13 ENCOUNTER — Encounter: Payer: Self-pay | Admitting: Internal Medicine

## 2021-11-13 ENCOUNTER — Ambulatory Visit (INDEPENDENT_AMBULATORY_CARE_PROVIDER_SITE_OTHER): Payer: Medicare Other | Admitting: Internal Medicine

## 2021-11-13 VITALS — BP 141/80 | HR 70 | Temp 97.8°F

## 2021-11-13 DIAGNOSIS — Z23 Encounter for immunization: Secondary | ICD-10-CM

## 2021-11-13 DIAGNOSIS — R519 Headache, unspecified: Secondary | ICD-10-CM | POA: Insufficient documentation

## 2021-11-13 DIAGNOSIS — Z91199 Patient's noncompliance with other medical treatment and regimen due to unspecified reason: Secondary | ICD-10-CM | POA: Diagnosis not present

## 2021-11-13 DIAGNOSIS — E559 Vitamin D deficiency, unspecified: Secondary | ICD-10-CM | POA: Diagnosis not present

## 2021-11-13 DIAGNOSIS — G4452 New daily persistent headache (NDPH): Secondary | ICD-10-CM | POA: Diagnosis not present

## 2021-11-13 DIAGNOSIS — F4321 Adjustment disorder with depressed mood: Secondary | ICD-10-CM

## 2021-11-13 DIAGNOSIS — R413 Other amnesia: Secondary | ICD-10-CM | POA: Diagnosis not present

## 2021-11-13 DIAGNOSIS — E538 Deficiency of other specified B group vitamins: Secondary | ICD-10-CM | POA: Diagnosis not present

## 2021-11-13 LAB — COMPREHENSIVE METABOLIC PANEL
ALT: 8 U/L (ref 0–35)
AST: 18 U/L (ref 0–37)
Albumin: 4.3 g/dL (ref 3.5–5.2)
Alkaline Phosphatase: 57 U/L (ref 39–117)
BUN: 14 mg/dL (ref 6–23)
CO2: 32 mEq/L (ref 19–32)
Calcium: 9.7 mg/dL (ref 8.4–10.5)
Chloride: 103 mEq/L (ref 96–112)
Creatinine, Ser: 0.86 mg/dL (ref 0.40–1.20)
GFR: 68.85 mL/min (ref >60.00)
Glucose, Bld: 68 mg/dL — ABNORMAL LOW (ref 70–99)
Potassium: 3.9 mEq/L (ref 3.5–5.1)
Sodium: 143 mEq/L (ref 135–145)
Total Bilirubin: 0.4 mg/dL (ref 0.2–1.2)
Total Protein: 7.1 g/dL (ref 6.0–8.3)

## 2021-11-13 LAB — CBC WITH DIFFERENTIAL/PLATELET
Basophils Absolute: 0.1 10*3/uL (ref 0.0–0.1)
Basophils Relative: 0.6 % (ref 0.0–3.0)
Eosinophils Absolute: 2.5 10*3/uL — ABNORMAL HIGH (ref 0.0–0.7)
Eosinophils Relative: 20.2 % — ABNORMAL HIGH (ref 0.0–5.0)
HCT: 44.7 % (ref 36.0–46.0)
Hemoglobin: 14.5 g/dL (ref 12.0–15.0)
Lymphocytes Relative: 9.8 % — ABNORMAL LOW (ref 12.0–46.0)
Lymphs Abs: 1.2 10*3/uL (ref 0.7–4.0)
MCHC: 32.5 g/dL (ref 30.0–36.0)
MCV: 79.1 fl (ref 78.0–100.0)
Monocytes Absolute: 0.6 10*3/uL (ref 0.1–1.0)
Monocytes Relative: 4.6 % (ref 3.0–12.0)
Neutro Abs: 8.1 10*3/uL — ABNORMAL HIGH (ref 1.4–7.7)
Neutrophils Relative %: 64.8 % (ref 43.0–77.0)
Platelets: 852 10*3/uL — ABNORMAL HIGH (ref 150.0–400.0)
RBC: 5.65 Mil/uL — ABNORMAL HIGH (ref 3.87–5.11)
RDW: 14.1 % (ref 11.5–15.5)
WBC: 12.5 10*3/uL — ABNORMAL HIGH (ref 4.0–10.5)

## 2021-11-13 LAB — URINALYSIS, ROUTINE W REFLEX MICROSCOPIC
Bilirubin Urine: NEGATIVE
Hgb urine dipstick: NEGATIVE
Ketones, ur: NEGATIVE
Leukocytes,Ua: NEGATIVE
Nitrite: POSITIVE — AB
RBC / HPF: NONE SEEN (ref 0–?)
Specific Gravity, Urine: 1.01 (ref 1.000–1.030)
Total Protein, Urine: NEGATIVE
Urine Glucose: NEGATIVE
Urobilinogen, UA: 0.2 (ref 0.0–1.0)
pH: 6.5 (ref 5.0–8.0)

## 2021-11-13 LAB — LIPID PANEL
Cholesterol: 190 mg/dL (ref 0–200)
HDL: 58.1 mg/dL (ref >39.00)
LDL Cholesterol: 116 mg/dL — ABNORMAL HIGH (ref 0–99)
NonHDL: 131.65
Total CHOL/HDL Ratio: 3
Triglycerides: 77 mg/dL (ref 0.0–149.0)
VLDL: 15.4 mg/dL (ref 0.0–40.0)

## 2021-11-13 LAB — VITAMIN D 25 HYDROXY (VIT D DEFICIENCY, FRACTURES): VITD: 37.86 ng/mL (ref 30.00–100.00)

## 2021-11-13 LAB — TSH: TSH: 2.31 u[IU]/mL (ref 0.35–5.50)

## 2021-11-13 LAB — VITAMIN B12: Vitamin B-12: 603 pg/mL (ref 211–911)

## 2021-11-13 MED ORDER — VITAMIN B-12 1000 MCG SL SUBL: 1.0000 | SUBLINGUAL_TABLET | Freq: Every day | SUBLINGUAL | 3 refills | Status: DC

## 2021-11-13 MED ORDER — VITAMIN D3 50 MCG (2000 UT) PO CAPS
2000.0000 [IU] | ORAL_CAPSULE | Freq: Every day | ORAL | 3 refills | Status: DC
Start: 2021-11-13 — End: 2022-11-10

## 2021-11-13 MED ORDER — AMOXICILLIN 875 MG PO TABS: 875.0000 mg | ORAL_TABLET | Freq: Two times a day (BID) | ORAL | 0 refills | Status: AC

## 2021-11-13 MED ORDER — LORATADINE 10 MG PO TABS
10.0000 mg | ORAL_TABLET | Freq: Every day | ORAL | 3 refills | Status: DC | PRN
Start: 2021-11-13 — End: 2022-10-15

## 2021-11-13 MED ORDER — ATORVASTATIN CALCIUM 20 MG PO TABS: 20.0000 mg | ORAL_TABLET | Freq: Every day | ORAL | 3 refills | Status: DC

## 2021-11-13 MED ORDER — METOPROLOL TARTRATE 25 MG PO TABS: 25.0000 mg | ORAL_TABLET | Freq: Two times a day (BID) | ORAL | 3 refills | Status: DC

## 2021-11-13 MED ORDER — ASPIRIN 81 MG PO CHEW
81.0000 mg | CHEWABLE_TABLET | Freq: Every day | ORAL | 0 refills | Status: DC
Start: 2021-11-13 — End: 2021-11-14

## 2021-11-13 MED ORDER — DONEPEZIL HCL 5 MG PO TABS
5.0000 mg | ORAL_TABLET | Freq: Every day | ORAL | 3 refills | Status: DC
Start: 2021-11-13 — End: 2022-08-14

## 2021-11-13 MED ORDER — ESCITALOPRAM OXALATE 10 MG PO TABS: 10.0000 mg | ORAL_TABLET | Freq: Every day | ORAL | 3 refills | Status: DC

## 2021-11-13 NOTE — Patient Instructions (Signed)
Re-start all meds one by one, not all at once

## 2021-11-13 NOTE — Assessment & Plan Note (Signed)
Treat depression Re- start Rx Start Aricept Neurology ref Head CT

## 2021-11-13 NOTE — Assessment & Plan Note (Signed)
Multifactorial - re-start all meds one by one, not all at once

## 2021-11-13 NOTE — Progress Notes (Signed)
Subjective:  Patient ID: Janet Sherman, female    DOB: Sep 08, 1951  Age: 70 y.o. MRN: [id number]  CC: Sinusitis (? Sinus infection.. having sinus drainage & pressure) and Hand Pain ((R) hand nerve pain)   HPI Chrissa Sherman presents for sinusitis, HA - green d/c C/o R hand/arm pain. No abd pain. Pt ran out of all meds   Per dtr Janet Sherman: "she has been having issues with her right side of her body such as hurting which was around last month. I also wanted to address that she has been very forgetful such as repeating the same thing and asking the same questions. She will also have something and 5 min later forget where she has left it at times. Sometimes she will not have this issues, but most days we experience these things."  Outpatient Medications Prior to Visit  Medication Sig Dispense Refill   aspirin 81 MG chewable tablet Chew 1 tablet (81 mg total) by mouth daily. 30 tablet 0   atorvastatin (LIPITOR) 20 MG tablet Take 1 tablet (20 mg total) by mouth daily. 90 tablet 3   Cholecalciferol (VITAMIN D3) 2000 units capsule Take 1 capsule (2,000 Units total) by mouth daily. 100 capsule 3   Cyanocobalamin (VITAMIN B-12) 1000 MCG SUBL Place 1 tablet (1,000 mcg total) under the tongue daily. 100 tablet 3   escitalopram (LEXAPRO) 10 MG tablet Take 1 tablet (10 mg total) by mouth daily. 90 tablet 3   fluticasone (FLONASE) 50 MCG/ACT nasal spray Use 2 spray(s) in each nostril once daily 16 g 5   ibuprofen (ADVIL,MOTRIN) 200 MG tablet Take 200 mg by mouth every 6 (six) hours as needed for mild pain.      loratadine (CLARITIN) 10 MG tablet Take 1 tablet (10 mg total) by mouth daily as needed for allergies or rhinitis. 90 tablet 1   metoprolol tartrate (LOPRESSOR) 25 MG tablet Take 1 tablet (25 mg total) by mouth 2 (two) times daily. 180 tablet 1   Multiple Vitamins-Minerals (CENTRUM SILVER 50+WOMEN) TABS Take 1 tablet by mouth daily with breakfast.     cefdinir (OMNICEF) 300 MG capsule Take 1 capsule (300  mg total) by mouth 2 (two) times daily. (Patient not taking: Reported on 11/13/2021) 20 capsule 0   No facility-administered medications prior to visit.    ROS: Review of Systems  Constitutional:  Negative for activity change, appetite change, chills, fatigue and unexpected weight change.  HENT:  Negative for congestion, mouth sores and sinus pressure.   Eyes:  Negative for visual disturbance.  Respiratory:  Negative for cough and chest tightness.   Gastrointestinal:  Negative for abdominal pain and nausea.  Genitourinary:  Negative for difficulty urinating, frequency and vaginal pain.  Musculoskeletal:  Positive for arthralgias and back pain. Negative for gait problem.  Skin:  Negative for pallor and rash.  Neurological:  Negative for dizziness, tremors, weakness, numbness and headaches.  Psychiatric/Behavioral:  Positive for decreased concentration. Negative for confusion, sleep disturbance and suicidal ideas. The patient is nervous/anxious.    Objective:  BP (!) 141/80 (BP Location: Left Arm)   Pulse 70   Temp 97.8 F (36.6 C) (Oral)   SpO2 94%   BP Readings from Last 3 Encounters:  11/13/21 (!) 141/80  12/31/20 132/80  09/09/20 (!) 150/90    Wt Readings from Last 3 Encounters:  12/31/20 122 lb 9.6 oz (55.6 kg)  09/09/20 125 lb (56.7 kg)  08/02/19 131 lb (59.4 kg)    Physical Exam Constitutional:  General: She is not in acute distress.    Appearance: She is well-developed.  HENT:     Head: Normocephalic.     Right Ear: External ear normal.     Left Ear: External ear normal.     Nose: Nose normal.  Eyes:     General:        Right eye: No discharge.        Left eye: No discharge.     Conjunctiva/sclera: Conjunctivae normal.     Pupils: Pupils are equal, round, and reactive to light.  Neck:     Thyroid: No thyromegaly.     Vascular: No JVD.     Trachea: No tracheal deviation.  Cardiovascular:     Rate and Rhythm: Normal rate and regular rhythm.     Heart  sounds: Normal heart sounds.  Pulmonary:     Effort: No respiratory distress.     Breath sounds: No stridor. No wheezing.  Abdominal:     General: Bowel sounds are normal. There is no distension.     Palpations: Abdomen is soft. There is no mass.     Tenderness: There is no abdominal tenderness. There is no guarding or rebound.  Musculoskeletal:        General: No tenderness.     Cervical back: Normal range of motion and neck supple. No rigidity.  Lymphadenopathy:     Cervical: No cervical adenopathy.  Skin:    Findings: No erythema or rash.  Neurological:     Cranial Nerves: No cranial nerve deficit.     Motor: No abnormal muscle tone.     Coordination: Coordination normal.     Deep Tendon Reflexes: Reflexes normal.  Psychiatric:        Behavior: Behavior normal.        Thought Content: Thought content normal.        Judgment: Judgment normal.  Aert , Oriented x2 Thin  Lab Results  Component Value Date   WBC 10.1 08/09/2019   HGB 15.6 (H) 08/09/2019   HCT 46.0 08/09/2019   PLT 780 (H) 08/09/2019   GLUCOSE 118 (H) 08/09/2019   CHOL 144 08/31/2018   TRIG 91.0 08/31/2018   HDL 46.50 08/31/2018   LDLCALC 80 08/31/2018   ALT 15 08/09/2019   AST 20 08/09/2019   NA 143 08/09/2019   K 3.4 (L) 08/09/2019   CL 105 08/09/2019   CREATININE 0.90 08/09/2019   BUN 12 08/09/2019   CO2 25 08/09/2019   TSH 1.84 08/31/2018   INR 1.3 (H) 08/09/2019   HGBA1C 5.3 12/26/2016   MICROALBUR 0.98 08/29/2009    DG Chest 2 View  Result Date: 08/09/2019 CLINICAL DATA:  Chest pain EXAM: CHEST - 2 VIEW COMPARISON:  12/25/2016 FINDINGS: Cardiomediastinal contours are within normal limits. Pulmonary vascularity is unremarkable. No focal airspace consolidation, pleural effusion, or pneumothorax. IMPRESSION: No active cardiopulmonary disease. Electronically Signed   By: Davina Poke M.D.   On: 08/09/2019 13:05   CT HEAD WO CONTRAST  Result Date: 08/09/2019 CLINICAL DATA:  Headaches EXAM:  CT HEAD WITHOUT CONTRAST TECHNIQUE: Contiguous axial images were obtained from the base of the skull through the vertex without intravenous contrast. COMPARISON:  07/28/2016 FINDINGS: Brain: No evidence of acute infarction, hemorrhage, hydrocephalus, extra-axial collection or mass lesion/mass effect. 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 Vascular: No hyperdense vessel or unexpected calcification. Skull: Normal. Negative for fracture or focal lesion. Sinuses/Orbits: Interval postsurgical changes noted of the sphenoid  sinuses. Mucosal thickening noted within the bilateral maxillary sinuses and scattered within the ethmoid air cells. Orbital structures intact. Other: None. IMPRESSION: 1. No acute intracranial findings. 2. 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 3. Chronic paranasal sinus disease. Electronically Signed   By: Davina Poke M.D.   On: 08/09/2019 13:02    Assessment & Plan:   Problem List Items Addressed This Visit     B12 deficiency    Check B12 Risks associated with treatment noncompliance were discussed. Compliance was encouraged. Re-start B12      Headache    Head CT Treat sinusitis      Memory loss or impairment    Treat depression Re- start Rx Start Aricept Neurology ref Head CT      Situational depression    Ran out of Rx Risks associated with treatment noncompliance were discussed. Compliance was encouraged.      Vitamin D deficiency    Risks associated with treatment noncompliance were discussed. Compliance was encouraged. Re-start Vit D         No orders of the defined types were placed in this encounter.     Follow-up: No follow-ups on file.  Walker Kehr, MD

## 2021-11-13 NOTE — Assessment & Plan Note (Signed)
Head CT Treat sinusitis

## 2021-11-13 NOTE — Assessment & Plan Note (Signed)
Ran out of Rx Risks associated with treatment noncompliance were discussed. Compliance was encouraged.

## 2021-11-13 NOTE — Assessment & Plan Note (Signed)
Check B12 Risks associated with treatment noncompliance were discussed. Compliance was encouraged. Re-start B12

## 2021-11-13 NOTE — Assessment & Plan Note (Signed)
Risks associated with treatment noncompliance were discussed. Compliance was encouraged. Re-start Vit D

## 2021-11-14 ENCOUNTER — Other Ambulatory Visit: Payer: Self-pay | Admitting: Internal Medicine

## 2021-11-14 MED ORDER — CEPHALEXIN 500 MG PO CAPS: 1000.0000 mg | ORAL_CAPSULE | Freq: Two times a day (BID) | ORAL | 0 refills | Status: DC

## 2021-11-14 MED ORDER — ASPIRIN 81 MG PO CHEW: 81.0000 mg | CHEWABLE_TABLET | Freq: Two times a day (BID) | ORAL | 0 refills | Status: DC

## 2022-02-19 NOTE — Progress Notes (Signed)
Letter not sent to patient due to memory impairment.

## 2022-05-20 ENCOUNTER — Telehealth: Payer: Self-pay | Admitting: Internal Medicine

## 2022-05-20 NOTE — Telephone Encounter (Signed)
Left message for patient to call back to schedule Medicare Annual Wellness Visit   Last AWV  07/08/18  Please schedule at anytime with [facility] if patient calls the office back.      Any questions, please call me at [phone]

## 2022-07-01 ENCOUNTER — Encounter: Payer: Self-pay | Admitting: Internal Medicine

## 2022-07-01 ENCOUNTER — Other Ambulatory Visit (INDEPENDENT_AMBULATORY_CARE_PROVIDER_SITE_OTHER): Payer: Medicare Other

## 2022-07-01 ENCOUNTER — Ambulatory Visit (INDEPENDENT_AMBULATORY_CARE_PROVIDER_SITE_OTHER): Payer: Medicare Other | Admitting: Internal Medicine

## 2022-07-01 VITALS — BP 140/82 | HR 78 | Temp 98.3°F | Ht 65.0 in | Wt 112.0 lb

## 2022-07-01 DIAGNOSIS — I10 Essential (primary) hypertension: Secondary | ICD-10-CM | POA: Diagnosis not present

## 2022-07-01 DIAGNOSIS — R202 Paresthesia of skin: Secondary | ICD-10-CM

## 2022-07-01 DIAGNOSIS — R413 Other amnesia: Secondary | ICD-10-CM

## 2022-07-01 DIAGNOSIS — E538 Deficiency of other specified B group vitamins: Secondary | ICD-10-CM

## 2022-07-01 DIAGNOSIS — D75839 Thrombocytosis, unspecified: Secondary | ICD-10-CM

## 2022-07-01 DIAGNOSIS — Z87891 Personal history of nicotine dependence: Secondary | ICD-10-CM

## 2022-07-01 LAB — CBC WITH DIFFERENTIAL/PLATELET
Basophils Absolute: 0 10*3/uL (ref 0.0–0.1)
Basophils Relative: 0.2 % (ref 0.0–3.0)
Eosinophils Absolute: 0.4 10*3/uL (ref 0.0–0.7)
Eosinophils Relative: 4.3 % (ref 0.0–5.0)
HCT: 41.5 % (ref 36.0–46.0)
Hemoglobin: 13.2 g/dL (ref 12.0–15.0)
Lymphocytes Relative: 13.2 % (ref 12.0–46.0)
Lymphs Abs: 1.3 10*3/uL (ref 0.7–4.0)
MCHC: 31.8 g/dL (ref 30.0–36.0)
MCV: 79.5 fl (ref 78.0–100.0)
Monocytes Absolute: 0.6 10*3/uL (ref 0.1–1.0)
Monocytes Relative: 6.2 % (ref 3.0–12.0)
Neutro Abs: 7.7 10*3/uL (ref 1.4–7.7)
Neutrophils Relative %: 76.1 % (ref 43.0–77.0)
Platelets: 817 10*3/uL — ABNORMAL HIGH (ref 150.0–400.0)
RBC: 5.22 Mil/uL — ABNORMAL HIGH (ref 3.87–5.11)
RDW: 14.9 % (ref 11.5–15.5)
WBC: 10.2 10*3/uL (ref 4.0–10.5)

## 2022-07-01 LAB — URIC ACID: Uric Acid, Serum: 4.9 mg/dL (ref 2.4–7.0)

## 2022-07-01 LAB — URINALYSIS, ROUTINE W REFLEX MICROSCOPIC
Bilirubin Urine: NEGATIVE
Hgb urine dipstick: NEGATIVE
Ketones, ur: NEGATIVE
Nitrite: NEGATIVE
RBC / HPF: NONE SEEN (ref 0–?)
Specific Gravity, Urine: 1.01 (ref 1.000–1.030)
Total Protein, Urine: NEGATIVE
Urine Glucose: NEGATIVE
Urobilinogen, UA: 0.2 (ref 0.0–1.0)
pH: 7 (ref 5.0–8.0)

## 2022-07-01 LAB — COMPREHENSIVE METABOLIC PANEL
ALT: 9 U/L (ref 0–35)
AST: 15 U/L (ref 0–37)
Albumin: 4.3 g/dL (ref 3.5–5.2)
Alkaline Phosphatase: 51 U/L (ref 39–117)
BUN: 14 mg/dL (ref 6–23)
CO2: 29 mEq/L (ref 19–32)
Calcium: 9.6 mg/dL (ref 8.4–10.5)
Chloride: 105 mEq/L (ref 96–112)
Creatinine, Ser: 0.94 mg/dL (ref 0.40–1.20)
GFR: 61.61 mL/min (ref >60.00)
Glucose, Bld: 81 mg/dL (ref 70–99)
Potassium: 3.6 mEq/L (ref 3.5–5.1)
Sodium: 141 mEq/L (ref 135–145)
Total Bilirubin: 0.3 mg/dL (ref 0.2–1.2)
Total Protein: 6.9 g/dL (ref 6.0–8.3)

## 2022-07-01 LAB — HEPATIC FUNCTION PANEL
ALT: 9 U/L (ref 0–35)
AST: 15 U/L (ref 0–37)
Albumin: 4.3 g/dL (ref 3.5–5.2)
Alkaline Phosphatase: 51 U/L (ref 39–117)
Bilirubin, Direct: 0.1 mg/dL (ref 0.0–0.3)
Total Bilirubin: 0.3 mg/dL (ref 0.2–1.2)
Total Protein: 6.9 g/dL (ref 6.0–8.3)

## 2022-07-01 LAB — LIPID PANEL
Cholesterol: 211 mg/dL — ABNORMAL HIGH (ref 0–200)
HDL: 57 mg/dL (ref >39.00)
NonHDL: 154.34
Total CHOL/HDL Ratio: 4
Triglycerides: 217 mg/dL — ABNORMAL HIGH (ref 0.0–149.0)
VLDL: 43.4 mg/dL — ABNORMAL HIGH (ref 0.0–40.0)

## 2022-07-01 LAB — TSH: TSH: 2.2 u[IU]/mL (ref 0.35–5.50)

## 2022-07-01 LAB — LDL CHOLESTEROL, DIRECT: Direct LDL: 112 mg/dL

## 2022-07-01 NOTE — Assessment & Plan Note (Signed)
Quit in 2017

## 2022-07-01 NOTE — Assessment & Plan Note (Signed)
Cont w/B12 

## 2022-07-01 NOTE — Assessment & Plan Note (Signed)
Worse Obtain head CT, labs Could be related to thrombocytosis: take ASA, hydrate well

## 2022-07-01 NOTE — Progress Notes (Signed)
Subjective:  Patient ID: Janet Sherman, female    DOB: Aug 15, 1951  Age: 71 y.o. MRN: [id number]  CC: No chief complaint on file.   HPI Dariela Stoker presents for dementia,  C/o pain paresthesia on the R arm, R leg and foot - 3 weeks R toes were swollen Husband died 2 wks ago  Not taking some of her meds    Outpatient Medications Prior to Visit  Medication Sig Dispense Refill   aspirin 81 MG chewable tablet Chew 1 tablet (81 mg total) by mouth 2 (two) times daily. 100 tablet 0   donepezil (ARICEPT) 5 MG tablet Take 1 tablet (5 mg total) by mouth at bedtime. 90 tablet 3   metoprolol tartrate (LOPRESSOR) 25 MG tablet Take 1 tablet (25 mg total) by mouth 2 (two) times daily. 180 tablet 3   Cholecalciferol (VITAMIN D3) 50 MCG (2000 UT) capsule Take 1 capsule (2,000 Units total) by mouth daily. (Patient not taking: Reported on 07/01/2022) 100 capsule 3   fluticasone (FLONASE) 50 MCG/ACT nasal spray Use 2 spray(s) in each nostril once daily (Patient not taking: Reported on 07/01/2022) 16 g 5   ibuprofen (ADVIL,MOTRIN) 200 MG tablet Take 200 mg by mouth every 6 (six) hours as needed for mild pain.  (Patient not taking: Reported on 07/01/2022)     loratadine (CLARITIN) 10 MG tablet Take 1 tablet (10 mg total) by mouth daily as needed for allergies or rhinitis. (Patient not taking: Reported on 07/01/2022) 90 tablet 3   Multiple Vitamins-Minerals (CENTRUM SILVER 50+WOMEN) TABS Take 1 tablet by mouth daily with breakfast. (Patient not taking: Reported on 07/01/2022)     atorvastatin (LIPITOR) 20 MG tablet Take 1 tablet (20 mg total) by mouth daily. (Patient not taking: Reported on 07/01/2022) 90 tablet 3   cephALEXin (KEFLEX) 500 MG capsule Take 2 capsules (1,000 mg total) by mouth 2 (two) times daily. 20 capsule 0   Cyanocobalamin (VITAMIN B-12) 1000 MCG SUBL Place 1 tablet (1,000 mcg total) under the tongue daily. (Patient not taking: Reported on 07/01/2022) 100 tablet 3   escitalopram (LEXAPRO) 10 MG tablet  Take 1 tablet (10 mg total) by mouth daily. (Patient not taking: Reported on 07/01/2022) 90 tablet 3   No facility-administered medications prior to visit.    ROS: Review of Systems  Constitutional:  Negative for activity change, appetite change, chills, fatigue and unexpected weight change.  HENT:  Negative for congestion, mouth sores and sinus pressure.   Eyes:  Negative for visual disturbance.  Respiratory:  Negative for cough and chest tightness.   Gastrointestinal:  Negative for abdominal pain and nausea.  Genitourinary:  Negative for difficulty urinating, frequency and vaginal pain.  Musculoskeletal:  Negative for back pain and gait problem.  Skin:  Negative for pallor and rash.  Neurological:  Positive for numbness. Negative for dizziness, tremors, weakness and headaches.  Psychiatric/Behavioral:  Negative for confusion and sleep disturbance.     Objective:  BP 140/82 (BP Location: Left Arm, Patient Position: Sitting, Cuff Size: Normal)   Pulse 78   Temp 98.3 F (36.8 C) (Oral)   Ht '5\' 5"'$  (1.651 m)   Wt 112 lb (50.8 kg)   SpO2 95%   BMI 18.64 kg/m   BP Readings from Last 3 Encounters:  07/01/22 140/82  11/13/21 (!) 141/80  12/31/20 132/80    Wt Readings from Last 3 Encounters:  07/01/22 112 lb (50.8 kg)  12/31/20 122 lb 9.6 oz (55.6 kg)  09/09/20 125 lb (56.7 kg)  Physical Exam Constitutional:      General: She is not in acute distress.    Appearance: Normal appearance. She is well-developed.  HENT:     Head: Normocephalic.     Right Ear: External ear normal.     Left Ear: External ear normal.     Nose: Nose normal.  Eyes:     General:        Right eye: No discharge.        Left eye: No discharge.     Conjunctiva/sclera: Conjunctivae normal.     Pupils: Pupils are equal, round, and reactive to light.  Neck:     Thyroid: No thyromegaly.     Vascular: No JVD.     Trachea: No tracheal deviation.  Cardiovascular:     Rate and Rhythm: Normal rate and  regular rhythm.     Heart sounds: Normal heart sounds.  Pulmonary:     Effort: No respiratory distress.     Breath sounds: No stridor. No wheezing.  Abdominal:     General: Bowel sounds are normal. There is no distension.     Palpations: Abdomen is soft. There is no mass.     Tenderness: There is no abdominal tenderness. There is no guarding or rebound.  Musculoskeletal:        General: No tenderness.     Cervical back: Normal range of motion and neck supple. No rigidity.  Lymphadenopathy:     Cervical: No cervical adenopathy.  Skin:    Findings: No erythema or rash.  Neurological:     Cranial Nerves: No cranial nerve deficit.     Motor: No abnormal muscle tone.     Coordination: Coordination normal.     Gait: Gait normal.     Deep Tendon Reflexes: Reflexes normal.  Psychiatric:        Thought Content: Thought content normal.     Lab Results  Component Value Date   WBC 12.5 (H) 11/13/2021   HGB 14.5 11/13/2021   HCT 44.7 11/13/2021   PLT 852.0 (H) 11/13/2021   GLUCOSE 68 (L) 11/13/2021   CHOL 190 11/13/2021   TRIG 77.0 11/13/2021   HDL 58.10 11/13/2021   LDLCALC 116 (H) 11/13/2021   ALT 8 11/13/2021   AST 18 11/13/2021   NA 143 11/13/2021   K 3.9 11/13/2021   CL 103 11/13/2021   CREATININE 0.86 11/13/2021   BUN 14 11/13/2021   CO2 32 11/13/2021   TSH 2.31 11/13/2021   INR 1.3 (H) 08/09/2019   HGBA1C 5.3 12/26/2016   MICROALBUR 0.98 08/29/2009    DG Chest 2 View  Result Date: 08/09/2019 CLINICAL DATA:  Chest pain EXAM: CHEST - 2 VIEW COMPARISON:  12/25/2016 FINDINGS: Cardiomediastinal contours are within normal limits. Pulmonary vascularity is unremarkable. No focal airspace consolidation, pleural effusion, or pneumothorax. IMPRESSION: No active cardiopulmonary disease. Electronically Signed   By: Davina Poke M.D.   On: 08/09/2019 13:05   CT HEAD WO CONTRAST  Result Date: 08/09/2019 CLINICAL DATA:  Headaches EXAM: CT HEAD WITHOUT CONTRAST TECHNIQUE:  Contiguous axial images were obtained from the base of the skull through the vertex without intravenous contrast. COMPARISON:  07/28/2016 FINDINGS: Brain: No evidence of acute infarction, hemorrhage, hydrocephalus, extra-axial collection or mass lesion/mass effect. 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 Vascular: No hyperdense vessel or unexpected calcification. Skull: Normal. Negative for fracture or focal lesion. Sinuses/Orbits: Interval postsurgical changes noted of the sphenoid sinuses. Mucosal thickening noted within the  bilateral maxillary sinuses and scattered within the ethmoid air cells. Orbital structures intact. Other: None. IMPRESSION: 1. No acute intracranial findings. 2. 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 3. Chronic paranasal sinus disease. Electronically Signed   By: Davina Poke M.D.   On: 08/09/2019 13:02    Assessment & Plan:   Problem List Items Addressed This Visit     B12 deficiency    Cont w/B12      History of tobacco use    Quit in 2017      Memory loss or impairment   Relevant Orders   CT HEAD WO CONTRAST (5MM)   Paresthesias - Primary    Worse Obtain head CT, labs Could be related to thrombocytosis: take ASA, hydrate well       Relevant Orders   TSH   CBC with Differential/Platelet   Lipid panel   Urinalysis   Hepatic function panel   Comprehensive metabolic panel   Uric acid   CT HEAD WO CONTRAST (5MM)   Primary hypertension   Relevant Orders   TSH   CBC with Differential/Platelet   Lipid panel   Urinalysis   Hepatic function panel   Comprehensive metabolic panel   Uric acid   Other Visit Diagnoses     Hypertension, unspecified type       Relevant Orders   CT HEAD WO CONTRAST (5MM)         No orders of the defined types were placed in this encounter.     Follow-up: Return in about 6 weeks (around 08/12/2022) for a follow-up visit.  Walker Kehr, MD

## 2022-07-02 DIAGNOSIS — D473 Essential (hemorrhagic) thrombocythemia: Secondary | ICD-10-CM | POA: Insufficient documentation

## 2022-07-02 DIAGNOSIS — D75839 Thrombocytosis, unspecified: Secondary | ICD-10-CM | POA: Insufficient documentation

## 2022-07-02 NOTE — Assessment & Plan Note (Signed)
Not better. R/o MF etc Hem ref

## 2022-07-02 NOTE — Addendum Note (Signed)
Addended by: Cassandria Anger on: 07/02/2022 04:47 PM   Modules accepted: Orders

## 2022-07-03 ENCOUNTER — Telehealth: Payer: Self-pay | Admitting: Hematology and Oncology

## 2022-07-03 NOTE — Telephone Encounter (Signed)
Scheduled appt per 7/6 referral. Pt is aware of appt date and time. Pt is aware to arrive 15 mins prior to appt time and to bring and updated insurance card. Pt is aware of appt location.   

## 2022-07-27 ENCOUNTER — Inpatient Hospital Stay: Payer: Medicare Other | Attending: Hematology and Oncology | Admitting: Hematology and Oncology

## 2022-07-27 ENCOUNTER — Other Ambulatory Visit: Payer: Self-pay

## 2022-07-27 ENCOUNTER — Inpatient Hospital Stay: Payer: Medicare Other

## 2022-07-27 VITALS — BP 145/84 | HR 69 | Temp 98.0°F | Resp 15 | Wt 115.4 lb

## 2022-07-27 DIAGNOSIS — D75839 Thrombocytosis, unspecified: Secondary | ICD-10-CM

## 2022-07-27 DIAGNOSIS — I10 Essential (primary) hypertension: Secondary | ICD-10-CM | POA: Diagnosis not present

## 2022-07-27 DIAGNOSIS — M1990 Unspecified osteoarthritis, unspecified site: Secondary | ICD-10-CM | POA: Diagnosis not present

## 2022-07-27 LAB — CMP (CANCER CENTER ONLY)
ALT: 12 U/L (ref 0–44)
AST: 19 U/L (ref 15–41)
Albumin: 4.3 g/dL (ref 3.5–5.0)
Alkaline Phosphatase: 50 U/L (ref 38–126)
Anion gap: 4 — ABNORMAL LOW (ref 5–15)
BUN: 18 mg/dL (ref 8–23)
CO2: 31 mmol/L (ref 22–32)
Calcium: 9.7 mg/dL (ref 8.9–10.3)
Chloride: 106 mmol/L (ref 98–111)
Creatinine: 0.94 mg/dL (ref 0.44–1.00)
GFR, Estimated: >60 mL/min (ref >60)
Glucose, Bld: 98 mg/dL (ref 70–99)
Potassium: 4.1 mmol/L (ref 3.5–5.1)
Sodium: 141 mmol/L (ref 135–145)
Total Bilirubin: 0.4 mg/dL (ref 0.3–1.2)
Total Protein: 7.3 g/dL (ref 6.5–8.1)

## 2022-07-27 LAB — RETIC PANEL
Immature Retic Fract: 8.5 % (ref 2.3–15.9)
RBC.: 5.4 MIL/uL — ABNORMAL HIGH (ref 3.87–5.11)
Retic Count, Absolute: 64.8 10*3/uL (ref 19.0–186.0)
Retic Ct Pct: 1.2 % (ref 0.4–3.1)
Reticulocyte Hemoglobin: 28.7 pg (ref >27.9)

## 2022-07-27 LAB — IRON AND IRON BINDING CAPACITY (CC-WL,HP ONLY)
Iron: 62 ug/dL (ref 28–170)
Saturation Ratios: 17 % (ref 10.4–31.8)
TIBC: 374 ug/dL (ref 250–450)
UIBC: 312 ug/dL (ref 148–442)

## 2022-07-27 LAB — CBC WITH DIFFERENTIAL (CANCER CENTER ONLY)
Abs Immature Granulocytes: 0.04 10*3/uL (ref 0.00–0.07)
Basophils Absolute: 0.1 10*3/uL (ref 0.0–0.1)
Basophils Relative: 1 %
Eosinophils Absolute: 0.7 10*3/uL — ABNORMAL HIGH (ref 0.0–0.5)
Eosinophils Relative: 6 %
HCT: 42.7 % (ref 36.0–46.0)
Hemoglobin: 14.1 g/dL (ref 12.0–15.0)
Immature Granulocytes: 0 %
Lymphocytes Relative: 14 %
Lymphs Abs: 1.4 10*3/uL (ref 0.7–4.0)
MCH: 26 pg (ref 26.0–34.0)
MCHC: 33 g/dL (ref 30.0–36.0)
MCV: 78.6 fL — ABNORMAL LOW (ref 80.0–100.0)
Monocytes Absolute: 0.6 10*3/uL (ref 0.1–1.0)
Monocytes Relative: 6 %
Neutro Abs: 7.6 10*3/uL (ref 1.7–7.7)
Neutrophils Relative %: 73 %
Platelet Count: 820 10*3/uL — ABNORMAL HIGH (ref 150–400)
RBC: 5.43 MIL/uL — ABNORMAL HIGH (ref 3.87–5.11)
RDW: 13.5 % (ref 11.5–15.5)
WBC Count: 10.4 10*3/uL (ref 4.0–10.5)
nRBC: 0 % (ref 0.0–0.2)

## 2022-07-27 LAB — SEDIMENTATION RATE: Sed Rate: 0 mm/hr (ref 0–22)

## 2022-07-27 LAB — C-REACTIVE PROTEIN: CRP: 0.9 mg/dL (ref <1.0)

## 2022-07-27 NOTE — Progress Notes (Signed)
[place] Telephone:(336) [phone]   Fax:([phone]) [place] NOTE  Patient Care Team: Cassandria Anger, MD as PCP - General (Internal Medicine)  Hematological/Oncological History # Thrombocytosis 11/13/2021: WBC 12.5, Hgb 14.5, MCV 79.1, Plt 852 07/01/2022: WBC 10.2, Hgb 13.2, MCV 79.5, Plt 817 07/27/2022: establish care with Dr. Lorenso Courier   CHIEF COMPLAINTS/PURPOSE OF CONSULTATION:  "Thrombocytosis "  HISTORY OF PRESENTING ILLNESS:  Janet Sherman 71 y.o. female with medical history significant for arthritis, hypertension, and migraine who presents for evaluation of thrombocytosis.  On review of the previous records Ms. Janet Sherman has had a thrombocytosis dating back to at least 07/30/2007.  At that time patient had a platelet count of 467.  The patient did have some normal platelet counts in 2009 and 2010, ranging in the high 300s.  Since 2018 the patient's platelet count has been in the 600s or higher.  Most recently on 11/13/2021 the patient had platelets of 852 and on 07/01/2022 the patient had platelets of 817.  Throughout this time the patient typically has normal hemoglobin and white blood cell levels.  Due to concern for this thrombocytosis the patient was referred to hematology for further evaluation and management.  On exam today Janet Sherman reports that she has had no recent issues with bleeding.  She notes that she heals quickly.  She reports that the platelet issue is "not new".  She has never undergone splenectomy.  She does not she has difficulty with arthritis but no history of lupus or rheumatoid arthritis.  She reports she went through menopause but she is unsure which age.  Of note the patient is a poor historian and has known cognitive decline.  Her family history is markable for hypertension in her mother and prostate cancer in her father.  She has 5 children all of whom are healthy.  She is a former smoker and quit "long ago".  She is not sure exactly  when.  She does not drink any alcohol and previously worked as a Training and development officer at [employer]  She notes that she is otherwise healthy.  Her appetite is good and her weight has been stable.  She is aware she suffers from forgetfulness.  She otherwise denies any fevers, chills, sweats, nausea, vomiting or diarrhea.  A full 10 point ROS is listed below.  MEDICAL HISTORY:  Past Medical History:  Diagnosis Date   Arthritis    "right hand" (01/22/2017)   Gallstones    History of kidney stones    Hypertension    Migraine    "a few/year" (01/22/2017)   Pneumonia X 1    SURGICAL HISTORY: Past Surgical History:  Procedure Laterality Date   CRANIOTOMY N/A 10/16/2016   Procedure: Pituitary tumor (benign) resection - CRANIOTOMY HYPOPHYSECTOMY TRANSNASAL APPROACH;  Surgeon: Consuella Lose, MD;  Location: [place];  Service: Neurosurgery;  Laterality: N/A;   ELECTROPHYSIOLOGIC STUDY N/A 01/22/2017   Procedure: SVT Ablation;  Surgeon: Will Meredith Leeds, MD;  Location: [facility];  Service: Cardiovascular;  Laterality: N/A;   ELECTROPHYSIOLOGIC STUDY N/A 01/22/2017   Procedure: Electrophysiology Study;  Surgeon: Will Meredith Leeds, MD;  Location: [facility];  Service: Cardiovascular;  Laterality: N/A;   gallstones  ~ 2000   KIDNEY STONE SURGERY     "cut me open"   SUPRAVENTRICULAR TACHYCARDIA ABLATION  01/22/2017   TRANSNASAL APPROACH N/A 10/16/2016   Procedure: TRANSNASAL APPROACH WITH FUSION;  Surgeon: Jerrell Belfast, MD;  Location: [place];  Service: ENT;  Laterality: N/A;   TUBAL LIGATION      SOCIAL HISTORY: Social History   Socioeconomic History   Marital status: Widowed    Spouse name: Jeneen Rinks   Number of children: 5   Years of education: 10   Highest education level: Not on file  Occupational History    Employer: [employer]    Comment: [employer]  Tobacco Use   Smoking status: Former    Packs/day: 1.00    Types: Cigarettes    Quit date:  10/16/2016    Years since quitting: 5.7   Smokeless tobacco: Never  Substance and Sexual Activity   Alcohol use: No   Drug use: No   Sexual activity: Never  Other Topics Concern   Not on file  Social History Narrative   Patient is married(James) and lives at home with her husband.   Patient has 5 children.   Patient works full-time.   Patient has a 10th  Grade education.   Patient is right handed.   Patient drinks 2-3 cups of coffee daily.   Social Determinants of Health   Financial Resource Strain: Not on file  Food Insecurity: Not on file  Transportation Needs: Not on file  Physical Activity: Not on file  Stress: Not on file  Social Connections: Not on file  Intimate Partner Violence: Not on file    FAMILY HISTORY: Family History  Problem Relation Age of Onset   Hypertension Mother    Cancer Father        lung   Hypertension Sister    Hypertension Brother     ALLERGIES:  is allergic to mold extract [trichophyton] and pollen extract.  MEDICATIONS:  Current Outpatient Medications  Medication Sig Dispense Refill   Cholecalciferol (VITAMIN D3) 50 MCG (2000 UT) capsule Take 1 capsule (2,000 Units total) by mouth daily. 100 capsule 3   fluticasone (FLONASE) 50 MCG/ACT nasal spray Use 2 spray(s) in each nostril once daily 16 g 5   ibuprofen (ADVIL,MOTRIN) 200 MG tablet Take 200 mg by mouth every 6 (six) hours as needed for mild pain.     loratadine (CLARITIN) 10 MG tablet Take 1 tablet (10 mg total) by mouth daily as needed for allergies or rhinitis. 90 tablet 3   Multiple Vitamins-Minerals (CENTRUM SILVER 50+WOMEN) TABS Take 1 tablet by mouth daily with breakfast.     aspirin 81 MG chewable tablet Chew 1 tablet (81 mg total) by mouth 2 (two) times daily. 100 tablet 0   donepezil (ARICEPT) 5 MG tablet Take 1 tablet (5 mg total) by mouth at bedtime. 90 tablet 3   metoprolol tartrate (LOPRESSOR) 25 MG tablet Take 1 tablet (25 mg total) by mouth 2 (two) times daily. 180  tablet 3   No current facility-administered medications for this visit.    REVIEW OF SYSTEMS:   Constitutional: ( - ) fevers, ( - )  chills , ( - ) night sweats Eyes: ( - ) blurriness of vision, ( - ) double vision, ( - ) watery eyes Ears, nose, mouth, throat, and face: ( - ) mucositis, ( - ) sore throat Respiratory: ( - ) cough, ( - ) dyspnea, ( - ) wheezes Cardiovascular: ( - ) palpitation, ( - ) chest discomfort, ( - ) lower extremity swelling Gastrointestinal:  ( - ) nausea, ( - ) heartburn, ( - ) change in bowel habits Skin: ( - ) abnormal skin rashes Lymphatics: ( - ) new lymphadenopathy, ( - ) easy  bruising Neurological: ( - ) numbness, ( - ) tingling, ( - ) new weaknesses Behavioral/Psych: ( - ) mood change, ( - ) new changes  All other systems were reviewed with the patient and are negative.  PHYSICAL EXAMINATION:  Vitals:   07/27/22 1406  BP: (!) 145/84  Pulse: 69  Resp: 15  Temp: 98 F (36.7 C)  SpO2: 97%   Filed Weights   07/27/22 1406  Weight: 115 lb 6.4 oz (52.3 kg)    GENERAL: well appearing elderly African-American female in NAD  SKIN: skin color, texture, turgor are normal, no rashes or significant lesions EYES: conjunctiva are pink and non-injected, sclera clear LUNGS: clear to auscultation and percussion with normal breathing effort HEART: regular rate & rhythm and no murmurs and no lower extremity edema Musculoskeletal: no cyanosis of digits and no clubbing  PSYCH: alert & oriented x 3, fluent speech NEURO: no focal motor/sensory deficits  LABORATORY DATA:  I have reviewed the data as listed    Latest Ref Rng & Units 07/27/2022    2:33 PM 07/01/2022    3:37 PM 11/13/2021    9:08 AM  CBC  WBC 4.0 - 10.5 K/uL 10.4  10.2  12.5   Hemoglobin 12.0 - 15.0 g/dL 14.1  13.2  14.5   Hematocrit 36.0 - 46.0 % 42.7  41.5  44.7   Platelets 150 - 400 K/uL 820  817.0  852.0        Latest Ref Rng & Units 07/27/2022    2:33 PM 07/01/2022    3:37 PM 11/13/2021     9:08 AM  CMP  Glucose 70 - 99 mg/dL 98  81  68   BUN 8 - 23 mg/dL _0 Creatinine 0.44 - 1.00 mg/dL 0.94  0.94  0.86   Sodium 135 - 145 mmol/L 141  141  143   Potassium 3.5 - 5.1 mmol/L 4.1  3.6  3.9   Chloride 98 - 111 mmol/L 106  105  103   CO2 22 - 32 mmol/L 31  29  32   Calcium 8.9 - 10.3 mg/dL 9.7  9.6  9.7   Total Protein 6.5 - 8.1 g/dL 7.3  6.9    6.9  7.1   Total Bilirubin 0.3 - 1.2 mg/dL 0.4  0.3    0.3  0.4   Alkaline Phos 38 - 126 U/L 50  51    51  57   AST 15 - 41 U/L _1 ALT 0 - 44 U/L _2 ASSESSMENT & PLAN Janet Sherman 72 y.o. female with medical history significant for arthritis, hypertension, and migraine who presents for evaluation of thrombocytosis.  After review of the labs, review of the records, and discussion with the patient the patients findings are most consistent with thrombocytosis of unclear etiology, suspect MPN.   There are two types of thrombocytosis, essential thrombocytosis and secondary thrombocytosis. Essential thrombocytosis is overproduction of platelets due to a driver mutation. The most common mutation is the JAK2 V617F (50% of cases), but other causes include CALR, MPL, and mutations of unclear significance on NGS. Essential thrombocytosis is a myeloproliferative neoplasm which may require cytoreductive therapy do decrease risk of thrombosis. Secondary thrombocytosis can be caused by low iron levels, increased inflammation,  or splenectomy.  Our workup will focus on determining if there is  a secondary cause of this patient's thrombocytosis.    #Thrombocytosis, likely 2/2 to MPN  --workup to include CBC, CMP, ESR/CRP, and iron panel/ferritin  --patient has no history of splenectomy     --will order MPN workup to include JAK2 with reflex and BCR/ABL FISH  --no indication for MPN workup at this time  --RTC in 6 months or sooner if indicated by the above labs.    Orders Placed This Encounter  Procedures    CBC with Differential ([facility] Only)    Standing Status:   Future    Number of Occurrences:   1    Standing Expiration Date:   07/28/2023   CMP ([facility] only)    Standing Status:   Future    Number of Occurrences:   1    Standing Expiration Date:   07/28/2023   Ferritin    Standing Status:   Future    Number of Occurrences:   1    Standing Expiration Date:   07/28/2023   Iron and Iron Binding Capacity ([facility] only)    Standing Status:   Future    Number of Occurrences:   1    Standing Expiration Date:   07/28/2023   Retic Panel    Standing Status:   Future    Number of Occurrences:   1    Standing Expiration Date:   07/28/2023   JAK2 (INCLUDING V617F AND EXON 12), MPL,& CALR W/RFL MPN PANEL (NGS)    Standing Status:   Future    Number of Occurrences:   1    Standing Expiration Date:   07/27/2023   BCR ABL1 FISH ([facility])    Standing Status:   Future    Number of Occurrences:   1    Standing Expiration Date:   07/28/2023   Sedimentation rate    Standing Status:   Future    Number of Occurrences:   1    Standing Expiration Date:   07/27/2023   C-reactive protein    Standing Status:   Future    Number of Occurrences:   1    Standing Expiration Date:   07/27/2023   All questions were answered. The patient knows to call the clinic with any problems, questions or concerns.  A total of more than 60 minutes were spent on this encounter with face-to-face time and non-face-to-face time, including preparing to see the patient, ordering tests and/or medications, counseling the patient and coordination of care as outlined above.   Ledell Peoples, MD Department of Hematology/Oncology [facility] Phone: [phone] Pager: [phone] Email: [email or web address]  07/27/2022 3:53 PM

## 2022-07-28 LAB — FERRITIN: Ferritin: 42 ng/mL (ref 11–307)

## 2022-07-29 ENCOUNTER — Other Ambulatory Visit: Payer: Medicare Other

## 2022-07-31 ENCOUNTER — Ambulatory Visit
Admission: RE | Admit: 2022-07-31 | Discharge: 2022-07-31 | Disposition: A | Discharge status: Home / Self Care | Payer: Medicare Other | Source: Ambulatory Visit | Attending: Internal Medicine | Admitting: Internal Medicine

## 2022-07-31 DIAGNOSIS — I10 Essential (primary) hypertension: Secondary | ICD-10-CM

## 2022-07-31 DIAGNOSIS — R202 Paresthesia of skin: Secondary | ICD-10-CM

## 2022-07-31 DIAGNOSIS — R413 Other amnesia: Secondary | ICD-10-CM

## 2022-08-03 ENCOUNTER — Telehealth: Payer: Self-pay | Admitting: *Deleted

## 2022-08-03 ENCOUNTER — Encounter: Payer: Self-pay | Admitting: Internal Medicine

## 2022-08-03 LAB — BCR ABL1 FISH (GENPATH)

## 2022-08-03 LAB — JAK2 (INCLUDING V617F AND EXON 12), MPL,& CALR W/RFL MPN PANEL (NGS)

## 2022-08-03 NOTE — Telephone Encounter (Signed)
Notified pt w/ Head CT results. Janet Sherman want to know " what are to do about her feeling pain on her right side of her body?".Marland KitchenJohny Chess

## 2022-08-03 NOTE — Telephone Encounter (Signed)
She text back and said " I have been giving her Tylenol some times along with her twice a day aspirin, high blood pressure pills and dementia pill."

## 2022-08-04 MED ORDER — GABAPENTIN 100 MG PO CAPS: 100.0000 mg | ORAL_CAPSULE | Freq: Two times a day (BID) | ORAL | 5 refills | Status: DC

## 2022-08-04 NOTE — Telephone Encounter (Signed)
I will prescribe gabapentin.  We can have Gay Filler see a neurologist for consultation.  Let me know.  Thanks

## 2022-08-06 NOTE — Telephone Encounter (Signed)
Information was give to pt daughter. Will let md know if she need referral..lmb

## 2022-08-07 ENCOUNTER — Other Ambulatory Visit: Payer: Self-pay | Admitting: Hematology and Oncology

## 2022-08-07 DIAGNOSIS — D75839 Thrombocytosis, unspecified: Secondary | ICD-10-CM

## 2022-08-10 ENCOUNTER — Other Ambulatory Visit: Payer: Self-pay | Admitting: Internal Medicine

## 2022-08-10 ENCOUNTER — Telehealth: Payer: Self-pay | Admitting: *Deleted

## 2022-08-10 DIAGNOSIS — G309 Alzheimer's disease, unspecified: Secondary | ICD-10-CM | POA: Insufficient documentation

## 2022-08-10 MED ORDER — OLANZAPINE 2.5 MG PO TABS: 2.5000 mg | ORAL_TABLET | Freq: Every day | ORAL | 5 refills | Status: DC

## 2022-08-10 NOTE — Telephone Encounter (Signed)
TCT patient regarding recent lab results. Spoke with pt's daughter.  Advised that her mother's lab results came back positive for a genetic mutation called JAK2. This implies that she has a bone marrow disorder. To obtain definitive diagnosis, she will need to have a bone marrow biopsy, Advised that radiology scheduling will call her for that appointment. Once that appt is made, we can schedule her to see Dr. Lorenso Courier a week after the biopsy is done, to discuss the results of the biopsy. Daughter voiced understanding.

## 2022-08-10 NOTE — Telephone Encounter (Signed)
-----  Message from Orson Slick, MD sent at 08/07/2022  3:58 PM EDT ----- Regarding: P1 Please let Janet Sherman know that her labs returned and we did find a JAK2 mutation.  This implies that she has a bone marrow disorder.  We will need to pursue a bone marrow biopsy.  This has been ordered.  We will plan to see her back after the biopsy is complete to discuss results.  In the meantime please assure that she is taking p.o. aspirin 81 mg daily. ----- Message ----- From: Buel Ream, Lab In [place] Sent: 07/27/2022   2:51 PM EDT To: Orson Slick, MD

## 2022-08-14 ENCOUNTER — Encounter: Payer: Self-pay | Admitting: Internal Medicine

## 2022-08-14 ENCOUNTER — Ambulatory Visit (INDEPENDENT_AMBULATORY_CARE_PROVIDER_SITE_OTHER): Payer: Medicare Other

## 2022-08-14 ENCOUNTER — Ambulatory Visit (INDEPENDENT_AMBULATORY_CARE_PROVIDER_SITE_OTHER): Payer: Medicare Other | Admitting: Internal Medicine

## 2022-08-14 VITALS — BP 158/100 | HR 55 | Temp 98.2°F | Ht 65.0 in | Wt 115.1 lb

## 2022-08-14 DIAGNOSIS — D75839 Thrombocytosis, unspecified: Secondary | ICD-10-CM | POA: Diagnosis not present

## 2022-08-14 DIAGNOSIS — G309 Alzheimer's disease, unspecified: Secondary | ICD-10-CM | POA: Diagnosis not present

## 2022-08-14 DIAGNOSIS — M542 Cervicalgia: Secondary | ICD-10-CM

## 2022-08-14 DIAGNOSIS — I10 Essential (primary) hypertension: Secondary | ICD-10-CM

## 2022-08-14 DIAGNOSIS — R202 Paresthesia of skin: Secondary | ICD-10-CM

## 2022-08-14 DIAGNOSIS — F4321 Adjustment disorder with depressed mood: Secondary | ICD-10-CM

## 2022-08-14 DIAGNOSIS — F02818 Dementia in other diseases classified elsewhere, unspecified severity, with other behavioral disturbance: Secondary | ICD-10-CM

## 2022-08-14 MED ORDER — AMLODIPINE BESYLATE 2.5 MG PO TABS: 2.5000 mg | ORAL_TABLET | Freq: Every day | ORAL | 3 refills | Status: DC

## 2022-08-14 MED ORDER — DONEPEZIL HCL 10 MG PO TABS: 10.0000 mg | ORAL_TABLET | Freq: Every day | ORAL | 3 refills | Status: DC

## 2022-08-14 NOTE — Progress Notes (Signed)
Subjective:  Patient ID: Janet Sherman, female    DOB: 1951-08-07  Age: 71 y.o. MRN: [id number]  CC: Blood Pressure Check and Numbness (Pain and numbness on right side of body )   HPI Janet Sherman presents for dementia and behavioral issues.  Per [name]'s note:  " I have noticed changes with my mom this past week with her memory. She wakes me up out of my sleep twice stating that she need to take all her money out of her account because she is dreamed that she was dying. This was around 5 am two Saturday ago. I told her that she was fine and she is not dying and that it was just a dream. She then said okay leave the money in there. I had went out of town and left her in my sisters care. She had been fine all day and I went to bed and she had another dream. She woke me up around 12am stating that she is moving out. I asked what she is talking about? She stated that I wasn't doing right by her. I asked how I wasn't? She then said she can't do anything for herself. I asked what is it that I don't allow her to do? She stated that she doesn't know and that she was just dreaming and she knows that I take good care of her. I don't know if it had something to do with me leaving out of town for a few days or not. I am normally with her everyday out side of what. I wonder by me leaving  is why she felt neglected? She says that she been having crazy dreams and she doesn't understands like her mind having her to believe things that's not true. She hides things from herself as well then can't find it. Until days sometimes weeks later."  We sent a Rx for Zyprexa in a couple days ago  C/o Pain and numbness on right side of body - not new  Outpatient Medications Prior to Visit  Medication Sig Dispense Refill   aspirin 81 MG chewable tablet Chew 1 tablet (81 mg total) by mouth 2 (two) times daily. 100 tablet 0   Cholecalciferol (VITAMIN D3) 50 MCG (2000 UT) capsule Take 1 capsule (2,000 Units total) by mouth  daily. 100 capsule 3   fluticasone (FLONASE) 50 MCG/ACT nasal spray Use 2 spray(s) in each nostril once daily 16 g 5   gabapentin (NEURONTIN) 100 MG capsule Take 1 capsule (100 mg total) by mouth 2 (two) times daily. 60 capsule 5   ibuprofen (ADVIL,MOTRIN) 200 MG tablet Take 200 mg by mouth every 6 (six) hours as needed for mild pain.     loratadine (CLARITIN) 10 MG tablet Take 1 tablet (10 mg total) by mouth daily as needed for allergies or rhinitis. 90 tablet 3   metoprolol tartrate (LOPRESSOR) 25 MG tablet Take 1 tablet (25 mg total) by mouth 2 (two) times daily. 180 tablet 3   Multiple Vitamins-Minerals (CENTRUM SILVER 50+WOMEN) TABS Take 1 tablet by mouth daily with breakfast.     OLANZapine (ZYPREXA) 2.5 MG tablet Take 1 tablet (2.5 mg total) by mouth at bedtime. 30 tablet 5   donepezil (ARICEPT) 5 MG tablet Take 1 tablet (5 mg total) by mouth at bedtime. 90 tablet 3   No facility-administered medications prior to visit.    ROS: Review of Systems  Constitutional:  Positive for fatigue. Negative for activity change, appetite change, chills and unexpected weight change.  HENT:  Negative for congestion, mouth sores and sinus pressure.   Eyes:  Negative for visual disturbance.  Respiratory:  Negative for cough and chest tightness.   Gastrointestinal:  Negative for abdominal pain and nausea.  Genitourinary:  Negative for difficulty urinating, frequency and vaginal pain.  Musculoskeletal:  Positive for neck pain and neck stiffness. Negative for back pain and gait problem.  Skin:  Negative for pallor and rash.  Neurological:  Positive for numbness. Negative for dizziness, tremors, weakness and headaches.  Psychiatric/Behavioral:  Positive for behavioral problems, confusion, decreased concentration and dysphoric mood. Negative for sleep disturbance and suicidal ideas. The patient is nervous/anxious.     Objective:  BP (!) 158/100   Pulse (!) 55   Temp 98.2 F (36.8 C) (Oral)   Ht '5\' 5"'$   (1.651 m)   Wt 115 lb 2 oz (52.2 kg)   SpO2 97%   BMI 19.16 kg/m   BP Readings from Last 3 Encounters:  08/14/22 (!) 158/100  07/27/22 (!) 145/84  07/01/22 140/82    Wt Readings from Last 3 Encounters:  08/14/22 115 lb 2 oz (52.2 kg)  07/27/22 115 lb 6.4 oz (52.3 kg)  07/01/22 112 lb (50.8 kg)    Physical Exam Constitutional:      General: She is not in acute distress.    Appearance: Normal appearance. She is well-developed. She is not toxic-appearing.  HENT:     Head: Normocephalic.     Right Ear: External ear normal.     Left Ear: External ear normal.     Nose: Nose normal.  Eyes:     General:        Right eye: No discharge.        Left eye: No discharge.     Conjunctiva/sclera: Conjunctivae normal.     Pupils: Pupils are equal, round, and reactive to light.  Neck:     Thyroid: No thyromegaly.     Vascular: No JVD.     Trachea: No tracheal deviation.  Cardiovascular:     Rate and Rhythm: Normal rate and regular rhythm.     Heart sounds: Normal heart sounds.  Pulmonary:     Effort: No respiratory distress.     Breath sounds: No stridor. No wheezing.  Abdominal:     General: Bowel sounds are normal. There is no distension.     Palpations: Abdomen is soft. There is no mass.     Tenderness: There is no abdominal tenderness. There is no guarding or rebound.  Musculoskeletal:        General: Tenderness present.     Cervical back: Normal range of motion and neck supple. No rigidity.  Lymphadenopathy:     Cervical: No cervical adenopathy.  Skin:    Findings: No erythema or rash.  Neurological:     Mental Status: She is disoriented.     Cranial Nerves: No cranial nerve deficit.     Motor: No weakness or abnormal muscle tone.     Coordination: Coordination normal.     Deep Tendon Reflexes: Reflexes normal.  Psychiatric:        Mood and Affect: Mood normal.        Behavior: Behavior normal.   Neck NT Grip - nl B Thin The patient is pleasant and cooperative,  dressed neatly.  She is disoriented to time.  She does not know what year, month and day it is. She is here by herself      A total time of  45 minutes was spent preparing to see the patient, reviewing tests, x-rays, operative reports and other medical records.  Also, obtaining history and performing comprehensive physical exam.  Additionally, counseling the patient regarding the above listed issues.   Finally, documenting clinical information in the health records, coordination of care. I communicated w/Glorianne's dtrs [facility] and [facility]   Lab Results  Component Value Date   WBC 10.4 07/27/2022   HGB 14.1 07/27/2022   HCT 42.7 07/27/2022   PLT 820 (H) 07/27/2022   GLUCOSE 98 07/27/2022   CHOL 211 (H) 07/01/2022   TRIG 217.0 (H) 07/01/2022   HDL 57.00 07/01/2022   LDLDIRECT 112.0 07/01/2022   LDLCALC 116 (H) 11/13/2021   ALT 12 07/27/2022   AST 19 07/27/2022   NA 141 07/27/2022   K 4.1 07/27/2022   CL 106 07/27/2022   CREATININE 0.94 07/27/2022   BUN 18 07/27/2022   CO2 31 07/27/2022   TSH 2.20 07/01/2022   INR 1.3 (H) 08/09/2019   HGBA1C 5.3 12/26/2016   MICROALBUR 0.98 08/29/2009    CT HEAD WO CONTRAST (5MM)  Result Date: 07/31/2022 CLINICAL DATA:  Paresthesia, numbness, tingling in hand, weakness, light headed, migraine. EXAM: CT HEAD WITHOUT CONTRAST TECHNIQUE: Contiguous axial images were obtained from the base of the skull through the vertex without intravenous contrast. RADIATION DOSE REDUCTION: This exam was performed according to the departmental dose-optimization program which includes automated exposure control, adjustment of the mA and/or kV according to patient size and/or use of iterative reconstruction technique. COMPARISON:  August 09, 2019 FINDINGS: Brain: No evidence of acute infarction, hemorrhage, hydrocephalus, extra-axial collection or mass lesion/mass effect. Sequela of prior craniotomy and hypophysectomy. Vascular: No hyperdense vessel or unexpected  calcification. Skull: Negative for fracture or focal lesion. Sinuses/Orbits: No acute finding. Other: None. IMPRESSION: No acute intracranial abnormality. Electronically Signed   By: Beryle Flock M.D.   On: 07/31/2022 16:33    Assessment & Plan:   Problem List Items Addressed This Visit     Alzheimer's dementia with behavioral disturbance ([place])    Worse.  We will increase your Aricept to 10 mg a day.  We will start a prescription for Zyprexa 2.5 mg to take at night. I communicated w/Asya's dtrs [facility] and [facility] Sil[facility]      Relevant Medications   donepezil (ARICEPT) 10 MG tablet   HTN (hypertension)    Not better  Will re-check BP next time      Relevant Medications   amLODipine (NORVASC) 2.5 MG tablet   Neck pain    [facility] vs other. Will x-ray the neck downstairs today.      Relevant Orders   DG Cervical Spine Complete   Paresthesias - Primary    Will do a C spine X ray today      Relevant Orders   DG Cervical Spine Complete   Situational depression     Cont on Lexapro to 10 mg/d      Thrombocytosis    Bone marrow bx is pending         Meds ordered this encounter  Medications   donepezil (ARICEPT) 10 MG tablet    Sig: Take 1 tablet (10 mg total) by mouth at bedtime.    Dispense:  90 tablet    Refill:  3   amLODipine (NORVASC) 2.5 MG tablet    Sig: Take 1 tablet (2.5 mg total) by mouth daily.    Dispense:  90 tablet    Refill:  3  Follow-up: Return in about 6 weeks (around 09/25/2022) for a follow-up visit.  Walker Kehr, MD

## 2022-08-14 NOTE — Assessment & Plan Note (Signed)
[facility] vs other. Will x-ray the neck downstairs today.

## 2022-08-14 NOTE — Assessment & Plan Note (Signed)
Cont on Lexapro to 10 mg/d

## 2022-08-14 NOTE — Assessment & Plan Note (Addendum)
Worse.  We will increase your Aricept to 10 mg a day.  We will start a prescription for Zyprexa 2.5 mg to take at night. I communicated w/Vaughn's dtrs [facility] and [facility] Silvas

## 2022-08-14 NOTE — Assessment & Plan Note (Addendum)
Not better  Will re-check BP next time

## 2022-08-14 NOTE — Assessment & Plan Note (Addendum)
Bone marrow bx is pending

## 2022-08-14 NOTE — Assessment & Plan Note (Signed)
Will do a C spine X ray today

## 2022-08-23 ENCOUNTER — Other Ambulatory Visit: Payer: Self-pay | Admitting: Radiology

## 2022-08-23 DIAGNOSIS — D75839 Thrombocytosis, unspecified: Secondary | ICD-10-CM

## 2022-08-25 ENCOUNTER — Ambulatory Visit (HOSPITAL_COMMUNITY)
Admission: RE | Admit: 2022-08-25 | Discharge: 2022-08-25 | Disposition: A | Discharge status: Home / Self Care | Payer: Medicare Other | Source: Ambulatory Visit | Attending: Hematology and Oncology | Admitting: Hematology and Oncology

## 2022-08-25 ENCOUNTER — Encounter (HOSPITAL_COMMUNITY): Payer: Self-pay

## 2022-08-25 DIAGNOSIS — Z1379 Encounter for other screening for genetic and chromosomal anomalies: Secondary | ICD-10-CM | POA: Diagnosis not present

## 2022-08-25 DIAGNOSIS — D75839 Thrombocytosis, unspecified: Secondary | ICD-10-CM | POA: Diagnosis present

## 2022-08-25 DIAGNOSIS — Z87442 Personal history of urinary calculi: Secondary | ICD-10-CM | POA: Insufficient documentation

## 2022-08-25 DIAGNOSIS — I10 Essential (primary) hypertension: Secondary | ICD-10-CM | POA: Insufficient documentation

## 2022-08-25 LAB — CBC WITH DIFFERENTIAL/PLATELET
Abs Immature Granulocytes: 0.04 10*3/uL (ref 0.00–0.07)
Basophils Absolute: 0.1 10*3/uL (ref 0.0–0.1)
Basophils Relative: 1 %
Eosinophils Absolute: 0.8 10*3/uL — ABNORMAL HIGH (ref 0.0–0.5)
Eosinophils Relative: 10 %
HCT: 47.6 % — ABNORMAL HIGH (ref 36.0–46.0)
Hemoglobin: 14.7 g/dL (ref 12.0–15.0)
Immature Granulocytes: 1 %
Lymphocytes Relative: 15 %
Lymphs Abs: 1.3 10*3/uL (ref 0.7–4.0)
MCH: 25.6 pg — ABNORMAL LOW (ref 26.0–34.0)
MCHC: 30.9 g/dL (ref 30.0–36.0)
MCV: 82.8 fL (ref 80.0–100.0)
Monocytes Absolute: 0.5 10*3/uL (ref 0.1–1.0)
Monocytes Relative: 5 %
Neutro Abs: 6.1 10*3/uL (ref 1.7–7.7)
Neutrophils Relative %: 68 %
Platelets: 771 10*3/uL — ABNORMAL HIGH (ref 150–400)
RBC: 5.75 MIL/uL — ABNORMAL HIGH (ref 3.87–5.11)
RDW: 13.2 % (ref 11.5–15.5)
WBC: 8.8 10*3/uL (ref 4.0–10.5)
nRBC: 0 % (ref 0.0–0.2)

## 2022-08-25 MED ORDER — SODIUM CHLORIDE 0.9 % IV SOLN: INTRAVENOUS | Status: DC

## 2022-08-25 MED ORDER — NALOXONE HCL 0.4 MG/ML IJ SOLN
INTRAMUSCULAR | Status: AC
  Filled 2022-08-25: qty 1

## 2022-08-25 MED ORDER — MIDAZOLAM HCL 2 MG/2ML IJ SOLN
INTRAMUSCULAR | Status: AC
  Filled 2022-08-25: qty 4

## 2022-08-25 MED ORDER — FLUMAZENIL 0.5 MG/5ML IV SOLN
INTRAVENOUS | Status: AC
  Filled 2022-08-25: qty 5

## 2022-08-25 MED ORDER — FENTANYL CITRATE (PF) 100 MCG/2ML IJ SOLN
INTRAMUSCULAR | Status: AC | PRN
  Administered 2022-08-25: 25 ug via INTRAVENOUS

## 2022-08-25 MED ORDER — MIDAZOLAM HCL 2 MG/2ML IJ SOLN
INTRAMUSCULAR | Status: AC | PRN
  Administered 2022-08-25: .5 mg via INTRAVENOUS

## 2022-08-25 MED ORDER — LIDOCAINE HCL 1 % IJ SOLN
INTRAMUSCULAR | Status: AC | PRN
  Administered 2022-08-25: 10 mL via INTRADERMAL

## 2022-08-25 MED ORDER — MIDAZOLAM HCL 2 MG/2ML IJ SOLN
INTRAMUSCULAR | Status: AC | PRN
  Administered 2022-08-25: 1 mg via INTRAVENOUS

## 2022-08-25 MED ORDER — FENTANYL CITRATE (PF) 100 MCG/2ML IJ SOLN
INTRAMUSCULAR | Status: AC | PRN
  Administered 2022-08-25: 50 ug via INTRAVENOUS

## 2022-08-25 MED ORDER — FENTANYL CITRATE (PF) 100 MCG/2ML IJ SOLN
INTRAMUSCULAR | Status: AC
  Filled 2022-08-25: qty 2

## 2022-08-25 NOTE — Consult Note (Addendum)
Chief Complaint: Patient was seen in consultation today for CT-guided bone marrow biopsy   Referring Physician(s): Dorsey,John T IV  Supervising Physician: Michaelle Birks  Patient Status: [facility] - Out-pt  History of Present Illness: Janet Sherman is a 71 y.o. female with past medical history of arthritis, nephrolithiasis, hypertension who presents now with thrombocytosis of uncertain etiology and a positive JAK2 mutation.  She is scheduled today for CT-guided bone marrow biopsy for further evaluation.  Past Medical History:  Diagnosis Date   Arthritis    "right hand" (01/22/2017)   Gallstones    History of kidney stones    Hypertension    Migraine    "a few/year" (01/22/2017)   Pneumonia X 1    Past Surgical History:  Procedure Laterality Date   CRANIOTOMY N/A 10/16/2016   Procedure: Pituitary tumor (benign) resection - CRANIOTOMY HYPOPHYSECTOMY TRANSNASAL APPROACH;  Surgeon: Consuella Lose, MD;  Location: [facility];  Service: Neurosurgery;  Laterality: N/A;   ELECTROPHYSIOLOGIC STUDY N/A 01/22/2017   Procedure: SVT Ablation;  Surgeon: Will Meredith Leeds, MD;  Location: [place] [facility];  Service: Cardiovascular;  Laterality: N/A;   ELECTROPHYSIOLOGIC STUDY N/A 01/22/2017   Procedure: Electrophysiology Study;  Surgeon: Will Meredith Leeds, MD;  Location: [place] [facility];  Service: Cardiovascular;  Laterality: N/A;   gallstones  ~ 2000   KIDNEY STONE SURGERY     "cut me open"   SUPRAVENTRICULAR TACHYCARDIA ABLATION  01/22/2017   TRANSNASAL APPROACH N/A 10/16/2016   Procedure: TRANSNASAL APPROACH WITH FUSION;  Surgeon: Jerrell Belfast, MD;  Location: [facility] OR;  Service: ENT;  Laterality: N/A;   TUBAL LIGATION      Allergies: Mold extract [trichophyton] and Pollen extract  Medications: Prior to Admission medications   Medication Sig Start Date End Date Taking? Authorizing Provider  amLODipine (NORVASC) 2.5 MG tablet Take 1 tablet (2.5 mg total) by mouth daily. 08/14/22   Yes Plotnikov, Evie Lacks, MD  aspirin 81 MG chewable tablet Chew 1 tablet (81 mg total) by mouth 2 (two) times daily. 11/14/21  Yes Plotnikov, Evie Lacks, MD  fluticasone (FLONASE) 50 MCG/ACT nasal spray Use 2 spray(s) in each nostril once daily 05/12/21  Yes Plotnikov, Evie Lacks, MD  gabapentin (NEURONTIN) 100 MG capsule Take 1 capsule (100 mg total) by mouth 2 (two) times daily. 08/04/22  Yes Plotnikov, Evie Lacks, MD  ibuprofen (ADVIL,MOTRIN) 200 MG tablet Take 200 mg by mouth every 6 (six) hours as needed for mild pain.   Yes [provider]  metoprolol tartrate (LOPRESSOR) 25 MG tablet Take 1 tablet (25 mg total) by mouth 2 (two) times daily. 11/13/21  Yes Plotnikov, Evie Lacks, MD  Multiple Vitamins-Minerals (CENTRUM SILVER 50+WOMEN) TABS Take 1 tablet by mouth daily with breakfast.   Yes [provider]  OLANZapine (ZYPREXA) 2.5 MG tablet Take 1 tablet (2.5 mg total) by mouth at bedtime. 08/10/22  Yes Plotnikov, Evie Lacks, MD  Cholecalciferol (VITAMIN D3) 50 MCG (2000 UT) capsule Take 1 capsule (2,000 Units total) by mouth daily. 11/13/21   Plotnikov, Evie Lacks, MD  donepezil (ARICEPT) 10 MG tablet Take 1 tablet (10 mg total) by mouth at bedtime. 08/14/22   Plotnikov, Evie Lacks, MD  loratadine (CLARITIN) 10 MG tablet Take 1 tablet (10 mg total) by mouth daily as needed for allergies or rhinitis. 11/13/21   Plotnikov, Evie Lacks, MD     Family History  Problem Relation Age of Onset   Hypertension Mother    Cancer Father  lung   Hypertension Sister    Hypertension Brother     Social History   Socioeconomic History   Marital status: Widowed    Spouse name: Jeneen Rinks   Number of children: 5   Years of education: 10   Highest education level: Not on file  Occupational History    Employer: [employer]    Comment: [employer]  Tobacco Use   Smoking status: Former    Packs/day: 1.00    Types: Cigarettes    Quit date: 10/16/2016    Years since  quitting: 5.8   Smokeless tobacco: Never  Vaping Use   Vaping Use: Never used  Substance and Sexual Activity   Alcohol use: No   Drug use: No   Sexual activity: Never  Other Topics Concern   Not on file  Social History Narrative   Patient is married(James) and lives at home with her husband.   Patient has 5 children.   Patient works full-time.   Patient has a 10th  Grade education.   Patient is right handed.   Patient drinks 2-3 cups of coffee daily.   Social Determinants of Health   Financial Resource Strain: Not on file  Food Insecurity: Not on file  Transportation Needs: Not on file  Physical Activity: Not on file  Stress: Not on file  Social Connections: Not on file      Review of Systems denies fever, headache, chest pain, dyspnea, cough, abdominal/back pain, nausea, vomiting or bleeding  Vital Signs: BP 139/79   Pulse (!) 53   Temp (!) 97.5 F (36.4 C) (Oral)   Resp 16   SpO2 98%      Physical Exam awake, alert.  Chest clear to auscultation bilaterally.  Heart with slightly bradycardic but regular rhythm.  Abdomen soft, positive bowel sounds, nontender.  No lower extremity edema.  Imaging: DG Cervical Spine Complete  Result Date: 08/16/2022 CLINICAL DATA:  Neck pain and paresthesias. EXAM: CERVICAL SPINE - COMPLETE 4+ VIEW COMPARISON:  None Available. FINDINGS: Straightening of normal lordosis. No listhesis. Disc space narrowing and spurring at C4-C5, C5-C6, and C6-C7. There is mild multilevel facet hypertrophy. Suggestion of C3-C4 and C4-C5 bony neural foraminal narrowing on the left. Vertebral body heights are normal. No fracture or evidence of focal bone abnormality. No prevertebral soft tissue thickening. IMPRESSION: 1. Degenerative disc disease from C4-C5 through C6-C7. Mild scattered facet arthropathy. 2. Suggestion of bony neural foraminal narrowing on the left at C3-C4 and C4-C5. Electronically Signed   By: Keith Rake M.D.   On: 08/16/2022 20:29    CT HEAD WO CONTRAST (5MM)  Result Date: 07/31/2022 CLINICAL DATA:  Paresthesia, numbness, tingling in hand, weakness, light headed, migraine. EXAM: CT HEAD WITHOUT CONTRAST TECHNIQUE: Contiguous axial images were obtained from the base of the skull through the vertex without intravenous contrast. RADIATION DOSE REDUCTION: This exam was performed according to the departmental dose-optimization program which includes automated exposure control, adjustment of the mA and/or kV according to patient size and/or use of iterative reconstruction technique. COMPARISON:  August 09, 2019 FINDINGS: Brain: No evidence of acute infarction, hemorrhage, hydrocephalus, extra-axial collection or mass lesion/mass effect. Sequela of prior craniotomy and hypophysectomy. Vascular: No hyperdense vessel or unexpected calcification. Skull: Negative for fracture or focal lesion. Sinuses/Orbits: No acute finding. Other: None. IMPRESSION: No acute intracranial abnormality. Electronically Signed   By: Beryle Flock M.D.   On: 07/31/2022 16:33    Labs:  CBC: Recent Labs    11/13/21  5409 07/01/22 1537 07/27/22 1433  WBC 12.5* 10.2 10.4  HGB 14.5 13.2 14.1  HCT 44.7 41.5 42.7  PLT 852.0* 817.0* 820*    COAGS: No results for input(s): "INR", "APTT" in the last 8760 hours.  BMP: Recent Labs    11/13/21 0908 07/01/22 1537 07/27/22 1433  NA 143 141 141  K 3.9 3.6 4.1  CL 103 105 106  CO2 32 29 31  GLUCOSE 68* 81 98  BUN 14 14 18   CALCIUM 9.7 9.6 9.7  CREATININE 0.86 0.94 0.94  GFRNONAA  --   --  >60    LIVER FUNCTION TESTS: Recent Labs    11/13/21 0908 07/01/22 1537 07/27/22 1433  BILITOT 0.4 0.3  0.3 0.4  AST 18 15  15 19   ALT 8 9  9 12   ALKPHOS 57 51  51 50  PROT 7.1 6.9  6.9 7.3  ALBUMIN 4.3 4.3  4.3 4.3    TUMOR MARKERS: No results for input(s): "AFPTM", "CEA", "CA199", "CHROMGRNA" in the last 8760 hours.  Assessment and Plan: 71 y.o. female with past medical history of arthritis,  nephrolithiasis, hypertension who presents now with thrombocytosis of uncertain etiology and a positive JAK2 mutation.  She is scheduled today for CT-guided bone marrow biopsy for further evaluation.Risks and benefits of procedure was discussed with the patient/daughter  including, but not limited to bleeding, infection, damage to adjacent structures or low yield requiring additional tests.  All of the questions were answered and there is agreement to proceed.  Consent signed and in chart.    Thank you for this interesting consult.  I greatly enjoyed meeting Janet Sherman and look forward to participating in their care.  A copy of this report was sent to the requesting provider on this date.  Electronically Signed: D. Rowe Robert, PA-C 08/25/2022, 9:04 AM   I spent a total of   20 minutes  in face to face in clinical consultation, greater than 50% of which was counseling/coordinating care for CT-guided bone marrow biopsy

## 2022-08-25 NOTE — Procedures (Signed)
Vascular and Interventional Radiology Procedure Note  Patient: Janet Sherman DOB: December 05, 1951 Medical Record Number: [id number] Note Date/Time: 08/25/22 9:50 AM   Performing Physician: Michaelle Birks, MD Assistant(s): None  Diagnosis: Thrombocytosis  Procedure: BONE MARROW ASPIRATION and BIOPSY  Anesthesia: Conscious Sedation Complications: None Estimated Blood Loss: Minimal Specimens: Sent for Pathology  Findings:  Successful CT-guided bone marrow aspiration and biopsy A total of 1 cores were obtained. Hemostasis of the tract was achieved using Manual Pressure.  Plan: Bed rest for 1 hours.  See detailed procedure note with images in PACS. The patient tolerated the procedure well without incident or complication and was returned to Recovery in stable condition.    Michaelle Birks, MD [facility]   Pager. [place]

## 2022-08-27 ENCOUNTER — Encounter: Payer: Self-pay | Admitting: Hematology and Oncology

## 2022-08-27 LAB — SURGICAL PATHOLOGY

## 2022-09-02 ENCOUNTER — Encounter (HOSPITAL_COMMUNITY): Payer: Self-pay | Admitting: Hematology and Oncology

## 2022-09-17 ENCOUNTER — Inpatient Hospital Stay: Payer: Medicare Other

## 2022-09-17 ENCOUNTER — Inpatient Hospital Stay: Payer: Medicare Other | Attending: Hematology and Oncology | Admitting: Hematology and Oncology

## 2022-09-17 ENCOUNTER — Other Ambulatory Visit: Payer: Self-pay | Admitting: Hematology and Oncology

## 2022-09-17 DIAGNOSIS — D75839 Thrombocytosis, unspecified: Secondary | ICD-10-CM

## 2022-09-29 ENCOUNTER — Telehealth: Payer: Self-pay

## 2022-09-29 ENCOUNTER — Inpatient Hospital Stay: Payer: Medicare Other | Attending: Hematology and Oncology

## 2022-09-29 ENCOUNTER — Inpatient Hospital Stay: Payer: Medicare Other | Admitting: Hematology and Oncology

## 2022-09-29 DIAGNOSIS — D473 Essential (hemorrhagic) thrombocythemia: Secondary | ICD-10-CM | POA: Insufficient documentation

## 2022-09-29 NOTE — Telephone Encounter (Signed)
This nurse spoke with this patients daughter related to scheduled appointment for today.  Daughter states that she was not aware that the patient had an appointment and would like to reschedule for the patient.  This nurse advised that the scheduling team will reach out to her to reschedule the appointment.  No further questions or concerns noted at this time.

## 2022-10-02 ENCOUNTER — Telehealth: Payer: Self-pay | Admitting: Hematology and Oncology

## 2022-10-02 NOTE — Telephone Encounter (Signed)
Per 10/5 in basket, pt daughter called but voicemail full, will try back later

## 2022-10-15 ENCOUNTER — Inpatient Hospital Stay: Payer: Medicare Other | Admitting: Hematology and Oncology

## 2022-10-15 ENCOUNTER — Inpatient Hospital Stay (HOSPITAL_BASED_OUTPATIENT_CLINIC_OR_DEPARTMENT_OTHER): Payer: Medicare Other

## 2022-10-15 ENCOUNTER — Telehealth: Payer: Self-pay | Admitting: Hematology and Oncology

## 2022-10-15 ENCOUNTER — Other Ambulatory Visit: Payer: Self-pay

## 2022-10-15 VITALS — BP 141/89 | HR 68 | Temp 97.4°F | Resp 15 | Wt 122.3 lb

## 2022-10-15 DIAGNOSIS — D473 Essential (hemorrhagic) thrombocythemia: Secondary | ICD-10-CM

## 2022-10-15 DIAGNOSIS — D75839 Thrombocytosis, unspecified: Secondary | ICD-10-CM

## 2022-10-15 LAB — CMP (CANCER CENTER ONLY)
ALT: 8 U/L (ref 0–44)
AST: 16 U/L (ref 15–41)
Albumin: 4.1 g/dL (ref 3.5–5.0)
Alkaline Phosphatase: 53 U/L (ref 38–126)
Anion gap: 5 (ref 5–15)
BUN: 15 mg/dL (ref 8–23)
CO2: 32 mmol/L (ref 22–32)
Calcium: 9.5 mg/dL (ref 8.9–10.3)
Chloride: 105 mmol/L (ref 98–111)
Creatinine: 0.94 mg/dL (ref 0.44–1.00)
GFR, Estimated: >60 mL/min (ref >60)
Glucose, Bld: 81 mg/dL (ref 70–99)
Potassium: 4.3 mmol/L (ref 3.5–5.1)
Sodium: 142 mmol/L (ref 135–145)
Total Bilirubin: 0.4 mg/dL (ref 0.3–1.2)
Total Protein: 7 g/dL (ref 6.5–8.1)

## 2022-10-15 LAB — CBC WITH DIFFERENTIAL (CANCER CENTER ONLY)
Abs Immature Granulocytes: 0.04 10*3/uL (ref 0.00–0.07)
Basophils Absolute: 0.1 10*3/uL (ref 0.0–0.1)
Basophils Relative: 1 %
Eosinophils Absolute: 0.6 10*3/uL — ABNORMAL HIGH (ref 0.0–0.5)
Eosinophils Relative: 7 %
HCT: 43.3 % (ref 36.0–46.0)
Hemoglobin: 14.1 g/dL (ref 12.0–15.0)
Immature Granulocytes: 0 %
Lymphocytes Relative: 12 %
Lymphs Abs: 1.2 10*3/uL (ref 0.7–4.0)
MCH: 25.6 pg — ABNORMAL LOW (ref 26.0–34.0)
MCHC: 32.6 g/dL (ref 30.0–36.0)
MCV: 78.6 fL — ABNORMAL LOW (ref 80.0–100.0)
Monocytes Absolute: 0.5 10*3/uL (ref 0.1–1.0)
Monocytes Relative: 6 %
Neutro Abs: 7 10*3/uL (ref 1.7–7.7)
Neutrophils Relative %: 74 %
Platelet Count: 813 10*3/uL — ABNORMAL HIGH (ref 150–400)
RBC: 5.51 MIL/uL — ABNORMAL HIGH (ref 3.87–5.11)
RDW: 13.3 % (ref 11.5–15.5)
WBC Count: 9.4 10*3/uL (ref 4.0–10.5)
nRBC: 0 % (ref 0.0–0.2)

## 2022-10-15 MED ORDER — HYDROXYUREA 500 MG PO CAPS: 500.0000 mg | ORAL_CAPSULE | Freq: Every day | ORAL | 1 refills | Status: DC

## 2022-10-15 NOTE — Progress Notes (Signed)
[place] [phone]   [phone]  PROGRESS NOTE  Patient Care Team: Plotnikov, Evie Lacks, MD as PCP - General (Internal Medicine)  Hematological/Oncological History # Essential Thrombocytosis, JAK2 positive  11/13/2021: WBC 12.5, Hgb 14.5, MCV 79.1, Plt 852 07/01/2022: WBC 10.2, Hgb 13.2, MCV 79.5, Plt 817 07/27/2022: establish care with Dr. Lorenso Courier   Interval History:  Janet Sherman 71 y.o. female with medical history significant for essential thrombocytosis who presents for a follow up visit. The patient's last visit was on 07/27/2022 at which time she established care. In the interim since the last visit she underwent a bone marrow biopsy which confirmed JAK2 positive essential thrombocytosis.   On exam today Janet Sherman is accompanied by her daughter. She reports she has been well overall in the interim since her last visit.  She tolerated the bone marrow biopsy well without any difficulty.  She notes that she is not having any trouble with fevers, chills, sweats, nausea, vomiting or diarrhea.  She does not currently have any signs or symptoms concerning for VTE.  Overall she feels well.  A full 10 point ROS was otherwise negative.  The bulk of our discussion focused on the diagnosis of essential thrombocytosis and treatment.  Details of this conversation are noted below.  MEDICAL HISTORY:  Past Medical History:  Diagnosis Date   Arthritis    "right hand" (01/22/2017)   Gallstones    History of kidney stones    Hypertension    Migraine    "a few/year" (01/22/2017)   Pneumonia X 1    SURGICAL HISTORY: Past Surgical History:  Procedure Laterality Date   CRANIOTOMY N/A 10/16/2016   Procedure: Pituitary tumor (benign) resection - CRANIOTOMY HYPOPHYSECTOMY TRANSNASAL APPROACH;  Surgeon: Consuella Lose, MD;  Location: [place];  Service: Neurosurgery;  Laterality: N/A;   ELECTROPHYSIOLOGIC STUDY N/A 01/22/2017   Procedure: SVT Ablation;  Surgeon:  Will Meredith Leeds, MD;  Location: [place] [facility];  Service: Cardiovascular;  Laterality: N/A;   ELECTROPHYSIOLOGIC STUDY N/A 01/22/2017   Procedure: Electrophysiology Study;  Surgeon: Will Meredith Leeds, MD;  Location: [place] [facility];  Service: Cardiovascular;  Laterality: N/A;   gallstones  ~ 2000   KIDNEY STONE SURGERY     "cut me open"   SUPRAVENTRICULAR TACHYCARDIA ABLATION  01/22/2017   TRANSNASAL APPROACH N/A 10/16/2016   Procedure: TRANSNASAL APPROACH WITH FUSION;  Surgeon: Jerrell Belfast, MD;  Location: [facility] OR;  Service: ENT;  Laterality: N/A;   TUBAL LIGATION      SOCIAL HISTORY: Social History   Socioeconomic History   Marital status: Widowed    Spouse name: Jeneen Rinks   Number of children: 5   Years of education: 10   Highest education level: Not on file  Occupational History    Employer: [employer]    Comment: [employer]  Tobacco Use   Smoking status: Former    Packs/day: 1.00    Types: Cigarettes    Quit date: 10/16/2016    Years since quitting: 6.0   Smokeless tobacco: Never  Vaping Use   Vaping Use: Never used  Substance and Sexual Activity   Alcohol use: No   Drug use: No   Sexual activity: Never  Other Topics Concern   Not on file  Social History Narrative   Patient is married(James) and lives at home with her husband.   Patient has 5 children.   Patient works full-time.   Patient has a 10th  Grade education.  Patient is right handed.   Patient drinks 2-3 cups of coffee daily.   Social Determinants of Health   Financial Resource Strain: Not on file  Food Insecurity: Not on file  Transportation Needs: Not on file  Physical Activity: Not on file  Stress: Not on file  Social Connections: Not on file  Intimate Partner Violence: Not on file    FAMILY HISTORY: Family History  Problem Relation Age of Onset   Hypertension Mother    Cancer Father        lung   Hypertension Sister    Hypertension Brother      ALLERGIES:  is allergic to mold extract [trichophyton] and pollen extract.  MEDICATIONS:  Current Outpatient Medications  Medication Sig Dispense Refill   hydroxyurea (HYDREA) 500 MG capsule Take 1 capsule (500 mg total) by mouth daily. May take with food to minimize GI side effects. 90 capsule 1   amLODipine (NORVASC) 2.5 MG tablet Take 1 tablet (2.5 mg total) by mouth daily. 90 tablet 3   aspirin 81 MG chewable tablet Chew 1 tablet (81 mg total) by mouth 2 (two) times daily. 100 tablet 0   Cholecalciferol (VITAMIN D3) 50 MCG (2000 UT) capsule Take 1 capsule (2,000 Units total) by mouth daily. 100 capsule 3   donepezil (ARICEPT) 10 MG tablet Take 1 tablet (10 mg total) by mouth at bedtime. 90 tablet 3   gabapentin (NEURONTIN) 100 MG capsule Take 1 capsule (100 mg total) by mouth 2 (two) times daily. 60 capsule 5   ibuprofen (ADVIL,MOTRIN) 200 MG tablet Take 200 mg by mouth every 6 (six) hours as needed for mild pain.     metoprolol tartrate (LOPRESSOR) 25 MG tablet Take 1 tablet (25 mg total) by mouth 2 (two) times daily. 180 tablet 3   Multiple Vitamins-Minerals (CENTRUM SILVER 50+WOMEN) TABS Take 1 tablet by mouth daily with breakfast.     OLANZapine (ZYPREXA) 2.5 MG tablet Take 1 tablet (2.5 mg total) by mouth at bedtime. 30 tablet 5   No current facility-administered medications for this visit.    REVIEW OF SYSTEMS:   Constitutional: ( - ) fevers, ( - )  chills , ( - ) night sweats Eyes: ( - ) blurriness of vision, ( - ) double vision, ( - ) watery eyes Ears, nose, mouth, throat, and face: ( - ) mucositis, ( - ) sore throat Respiratory: ( - ) cough, ( - ) dyspnea, ( - ) wheezes Cardiovascular: ( - ) palpitation, ( - ) chest discomfort, ( - ) lower extremity swelling Gastrointestinal:  ( - ) nausea, ( - ) heartburn, ( - ) change in bowel habits Skin: ( - ) abnormal skin rashes Lymphatics: ( - ) new lymphadenopathy, ( - ) easy bruising Neurological: ( - ) numbness, ( - )  tingling, ( - ) new weaknesses Behavioral/Psych: ( - ) mood change, ( - ) new changes  All other systems were reviewed with the patient and are negative.  PHYSICAL EXAMINATION: ECOG PERFORMANCE STATUS: 0 - Asymptomatic  Vitals:   10/15/22 1004  BP: (!) 141/89  Pulse: 68  Resp: 15  Temp: (!) 97.4 F (36.3 C)  SpO2: 97%   Filed Weights   10/15/22 1004  Weight: 122 lb 4.8 oz (55.5 kg)    GENERAL: Well-appearing elderly African-American female, alert, no distress and comfortable SKIN: skin color, texture, turgor are normal, no rashes or significant lesions EYES: conjunctiva are pink and non-injected, sclera clear LUNGS: clear to  auscultation and percussion with normal breathing effort HEART: regular rate & rhythm and no murmurs and no lower extremity edema Musculoskeletal: no cyanosis of digits and no clubbing  PSYCH: alert & oriented x 3, fluent speech NEURO: no focal motor/sensory deficits  LABORATORY DATA:  I have reviewed the data as listed    Latest Ref Rng & Units 10/15/2022    9:49 AM 08/25/2022    9:33 AM 07/27/2022    2:33 PM  CBC  WBC 4.0 - 10.5 K/uL 9.4  8.8  10.4   Hemoglobin 12.0 - 15.0 g/dL 14.1  14.7  14.1   Hematocrit 36.0 - 46.0 % 43.3  47.6  42.7   Platelets 150 - 400 K/uL 813  771  820        Latest Ref Rng & Units 10/15/2022    9:49 AM 07/27/2022    2:33 PM 07/01/2022    3:37 PM  CMP  Glucose 70 - 99 mg/dL 81  98  81   BUN 8 - 23 mg/dL $Remove'15  18  14   'QuQYShU$ Creatinine 0.44 - 1.00 mg/dL 0.94  0.94  0.94   Sodium 135 - 145 mmol/L 142  141  141   Potassium 3.5 - 5.1 mmol/L 4.3  4.1  3.6   Chloride 98 - 111 mmol/L 105  106  105   CO2 22 - 32 mmol/L 32  31  29   Calcium 8.9 - 10.3 mg/dL 9.5  9.7  9.6   Total Protein 6.5 - 8.1 g/dL 7.0  7.3  6.9    6.9   Total Bilirubin 0.3 - 1.2 mg/dL 0.4  0.4  0.3    0.3   Alkaline Phos 38 - 126 U/L 53  50  51    51   AST 15 - 41 U/L $Remo'16  19  15    15   'xCran$ ALT 0 - 44 U/L $Remo'8  12  9    9     'vRXJu$ RADIOGRAPHIC STUDIES: No  results found.  ASSESSMENT & PLAN Janet Sherman 71 y.o. female with medical history significant for essential thrombocytosis who presents for a follow up visit.   Today we discussed the diagnosis of essential thrombocytosis.  Essential thrombocytosis and myeloproliferative neoplasm by the patient has overproduction of a particular blood cell.  In this case the patient has a JAK2 mutation which is causing thrombocytosis.  Goals of treatment including monitoring for progression to myelofibrosis and reducing risk of VTE.  Recommend aspirin 81 mg p.o. daily and starting cytoreductive therapy with hydroxyurea 500 mg p.o. daily.  The patient voiced understanding of the risks and benefits of this treatment.  We will plan to see her back in 4 weeks time to reevaluate.  #Essential Thrombocytosis, JAK2 Positive -- Diagnosis confirmed by the presence of JAK2 and bone marrow biopsy showing myeloproliferative neoplasm. -- Patient currently on 81 mg baby aspirin daily, recommend continuation of this -- We will start hydroxyurea 500 mg p.o. daily for cytoreductive therapy. -- Labs today show white blood cell count 9.4, hemoglobin 14.1, MCV 78.6, and platelets of 813 -- Return to clinic in 4 weeks time for evaluation on hydroxyurea therapy.  No orders of the defined types were placed in this encounter.   All questions were answered. The patient knows to call the clinic with any problems, questions or concerns.  A total of more than 30 minutes were spent on this encounter with face-to-face time and non-face-to-face time, including preparing to see the  patient, ordering tests and/or medications, counseling the patient and coordination of care as outlined above.   Ledell Peoples, MD Department of Hematology/Oncology [place] [facility] Phone: [phone] Pager: [phone] Email: [email or web address]  10/15/2022 10:44 AM

## 2022-10-15 NOTE — Telephone Encounter (Signed)
Per 10/19 los called and spoke to pt daughter about pt appointment

## 2022-11-06 ENCOUNTER — Telehealth: Payer: Self-pay | Admitting: Internal Medicine

## 2022-11-06 ENCOUNTER — Other Ambulatory Visit: Payer: Self-pay | Admitting: Internal Medicine

## 2022-11-06 NOTE — Telephone Encounter (Signed)
Caller & Relationship to patient: Daughter  Call back number: [phone]  Date of last office visit: 08/14/2022  Date of next office visit: 11/10/2022  Medication(s) to be refilled:  metoprolol tartrate (LOPRESSOR) 25 MG tablet       Preferred Pharmacy:   [facility] [place] ([place]), [place] - [address]

## 2022-11-09 MED ORDER — METOPROLOL TARTRATE 25 MG PO TABS: 25.0000 mg | ORAL_TABLET | Freq: Two times a day (BID) | ORAL | 1 refills | Status: DC

## 2022-11-09 NOTE — Telephone Encounter (Signed)
Sent refill to [facility].../lmb 

## 2022-11-10 ENCOUNTER — Ambulatory Visit (INDEPENDENT_AMBULATORY_CARE_PROVIDER_SITE_OTHER): Payer: Medicare Other | Admitting: Internal Medicine

## 2022-11-10 ENCOUNTER — Encounter: Payer: Self-pay | Admitting: Internal Medicine

## 2022-11-10 VITALS — BP 130/72 | HR 80 | Temp 100.1°F | Ht 65.0 in | Wt 122.0 lb

## 2022-11-10 DIAGNOSIS — E538 Deficiency of other specified B group vitamins: Secondary | ICD-10-CM | POA: Diagnosis not present

## 2022-11-10 DIAGNOSIS — G309 Alzheimer's disease, unspecified: Secondary | ICD-10-CM

## 2022-11-10 DIAGNOSIS — I10 Essential (primary) hypertension: Secondary | ICD-10-CM

## 2022-11-10 DIAGNOSIS — D75839 Thrombocytosis, unspecified: Secondary | ICD-10-CM | POA: Diagnosis not present

## 2022-11-10 DIAGNOSIS — F02818 Dementia in other diseases classified elsewhere, unspecified severity, with other behavioral disturbance: Secondary | ICD-10-CM

## 2022-11-10 DIAGNOSIS — Z23 Encounter for immunization: Secondary | ICD-10-CM | POA: Diagnosis not present

## 2022-11-10 DIAGNOSIS — E785 Hyperlipidemia, unspecified: Secondary | ICD-10-CM | POA: Diagnosis not present

## 2022-11-10 MED ORDER — METOPROLOL TARTRATE 25 MG PO TABS
25.0000 mg | ORAL_TABLET | Freq: Two times a day (BID) | ORAL | 1 refills | Status: DC
Start: 2022-11-10 — End: 2023-01-01

## 2022-11-10 MED ORDER — GABAPENTIN 100 MG PO CAPS: 100.0000 mg | ORAL_CAPSULE | Freq: Two times a day (BID) | ORAL | 1 refills | Status: DC

## 2022-11-10 MED ORDER — DONEPEZIL HCL 10 MG PO TABS: 10.0000 mg | ORAL_TABLET | Freq: Every day | ORAL | 3 refills | Status: DC

## 2022-11-10 MED ORDER — VITAMIN D3 50 MCG (2000 UT) PO CAPS
2000.0000 [IU] | ORAL_CAPSULE | Freq: Every day | ORAL | 3 refills | Status: DC
Start: 2022-11-10 — End: 2024-05-15

## 2022-11-10 MED ORDER — B COMPLEX PLUS PO TABS: 1.0000 | ORAL_TABLET | Freq: Every day | ORAL | 3 refills | Status: DC

## 2022-11-10 MED ORDER — OLANZAPINE 2.5 MG PO TABS: 2.5000 mg | ORAL_TABLET | Freq: Every day | ORAL | 1 refills | Status: DC

## 2022-11-10 MED ORDER — AMLODIPINE BESYLATE 2.5 MG PO TABS: 2.5000 mg | ORAL_TABLET | Freq: Every day | ORAL | 3 refills | Status: DC

## 2022-11-10 NOTE — Assessment & Plan Note (Signed)
On Lipitor, ASA

## 2022-11-10 NOTE — Assessment & Plan Note (Signed)
Bone marrow bx - Essential Thrombocytosis, JAK2 Positive  Dr Lorenso Courier On hydroxyurea, ASA

## 2022-11-10 NOTE — Assessment & Plan Note (Signed)
Cont on Amlodipine 2.5 mg/d and Toprol

## 2022-11-10 NOTE — Progress Notes (Signed)
Subjective:  Patient ID: Janet Sherman, female    DOB: July 14, 1951  Age: 71 y.o. MRN: [id number]  CC: Follow-up   HPI Janet Sherman presents for HTN, dementia, thrombocytosis   Outpatient Medications Prior to Visit  Medication Sig Dispense Refill   aspirin 81 MG chewable tablet Chew 1 tablet (81 mg total) by mouth 2 (two) times daily. 100 tablet 0   hydroxyurea (HYDREA) 500 MG capsule Take 1 capsule (500 mg total) by mouth daily. May take with food to minimize GI side effects. 90 capsule 1   ibuprofen (ADVIL,MOTRIN) 200 MG tablet Take 200 mg by mouth every 6 (six) hours as needed for mild pain.     amLODipine (NORVASC) 2.5 MG tablet Take 1 tablet (2.5 mg total) by mouth daily. 90 tablet 3   Cholecalciferol (VITAMIN D3) 50 MCG (2000 UT) capsule Take 1 capsule (2,000 Units total) by mouth daily. 100 capsule 3   gabapentin (NEURONTIN) 100 MG capsule Take 1 capsule (100 mg total) by mouth 2 (two) times daily. 60 capsule 5   Multiple Vitamins-Minerals (CENTRUM SILVER 50+WOMEN) TABS Take 1 tablet by mouth daily with breakfast.     OLANZapine (ZYPREXA) 2.5 MG tablet Take 1 tablet (2.5 mg total) by mouth at bedtime. 30 tablet 5   donepezil (ARICEPT) 10 MG tablet Take 1 tablet (10 mg total) by mouth at bedtime. (Patient not taking: Reported on 11/10/2022) 90 tablet 3   metoprolol tartrate (LOPRESSOR) 25 MG tablet Take 1 tablet (25 mg total) by mouth 2 (two) times daily. (Patient not taking: Reported on 11/10/2022) 180 tablet 1   No facility-administered medications prior to visit.    ROS: Review of Systems  Constitutional:  Negative for activity change, appetite change, chills, fatigue, fever and unexpected weight change.  HENT:  Negative for congestion, mouth sores and sinus pressure.   Eyes:  Negative for visual disturbance.  Respiratory:  Negative for cough and chest tightness.   Gastrointestinal:  Negative for abdominal pain and nausea.  Genitourinary:  Negative for difficulty urinating,  frequency and vaginal pain.  Musculoskeletal:  Negative for back pain and gait problem.  Skin:  Negative for pallor and rash.  Neurological:  Negative for dizziness, tremors, weakness, numbness and headaches.  Psychiatric/Behavioral:  Positive for confusion and decreased concentration. Negative for agitation, behavioral problems, self-injury, sleep disturbance and suicidal ideas. The patient is not nervous/anxious.     Objective:  BP 130/72   Pulse 80   Temp 100.1 F (37.8 C) (Oral)   Ht _0  (1.651 m)   Wt 122 lb (55.3 kg)   SpO2 99%   BMI 20.30 kg/m   BP Readings from Last 3 Encounters:  11/10/22 130/72  10/15/22 (!) 141/89  08/25/22 (!) 146/85    Wt Readings from Last 3 Encounters:  11/10/22 122 lb (55.3 kg)  10/15/22 122 lb 4.8 oz (55.5 kg)  08/14/22 115 lb 2 oz (52.2 kg)    Physical Exam Constitutional:      General: She is not in acute distress.    Appearance: Normal appearance. She is well-developed.  HENT:     Head: Normocephalic.     Right Ear: External ear normal.     Left Ear: External ear normal.     Nose: Nose normal.  Eyes:     General:        Right eye: No discharge.        Left eye: No discharge.     Conjunctiva/sclera: Conjunctivae normal.  Pupils: Pupils are equal, round, and reactive to light.  Neck:     Thyroid: No thyromegaly.     Vascular: No JVD.     Trachea: No tracheal deviation.  Cardiovascular:     Rate and Rhythm: Normal rate and regular rhythm.     Heart sounds: Normal heart sounds.  Pulmonary:     Effort: No respiratory distress.     Breath sounds: No stridor. No wheezing.  Abdominal:     General: Bowel sounds are normal. There is no distension.     Palpations: Abdomen is soft. There is no mass.     Tenderness: There is no abdominal tenderness. There is no guarding or rebound.  Musculoskeletal:        General: No tenderness.     Cervical back: Normal range of motion and neck supple. No rigidity.  Lymphadenopathy:      Cervical: No cervical adenopathy.  Skin:    Findings: No erythema or rash.  Neurological:     Mental Status: Mental status is at baseline.     Cranial Nerves: No cranial nerve deficit.     Motor: No weakness or abnormal muscle tone.     Coordination: Coordination normal.     Gait: Gait normal.     Deep Tendon Reflexes: Reflexes normal.  Psychiatric:        Behavior: Behavior normal.        Thought Content: Thought content normal.        Judgment: Judgment normal.   Disoriented x 3 A/o/c  Lab Results  Component Value Date   WBC 9.4 10/15/2022   HGB 14.1 10/15/2022   HCT 43.3 10/15/2022   PLT 813 (H) 10/15/2022   GLUCOSE 81 10/15/2022   CHOL 211 (H) 07/01/2022   TRIG 217.0 (H) 07/01/2022   HDL 57.00 07/01/2022   LDLDIRECT 112.0 07/01/2022   LDLCALC 116 (H) 11/13/2021   ALT 8 10/15/2022   AST 16 10/15/2022   NA 142 10/15/2022   K 4.3 10/15/2022   CL 105 10/15/2022   CREATININE 0.94 10/15/2022   BUN 15 10/15/2022   CO2 32 10/15/2022   TSH 2.20 07/01/2022   INR 1.3 (H) 08/09/2019   HGBA1C 5.3 12/26/2016   MICROALBUR 0.98 08/29/2009    CT BONE MARROW BIOPSY & ASPIRATION  Result Date: 08/25/2022 INDICATION: Thrombocytosis. EXAM: CT GUIDED BONE MARROW ASPIRATION AND CORE BIOPSY MEDICATIONS: None. ANESTHESIA/SEDATION: Moderate (conscious) sedation was employed during this procedure. A total of Versed 0.5 mg and Fentanyl 75 mcg was administered intravenously. Moderate Sedation Time: 15 minutes. The patient's level of consciousness and vital signs were monitored continuously by radiology nursing throughout the procedure under my direct supervision. FLUOROSCOPY TIME:  CT dose in mGy was not provided. COMPLICATIONS: None immediate. Estimated blood loss: <5 mL PROCEDURE: RADIATION DOSE REDUCTION: This exam was performed according to the departmental dose-optimization program which includes automated exposure control, adjustment of the mA and/or kV according to patient size and/or use  of iterative reconstruction technique. Informed written consent was obtained from the the patient and/or patient's representative after a thorough discussion of the procedural risks, benefits and alternatives. All questions were addressed. Maximal Sterile Barrier Technique was utilized including caps, mask, sterile gowns, sterile gloves, sterile drape, hand hygiene and skin antiseptic. A timeout was performed prior to the initiation of the procedure. The patient was positioned prone and non-contrast localization CT was performed of the pelvis to demonstrate the iliac marrow spaces. Maximal barrier sterile technique utilized including caps, mask,  sterile gowns, sterile gloves, large sterile drape, hand hygiene, and chlorhexidine prep. Under sterile conditions and local anesthesia, an 11 gauge coaxial bone biopsy needle was advanced into the RIGHT iliac marrow space. Needle position was confirmed with CT imaging. Initially, bone marrow aspiration was performed. Next, the 11 gauge outer cannula was utilized to obtain a 1 iliac bone marrow core biopsy. Needle was removed. Hemostasis was obtained with compression. The patient tolerated the procedure well. Samples were prepared with the cytotechnologist. IMPRESSION: Successful CT-guided bone marrow aspiration and biopsy, as above. Michaelle Birks, MD [facility] Electronically Signed   By: Michaelle Birks M.D.   On: 08/25/2022 17:06    Assessment & Plan:   Problem List Items Addressed This Visit     Dyslipidemia    On Lipitor, ASA      Relevant Orders   TSH   Urinalysis   CBC with Differential/Platelet   Lipid panel   Comprehensive metabolic panel   Q22 deficiency    Re-start B complex Risks associated with treatment noncompliance were discussed. Compliance was encouraged.       Relevant Orders   TSH   Urinalysis   CBC with Differential/Platelet   Lipid panel   Comprehensive metabolic panel    HTN (hypertension)    Cont on Amlodipine 2.5 mg/d and Toprol      Relevant Medications   amLODipine (NORVASC) 2.5 MG tablet   metoprolol tartrate (LOPRESSOR) 25 MG tablet   Thrombocytosis    Bone marrow bx - Essential Thrombocytosis, JAK2 Positive  Dr Lorenso Courier On hydroxyurea, ASA      Relevant Orders   TSH   Urinalysis   CBC with Differential/Platelet   Lipid panel   Comprehensive metabolic panel   Alzheimer's dementia with behavioral disturbance (HCC)    Stable Continue on Aricept to 10 mg a day and Zyprexa 2.5 mg to take at night.      Relevant Medications   donepezil (ARICEPT) 10 MG tablet   gabapentin (NEURONTIN) 100 MG capsule   OLANZapine (ZYPREXA) 2.5 MG tablet   Other Relevant Orders   TSH   Urinalysis   CBC with Differential/Platelet   Lipid panel   Comprehensive metabolic panel   Other Visit Diagnoses     Flu vaccine need    -  Primary   Relevant Orders   Flu Vaccine QUAD High Dose(Fluad) (Completed)         Meds ordered this encounter  Medications   amLODipine (NORVASC) 2.5 MG tablet    Sig: Take 1 tablet (2.5 mg total) by mouth daily.    Dispense:  90 tablet    Refill:  3   Cholecalciferol (VITAMIN D3) 50 MCG (2000 UT) capsule    Sig: Take 1 capsule (2,000 Units total) by mouth daily.    Dispense:  100 capsule    Refill:  3   donepezil (ARICEPT) 10 MG tablet    Sig: Take 1 tablet (10 mg total) by mouth at bedtime.    Dispense:  90 tablet    Refill:  3   gabapentin (NEURONTIN) 100 MG capsule    Sig: Take 1 capsule (100 mg total) by mouth 2 (two) times daily.    Dispense:  180 capsule    Refill:  1   metoprolol tartrate (LOPRESSOR) 25 MG tablet    Sig: Take 1 tablet (25 mg total) by mouth 2 (two) times daily.    Dispense:  180 tablet    Refill:  1   OLANZapine (ZYPREXA) 2.5 MG tablet    Sig: Take 1 tablet (2.5 mg total) by mouth at bedtime.    Dispense:  90 tablet    Refill:  1   B Complex-Folic Acid (B COMPLEX PLUS) TABS    Sig: Take 1  tablet by mouth daily.    Dispense:  100 tablet    Refill:  3      Follow-up: Return in about 6 months (around 05/11/2023) for Wellness Exam.  Walker Kehr, MD

## 2022-11-10 NOTE — Assessment & Plan Note (Signed)
Stable Continue on Aricept to 10 mg a day and Zyprexa 2.5 mg to take at night.

## 2022-11-10 NOTE — Assessment & Plan Note (Addendum)
Re-start B complex Risks associated with treatment noncompliance were discussed. Compliance was encouraged.

## 2022-11-17 ENCOUNTER — Other Ambulatory Visit: Payer: Self-pay | Admitting: Hematology and Oncology

## 2022-11-17 DIAGNOSIS — D473 Essential (hemorrhagic) thrombocythemia: Secondary | ICD-10-CM

## 2022-11-17 NOTE — Progress Notes (Unsigned)
[place] [phone]   [phone]  PROGRESS NOTE  Patient Care Team: Plotnikov, Evie Lacks, MD as PCP - General (Internal Medicine)  Hematological/Oncological History # Essential Thrombocytosis, JAK2 positive  11/13/2021: WBC 12.5, Hgb 14.5, MCV 79.1, Plt 852 07/01/2022: WBC 10.2, Hgb 13.2, MCV 79.5, Plt 817 07/27/2022: establish care with Dr. Lorenso Courier   Interval History:  Janet Sherman 71 y.o. female with medical history significant for essential thrombocytosis who presents for a follow up visit. The patient's last visit was on 07/27/2022 at which time she established care. In the interim since the last visit she underwent a bone marrow biopsy which confirmed JAK2 positive essential thrombocytosis.   On exam today Janet Sherman is accompanied by her daughter. She reports she has been well overall in the interim since her last visit.  She tolerated the bone marrow biopsy well without any difficulty.  She notes that she is not having any trouble with fevers, chills, sweats, nausea, vomiting or diarrhea.  She does not currently have any signs or symptoms concerning for VTE.  Overall she feels well.  A full 10 point ROS was otherwise negative.  The bulk of our discussion focused on the diagnosis of essential thrombocytosis and treatment.  Details of this conversation are noted below.  MEDICAL HISTORY:  Past Medical History:  Diagnosis Date   Arthritis    "right hand" (01/22/2017)   Gallstones    History of kidney stones    Hypertension    Migraine    "a few/year" (01/22/2017)   Pneumonia X 1    SURGICAL HISTORY: Past Surgical History:  Procedure Laterality Date   CRANIOTOMY N/A 10/16/2016   Procedure: Pituitary tumor (benign) resection - CRANIOTOMY HYPOPHYSECTOMY TRANSNASAL APPROACH;  Surgeon: Consuella Lose, MD;  Location: [place];  Service: Neurosurgery;  Laterality: N/A;   ELECTROPHYSIOLOGIC STUDY N/A 01/22/2017   Procedure: SVT Ablation;  Surgeon:  Will Meredith Leeds, MD;  Location: [place] [facility];  Service: Cardiovascular;  Laterality: N/A;   ELECTROPHYSIOLOGIC STUDY N/A 01/22/2017   Procedure: Electrophysiology Study;  Surgeon: Will Meredith Leeds, MD;  Location: [place] [facility];  Service: Cardiovascular;  Laterality: N/A;   gallstones  ~ 2000   KIDNEY STONE SURGERY     "cut me open"   SUPRAVENTRICULAR TACHYCARDIA ABLATION  01/22/2017   TRANSNASAL APPROACH N/A 10/16/2016   Procedure: TRANSNASAL APPROACH WITH FUSION;  Surgeon: Jerrell Belfast, MD;  Location[facility];  Service: ENT;  Laterality: N/A;   TUBAL LIGATION      SOCIAL HISTORY: Social History   Socioeconomic History   Marital status: Widowed    Spouse name: Jeneen Rinks   Number of children: 5   Years of education: 10   Highest education level: Not on file  Occupational History    Employer: [employer]    Comment: [employer]  Tobacco Use   Smoking status: Former    Packs/day: 1.00    Types: Cigarettes    Quit date: 10/16/2016    Years since quitting: 6.0   Smokeless tobacco: Never  Vaping Use   Vaping Use: Never used  Substance and Sexual Activity   Alcohol use: No   Drug use: No   Sexual activity: Never  Other Topics Concern   Not on file  Social History Narrative   Patient is married(James) and lives at home with her husband.   Patient has 5 children.   Patient works full-time.   Patient has a 10th  Grade education.  Patient is right handed.   Patient drinks 2-3 cups of coffee daily.   Social Determinants of Health   Financial Resource Strain: Not on file  Food Insecurity: Not on file  Transportation Needs: Not on file  Physical Activity: Not on file  Stress: Not on file  Social Connections: Not on file  Intimate Partner Violence: Not on file    FAMILY HISTORY: Family History  Problem Relation Age of Onset   Hypertension Mother    Cancer Father        lung   Hypertension Sister    Hypertension Brother      ALLERGIES:  is allergic to mold extract [trichophyton] and pollen extract.  MEDICATIONS:  Current Outpatient Medications  Medication Sig Dispense Refill   amLODipine (NORVASC) 2.5 MG tablet Take 1 tablet (2.5 mg total) by mouth daily. 90 tablet 3   aspirin 81 MG chewable tablet Chew 1 tablet (81 mg total) by mouth 2 (two) times daily. 100 tablet 0   B Complex-Folic Acid (B COMPLEX PLUS) TABS Take 1 tablet by mouth daily. 100 tablet 3   Cholecalciferol (VITAMIN D3) 50 MCG (2000 UT) capsule Take 1 capsule (2,000 Units total) by mouth daily. 100 capsule 3   donepezil (ARICEPT) 10 MG tablet Take 1 tablet (10 mg total) by mouth at bedtime. 90 tablet 3   gabapentin (NEURONTIN) 100 MG capsule Take 1 capsule (100 mg total) by mouth 2 (two) times daily. 180 capsule 1   hydroxyurea (HYDREA) 500 MG capsule Take 1 capsule (500 mg total) by mouth daily. May take with food to minimize GI side effects. 90 capsule 1   ibuprofen (ADVIL,MOTRIN) 200 MG tablet Take 200 mg by mouth every 6 (six) hours as needed for mild pain.     metoprolol tartrate (LOPRESSOR) 25 MG tablet Take 1 tablet (25 mg total) by mouth 2 (two) times daily. 180 tablet 1   OLANZapine (ZYPREXA) 2.5 MG tablet Take 1 tablet (2.5 mg total) by mouth at bedtime. 90 tablet 1   No current facility-administered medications for this visit.    REVIEW OF SYSTEMS:   Constitutional: ( - ) fevers, ( - )  chills , ( - ) night sweats Eyes: ( - ) blurriness of vision, ( - ) double vision, ( - ) watery eyes Ears, nose, mouth, throat, and face: ( - ) mucositis, ( - ) sore throat Respiratory: ( - ) cough, ( - ) dyspnea, ( - ) wheezes Cardiovascular: ( - ) palpitation, ( - ) chest discomfort, ( - ) lower extremity swelling Gastrointestinal:  ( - ) nausea, ( - ) heartburn, ( - ) change in bowel habits Skin: ( - ) abnormal skin rashes Lymphatics: ( - ) new lymphadenopathy, ( - ) easy bruising Neurological: ( - ) numbness, ( - ) tingling, ( - ) new  weaknesses Behavioral/Psych: ( - ) mood change, ( - ) new changes  All other systems were reviewed with the patient and are negative.  PHYSICAL EXAMINATION: ECOG PERFORMANCE STATUS: 0 - Asymptomatic  There were no vitals filed for this visit.  There were no vitals filed for this visit.   GENERAL: Well-appearing elderly African-American female, alert, no distress and comfortable SKIN: skin color, texture, turgor are normal, no rashes or significant lesions EYES: conjunctiva are pink and non-injected, sclera clear LUNGS: clear to auscultation and percussion with normal breathing effort HEART: regular rate & rhythm and no murmurs and no lower extremity edema Musculoskeletal: no cyanosis of digits  and no clubbing  PSYCH: alert & oriented x 3, fluent speech NEURO: no focal motor/sensory deficits  LABORATORY DATA:  I have reviewed the data as listed    Latest Ref Rng & Units 10/15/2022    9:49 AM 08/25/2022    9:33 AM 07/27/2022    2:33 PM  CBC  WBC 4.0 - 10.5 K/uL 9.4  8.8  10.4   Hemoglobin 12.0 - 15.0 g/dL 14.1  14.7  14.1   Hematocrit 36.0 - 46.0 % 43.3  47.6  42.7   Platelets 150 - 400 K/uL 813  771  820        Latest Ref Rng & Units 10/15/2022    9:49 AM 07/27/2022    2:33 PM 07/01/2022    3:37 PM  CMP  Glucose 70 - 99 mg/dL 81  98  81   BUN 8 - 23 mg/dL _0 Creatinine 0.44 - 1.00 mg/dL 0.94  0.94  0.94   Sodium 135 - 145 mmol/L 142  141  141   Potassium 3.5 - 5.1 mmol/L 4.3  4.1  3.6   Chloride 98 - 111 mmol/L 105  106  105   CO2 22 - 32 mmol/L 32  31  29   Calcium 8.9 - 10.3 mg/dL 9.5  9.7  9.6   Total Protein 6.5 - 8.1 g/dL 7.0  7.3  6.9    6.9   Total Bilirubin 0.3 - 1.2 mg/dL 0.4  0.4  0.3    0.3   Alkaline Phos 38 - 126 U/L 53  50  51    51   AST 15 - 41 U/L _1 ALT 0 - 44 U/L _2 RADIOGRAPHIC STUDIES: No results found.  ASSESSMENT & PLAN Janet Sherman 71 y.o. female with medical history significant for essential  thrombocytosis who presents for a follow up visit.   Today we discussed the diagnosis of essential thrombocytosis.  Essential thrombocytosis and myeloproliferative neoplasm by the patient has overproduction of a particular blood cell.  In this case the patient has a JAK2 mutation which is causing thrombocytosis.  Goals of treatment including monitoring for progression to myelofibrosis and reducing risk of VTE.  Recommend aspirin 81 mg p.o. daily and starting cytoreductive therapy with hydroxyurea 500 mg p.o. daily.  The patient voiced understanding of the risks and benefits of this treatment.  We will plan to see her back in 4 weeks time to reevaluate.  #Essential Thrombocytosis, JAK2 Positive -- Diagnosis confirmed by the presence of JAK2 and bone marrow biopsy showing myeloproliferative neoplasm. -- Patient currently on 81 mg baby aspirin daily, recommend continuation of this -- We will start hydroxyurea 500 mg p.o. daily for cytoreductive therapy. -- Labs today show white blood cell count 9.4, hemoglobin 14.1, MCV 78.6, and platelets of 813 -- Return to clinic in 4 weeks time for evaluation on hydroxyurea therapy.  No orders of the defined types were placed in this encounter.   All questions were answered. The patient knows to call the clinic with any problems, questions or concerns.  A total of more than 30 minutes were spent on this encounter with face-to-face time and non-face-to-face time, including preparing to see the patient, ordering tests and/or medications, counseling the patient and coordination of care as outlined above.   Ledell Peoples, MD Department of Hematology/Oncology [facility]  [place] [facility] Phone: [phone] Pager: [phone] Email: [email or web address]  11/17/2022 5:30 PM

## 2022-11-18 ENCOUNTER — Inpatient Hospital Stay: Payer: Medicare Other | Attending: Hematology and Oncology

## 2022-11-18 ENCOUNTER — Telehealth: Payer: Self-pay | Admitting: Hematology and Oncology

## 2022-11-18 ENCOUNTER — Other Ambulatory Visit: Payer: Self-pay

## 2022-11-18 ENCOUNTER — Inpatient Hospital Stay: Payer: Medicare Other | Admitting: Hematology and Oncology

## 2022-11-18 VITALS — BP 150/94 | HR 91 | Temp 97.7°F | Resp 16 | Ht 65.0 in | Wt 124.7 lb

## 2022-11-18 DIAGNOSIS — Z87891 Personal history of nicotine dependence: Secondary | ICD-10-CM | POA: Diagnosis not present

## 2022-11-18 DIAGNOSIS — D473 Essential (hemorrhagic) thrombocythemia: Secondary | ICD-10-CM

## 2022-11-18 DIAGNOSIS — D75839 Thrombocytosis, unspecified: Secondary | ICD-10-CM | POA: Insufficient documentation

## 2022-11-18 LAB — CBC WITH DIFFERENTIAL (CANCER CENTER ONLY)
Abs Immature Granulocytes: 0.01 10*3/uL (ref 0.00–0.07)
Basophils Absolute: 0.1 10*3/uL (ref 0.0–0.1)
Basophils Relative: 1 %
Eosinophils Absolute: 0.5 10*3/uL (ref 0.0–0.5)
Eosinophils Relative: 6 %
HCT: 41.1 % (ref 36.0–46.0)
Hemoglobin: 13.6 g/dL (ref 12.0–15.0)
Immature Granulocytes: 0 %
Lymphocytes Relative: 16 %
Lymphs Abs: 1.4 10*3/uL (ref 0.7–4.0)
MCH: 26.9 pg (ref 26.0–34.0)
MCHC: 33.1 g/dL (ref 30.0–36.0)
MCV: 81.2 fL (ref 80.0–100.0)
Monocytes Absolute: 0.6 10*3/uL (ref 0.1–1.0)
Monocytes Relative: 7 %
Neutro Abs: 5.9 10*3/uL (ref 1.7–7.7)
Neutrophils Relative %: 70 %
Platelet Count: 615 10*3/uL — ABNORMAL HIGH (ref 150–400)
RBC: 5.06 MIL/uL (ref 3.87–5.11)
RDW: 15.9 % — ABNORMAL HIGH (ref 11.5–15.5)
WBC Count: 8.4 10*3/uL (ref 4.0–10.5)
nRBC: 0 % (ref 0.0–0.2)

## 2022-11-18 LAB — CMP (CANCER CENTER ONLY)
ALT: 7 U/L (ref 0–44)
AST: 15 U/L (ref 15–41)
Albumin: 4.3 g/dL (ref 3.5–5.0)
Alkaline Phosphatase: 57 U/L (ref 38–126)
Anion gap: 6 (ref 5–15)
BUN: 14 mg/dL (ref 8–23)
CO2: 31 mmol/L (ref 22–32)
Calcium: 9.7 mg/dL (ref 8.9–10.3)
Chloride: 106 mmol/L (ref 98–111)
Creatinine: 0.86 mg/dL (ref 0.44–1.00)
GFR, Estimated: >60 mL/min (ref >60)
Glucose, Bld: 108 mg/dL — ABNORMAL HIGH (ref 70–99)
Potassium: 4.1 mmol/L (ref 3.5–5.1)
Sodium: 143 mmol/L (ref 135–145)
Total Bilirubin: 0.3 mg/dL (ref 0.3–1.2)
Total Protein: 7.2 g/dL (ref 6.5–8.1)

## 2022-11-18 MED ORDER — HYDROXYUREA 500 MG PO CAPS: 500.0000 mg | ORAL_CAPSULE | Freq: Two times a day (BID) | ORAL | 0 refills | Status: DC

## 2022-11-18 NOTE — Telephone Encounter (Signed)
Per 11/22 los called and spoke to pt daughter about pt appointment

## 2022-11-18 NOTE — Progress Notes (Signed)
[place] Telephone:(336) [phone]   Fax:([phone]  PROGRESS NOTE  Patient Care Team: Plotnikov, Evie Lacks, MD as PCP - General (Internal Medicine)  Hematological/Oncological History # Essential Thrombocytosis, JAK2 positive  11/13/2021: WBC 12.5, Hgb 14.5, MCV 79.1, Plt 852 07/01/2022: WBC 10.2, Hgb 13.2, MCV 79.5, Plt 817 07/27/2022: establish care with Dr. Lorenso Courier  10/15/2022: start of hydroxyurea 500 mg PO daily.   Interval History:  Janet Sherman 71 y.o. female with medical history significant for essential thrombocytosis who presents for a follow up visit. The patient's last visit was on 10/15/2022. In the interim since the last visit she started hydroxyurea 500 mg PO daily.   On exam today Janet Sherman reports that she is tolerating her hydroxyurea therapy well without any difficulty.  She is not having any nausea, vomiting, or diarrhea.  She reports is not having any mouth ulcers or leg ulcers.  She notes that she is not having any signs or symptoms concerning for clot including leg swelling, leg pain, chest pain, shortness of breath.  She notes that she does have some occasional sinus pressure but it is unrelated to her current medication.  She notes her appetite is been "so-so and that it comes and goes.  Her energy today is quite good at the 10 out of 10.  She is not having any trouble with bleeding, bruising, or dark stools.  Overall she is willing and able to proceed with hydroxyurea therapy at this time.  A full 10 point ROS was otherwise negative.   MEDICAL HISTORY:  Past Medical History:  Diagnosis Date   Arthritis    "right hand" (01/22/2017)   Gallstones    History of kidney stones    Hypertension    Migraine    "a few/year" (01/22/2017)   Pneumonia X 1    SURGICAL HISTORY: Past Surgical History:  Procedure Laterality Date   CRANIOTOMY N/A 10/16/2016   Procedure: Pituitary tumor (benign) resection - CRANIOTOMY HYPOPHYSECTOMY TRANSNASAL APPROACH;   Surgeon: Consuella Lose, MD;  Location: [place];  Service: Neurosurgery;  Laterality: N/A;   ELECTROPHYSIOLOGIC STUDY N/A 01/22/2017   Procedure: SVT Ablation;  Surgeon: Will Meredith Leeds, MD;  Location: [place] [facility];  Service: Cardiovascular;  Laterality: N/A;   ELECTROPHYSIOLOGIC STUDY N/A 01/22/2017   Procedure: Electrophysiology Study;  Surgeon: Will Meredith Leeds, MD;  Location: [place] [facility];  Service: Cardiovascular;  Laterality: N/A;   gallstones  ~ 2000   KIDNEY STONE SURGERY     "cut me open"   SUPRAVENTRICULAR TACHYCARDIA ABLATION  01/22/2017   TRANSNASAL APPROACH N/A 10/16/2016   Procedure: TRANSNASAL APPROACH WITH FUSION;  Surgeon: Jerrell Belfast, MD;  Location: [facility];  Service: ENT;  Laterality: N/A;   TUBAL LIGATION      SOCIAL HISTORY: Social History   Socioeconomic History   Marital status: Widowed    Spouse name: Jeneen Rinks   Number of children: 5   Years of education: 10   Highest education level: Not on file  Occupational History    Employer: [employer]    Comment: [employer]  Tobacco Use   Smoking status: Former    Packs/day: 1.00    Types: Cigarettes    Quit date: 10/16/2016    Years since quitting: 6.0   Smokeless tobacco: Never  Vaping Use   Vaping Use: Never used  Substance and Sexual Activity   Alcohol use: No   Drug use: No   Sexual activity: Never  Other  Topics Concern   Not on file  Social History Narrative   Patient is married(James) and lives at home with her husband.   Patient has 5 children.   Patient works full-time.   Patient has a 10th  Grade education.   Patient is right handed.   Patient drinks 2-3 cups of coffee daily.   Social Determinants of Health   Financial Resource Strain: Not on file  Food Insecurity: Not on file  Transportation Needs: Not on file  Physical Activity: Not on file  Stress: Not on file  Social Connections: Not on file  Intimate Partner Violence: Not on file     FAMILY HISTORY: Family History  Problem Relation Age of Onset   Hypertension Mother    Cancer Father        lung   Hypertension Sister    Hypertension Brother     ALLERGIES:  is allergic to mold extract [trichophyton] and pollen extract.  MEDICATIONS:  Current Outpatient Medications  Medication Sig Dispense Refill   amLODipine (NORVASC) 2.5 MG tablet Take 1 tablet (2.5 mg total) by mouth daily. 90 tablet 3   aspirin 81 MG chewable tablet Chew 1 tablet (81 mg total) by mouth 2 (two) times daily. 100 tablet 0   B Complex-Folic Acid (B COMPLEX PLUS) TABS Take 1 tablet by mouth daily. 100 tablet 3   Cholecalciferol (VITAMIN D3) 50 MCG (2000 UT) capsule Take 1 capsule (2,000 Units total) by mouth daily. 100 capsule 3   donepezil (ARICEPT) 10 MG tablet Take 1 tablet (10 mg total) by mouth at bedtime. 90 tablet 3   gabapentin (NEURONTIN) 100 MG capsule Take 1 capsule (100 mg total) by mouth 2 (two) times daily. 180 capsule 1   hydroxyurea (HYDREA) 500 MG capsule Take 1 capsule (500 mg total) by mouth 2 (two) times daily. May take with food to minimize GI side effects. 180 capsule 0   ibuprofen (ADVIL,MOTRIN) 200 MG tablet Take 200 mg by mouth every 6 (six) hours as needed for mild pain.     metoprolol tartrate (LOPRESSOR) 25 MG tablet Take 1 tablet (25 mg total) by mouth 2 (two) times daily. 180 tablet 1   OLANZapine (ZYPREXA) 2.5 MG tablet Take 1 tablet (2.5 mg total) by mouth at bedtime. 90 tablet 1   No current facility-administered medications for this visit.    REVIEW OF SYSTEMS:   Constitutional: ( - ) fevers, ( - )  chills , ( - ) night sweats Eyes: ( - ) blurriness of vision, ( - ) double vision, ( - ) watery eyes Ears, nose, mouth, throat, and face: ( - ) mucositis, ( - ) sore throat Respiratory: ( - ) cough, ( - ) dyspnea, ( - ) wheezes Cardiovascular: ( - ) palpitation, ( - ) chest discomfort, ( - ) lower extremity swelling Gastrointestinal:  ( - ) nausea, ( - )  heartburn, ( - ) change in bowel habits Skin: ( - ) abnormal skin rashes Lymphatics: ( - ) new lymphadenopathy, ( - ) easy bruising Neurological: ( - ) numbness, ( - ) tingling, ( - ) new weaknesses Behavioral/Psych: ( - ) mood change, ( - ) new changes  All other systems were reviewed with the patient and are negative.  PHYSICAL EXAMINATION: ECOG PERFORMANCE STATUS: 0 - Asymptomatic  Vitals:   11/18/22 1448  BP: (!) 150/94  Pulse: 91  Resp: 16  Temp: 97.7 F (36.5 C)  SpO2: 99%    Filed Weights  11/18/22 1448  Weight: 124 lb 11.2 oz (56.6 kg)     GENERAL: Well-appearing elderly African-American female, alert, no distress and comfortable SKIN: skin color, texture, turgor are normal, no rashes or significant lesions EYES: conjunctiva are pink and non-injected, sclera clear LUNGS: clear to auscultation and percussion with normal breathing effort HEART: regular rate & rhythm and no murmurs and no lower extremity edema Musculoskeletal: no cyanosis of digits and no clubbing  PSYCH: alert & oriented x 3, fluent speech NEURO: no focal motor/sensory deficits  LABORATORY DATA:  I have reviewed the data as listed    Latest Ref Rng & Units 11/18/2022    2:23 PM 10/15/2022    9:49 AM 08/25/2022    9:33 AM  CBC  WBC 4.0 - 10.5 K/uL 8.4  9.4  8.8   Hemoglobin 12.0 - 15.0 g/dL 13.6  14.1  14.7   Hematocrit 36.0 - 46.0 % 41.1  43.3  47.6   Platelets 150 - 400 K/uL 615  813  771        Latest Ref Rng & Units 11/18/2022    2:23 PM 10/15/2022    9:49 AM 07/27/2022    2:33 PM  CMP  Glucose 70 - 99 mg/dL 108  81  98   BUN 8 - 23 mg/dL _0 Creatinine 0.44 - 1.00 mg/dL 0.86  0.94  0.94   Sodium 135 - 145 mmol/L 143  142  141   Potassium 3.5 - 5.1 mmol/L 4.1  4.3  4.1   Chloride 98 - 111 mmol/L 106  105  106   CO2 22 - 32 mmol/L 31  32  31   Calcium 8.9 - 10.3 mg/dL 9.7  9.5  9.7   Total Protein 6.5 - 8.1 g/dL 7.2  7.0  7.3   Total Bilirubin 0.3 - 1.2 mg/dL 0.3  0.4   0.4   Alkaline Phos 38 - 126 U/L 57  53  50   AST 15 - 41 U/L _1 ALT 0 - 44 U/L _2 RADIOGRAPHIC STUDIES: No results found.  ASSESSMENT & PLAN Janet Sherman 71 y.o. female with medical history significant for essential thrombocytosis who presents for a follow up visit.   Today we discussed the diagnosis of essential thrombocytosis.  Essential thrombocytosis and myeloproliferative neoplasm by the patient has overproduction of a particular blood cell.  In this case the patient has a JAK2 mutation which is causing thrombocytosis.  Goals of treatment including monitoring for progression to myelofibrosis and reducing risk of VTE.  Recommend aspirin 81 mg p.o. daily and starting cytoreductive therapy with hydroxyurea 500 mg p.o. daily.  The patient voiced understanding of the risks and benefits of this treatment.  We will plan to see her back in 4 weeks time to reevaluate.  #Essential Thrombocytosis, JAK2 Positive -- Diagnosis confirmed by the presence of JAK2 and bone marrow biopsy showing myeloproliferative neoplasm. -- Patient currently on 81 mg baby aspirin daily, recommend continuation of this -- today will increase dosage to hydroxyurea 500 mg BID for cytoreductive therapy. -- Labs today show white blood cell count 8.4, hemoglobin 13.6, MCV 81.2, and platelets of 615 -- Return to clinic in 4 weeks time for evaluation on hydroxyurea therapy.  No orders of the defined types were placed in this encounter.   All questions were answered. The patient knows to call the clinic with any  problems, questions or concerns.  A total of more than 30 minutes were spent on this encounter with face-to-face time and non-face-to-face time, including preparing to see the patient, ordering tests and/or medications, counseling the patient and coordination of care as outlined above.   Ledell Peoples, MD Department of Hematology/Oncology [place] [facility] Phone: [phone] Pager: [phone] Email: [email or web address]  11/18/2022 3:51 PM

## 2022-12-17 ENCOUNTER — Encounter: Payer: Self-pay | Admitting: Internal Medicine

## 2022-12-17 ENCOUNTER — Other Ambulatory Visit: Payer: Self-pay

## 2022-12-17 ENCOUNTER — Inpatient Hospital Stay (HOSPITAL_BASED_OUTPATIENT_CLINIC_OR_DEPARTMENT_OTHER): Payer: Medicare Other | Admitting: Hematology and Oncology

## 2022-12-17 ENCOUNTER — Other Ambulatory Visit: Payer: Self-pay | Admitting: Hematology and Oncology

## 2022-12-17 ENCOUNTER — Inpatient Hospital Stay: Payer: Medicare Other | Attending: Hematology and Oncology

## 2022-12-17 VITALS — BP 90/62 | HR 93 | Temp 97.3°F | Resp 18 | Wt 117.0 lb

## 2022-12-17 DIAGNOSIS — D75839 Thrombocytosis, unspecified: Secondary | ICD-10-CM

## 2022-12-17 DIAGNOSIS — D473 Essential (hemorrhagic) thrombocythemia: Secondary | ICD-10-CM | POA: Insufficient documentation

## 2022-12-17 LAB — CBC WITH DIFFERENTIAL (CANCER CENTER ONLY)
Abs Immature Granulocytes: 0.04 10*3/uL (ref 0.00–0.07)
Basophils Absolute: 0 10*3/uL (ref 0.0–0.1)
Basophils Relative: 0 %
Eosinophils Absolute: 0 10*3/uL (ref 0.0–0.5)
Eosinophils Relative: 1 %
HCT: 36.2 % (ref 36.0–46.0)
Hemoglobin: 12.3 g/dL (ref 12.0–15.0)
Immature Granulocytes: 1 %
Lymphocytes Relative: 7 %
Lymphs Abs: 0.6 10*3/uL — ABNORMAL LOW (ref 0.7–4.0)
MCH: 27.4 pg (ref 26.0–34.0)
MCHC: 34 g/dL (ref 30.0–36.0)
MCV: 80.6 fL (ref 80.0–100.0)
Monocytes Absolute: 0.3 10*3/uL (ref 0.1–1.0)
Monocytes Relative: 4 %
Neutro Abs: 7.4 10*3/uL (ref 1.7–7.7)
Neutrophils Relative %: 87 %
Platelet Count: 565 10*3/uL — ABNORMAL HIGH (ref 150–400)
RBC: 4.49 MIL/uL (ref 3.87–5.11)
RDW: 17.9 % — ABNORMAL HIGH (ref 11.5–15.5)
WBC Count: 8.4 10*3/uL (ref 4.0–10.5)
nRBC: 0 % (ref 0.0–0.2)

## 2022-12-17 LAB — CMP (CANCER CENTER ONLY)
ALT: 7 U/L (ref 0–44)
AST: 16 U/L (ref 15–41)
Albumin: 3.5 g/dL (ref 3.5–5.0)
Alkaline Phosphatase: 55 U/L (ref 38–126)
Anion gap: 9 (ref 5–15)
BUN: 18 mg/dL (ref 8–23)
CO2: 27 mmol/L (ref 22–32)
Calcium: 9.3 mg/dL (ref 8.9–10.3)
Chloride: 100 mmol/L (ref 98–111)
Creatinine: 1.13 mg/dL — ABNORMAL HIGH (ref 0.44–1.00)
GFR, Estimated: 52 mL/min — ABNORMAL LOW (ref >60)
Glucose, Bld: 107 mg/dL — ABNORMAL HIGH (ref 70–99)
Potassium: 2.9 mmol/L — ABNORMAL LOW (ref 3.5–5.1)
Sodium: 136 mmol/L (ref 135–145)
Total Bilirubin: 0.7 mg/dL (ref 0.3–1.2)
Total Protein: 7.2 g/dL (ref 6.5–8.1)

## 2022-12-17 MED ORDER — POTASSIUM CHLORIDE CRYS ER 20 MEQ PO TBCR: 20.0000 meq | EXTENDED_RELEASE_TABLET | Freq: Two times a day (BID) | ORAL | 0 refills | Status: DC

## 2022-12-17 NOTE — Progress Notes (Signed)
[place] Telephone:(336) [phone]   Fax:(336) [phone]  PROGRESS NOTE  Patient Care Team: Plotnikov, Evie Lacks, MD as PCP - General (Internal Medicine)  Hematological/Oncological History # Essential Thrombocytosis, JAK2 positive  11/13/2021: WBC 12.5, Hgb 14.5, MCV 79.1, Plt 852 07/01/2022: WBC 10.2, Hgb 13.2, MCV 79.5, Plt 817 07/27/2022: establish care with Dr. Lorenso Courier  10/15/2022: start of hydroxyurea 500 mg PO daily.  11/18/2022: increase to hydroxyurea 500 mg BID 12/17/2022: WBC 8.4, Hgb 12.3, MCV 80.6, Plt 565  Interval History:  Janet Sherman 71 y.o. female with medical history significant for essential thrombocytosis who presents for a follow up visit. The patient's last visit was on 11/18/2022. In the interim since the last visit she has continued hydroxyurea 500 mg PO BID.   On exam today Janet Sherman is accompanied by her daughter.  She reports she has been taking the hydroxyurea faithfully, though she occasionally misses some dosages.  Her daughter recently started helping her with her medications and has been looking after her for the last few weeks.  She cannot verify that her mother has been taking the dosages appropriately.  She notes that she is concerned a few times her mom may have "taken too many".  She notes that she does have occasional issues with headaches and bodyaches.  She is not having any vomiting or diarrhea but does have some occasional nausea.  She has been taking the medication with food and has not developed any oral ulcers or ulcers on her ankle.  She is not having any trouble with bleeding, bruising, or dark stools.  Overall she is willing and able to proceed with hydroxyurea therapy at this time.  A full 10 point ROS was otherwise negative.  After discussion with the daughter she would like to continue with the current dosage while monitoring her mom to assure that she is taking twice daily as prescribed.   MEDICAL HISTORY:  Past Medical  History:  Diagnosis Date   Arthritis    "right hand" (01/22/2017)   Gallstones    History of kidney stones    Hypertension    Migraine    "a few/year" (01/22/2017)   Pneumonia X 1    SURGICAL HISTORY: Past Surgical History:  Procedure Laterality Date   CRANIOTOMY N/A 10/16/2016   Procedure: Pituitary tumor (benign) resection - CRANIOTOMY HYPOPHYSECTOMY TRANSNASAL APPROACH;  Surgeon: Consuella Lose, MD;  Location: [place];  Service: Neurosurgery;  Laterality: N/A;   ELECTROPHYSIOLOGIC STUDY N/A 01/22/2017   Procedure: SVT Ablation;  Surgeon: Will Meredith Leeds, MD;  Location: [place] [facility];  Service: Cardiovascular;  Laterality: N/A;   ELECTROPHYSIOLOGIC STUDY N/A 01/22/2017   Procedure: Electrophysiology Study;  Surgeon: Will Meredith Leeds, MD;  Location: [place] [facility];  Service: Cardiovascular;  Laterality: N/A;   gallstones  ~ 2000   KIDNEY STONE SURGERY     "cut me open"   SUPRAVENTRICULAR TACHYCARDIA ABLATION  01/22/2017   TRANSNASAL APPROACH N/A 10/16/2016   Procedure: TRANSNASAL APPROACH WITH FUSION;  Surgeon: Jerrell Belfast, MD;  Location: [facility] OR;  Service: ENT;  Laterality: N/A;   TUBAL LIGATION      SOCIAL HISTORY: Social History   Socioeconomic History   Marital status: Widowed    Spouse name: Jeneen Rinks   Number of children: 5   Years of education: 10   Highest education level: Not on file  Occupational History    Employer: [employer]    Comment: [place]  Tobacco Use  Smoking status: Former    Packs/day: 1.00    Types: Cigarettes    Quit date: 10/16/2016    Years since quitting: 6.1   Smokeless tobacco: Never  Vaping Use   Vaping Use: Never used  Substance and Sexual Activity   Alcohol use: No   Drug use: No   Sexual activity: Never  Other Topics Concern   Not on file  Social History Narrative   Patient is married(James) and lives at home with her husband.   Patient has 5 children.   Patient works full-time.    Patient has a 10th  Grade education.   Patient is right handed.   Patient drinks 2-3 cups of coffee daily.   Social Determinants of Health   Financial Resource Strain: Not on file  Food Insecurity: Not on file  Transportation Needs: Not on file  Physical Activity: Not on file  Stress: Not on file  Social Connections: Not on file  Intimate Partner Violence: Not on file    FAMILY HISTORY: Family History  Problem Relation Age of Onset   Hypertension Mother    Cancer Father        lung   Hypertension Sister    Hypertension Brother     ALLERGIES:  is allergic to mold extract [trichophyton] and pollen extract.  MEDICATIONS:  Current Outpatient Medications  Medication Sig Dispense Refill   potassium chloride SA (KLOR-CON M) 20 MEQ tablet Take 1 tablet (20 mEq total) by mouth 2 (two) times daily. 60 tablet 0   amLODipine (NORVASC) 2.5 MG tablet Take 1 tablet (2.5 mg total) by mouth daily. 90 tablet 3   aspirin 81 MG chewable tablet Chew 1 tablet (81 mg total) by mouth 2 (two) times daily. 100 tablet 0   B Complex-Folic Acid (B COMPLEX PLUS) TABS Take 1 tablet by mouth daily. 100 tablet 3   Cholecalciferol (VITAMIN D3) 50 MCG (2000 UT) capsule Take 1 capsule (2,000 Units total) by mouth daily. 100 capsule 3   donepezil (ARICEPT) 10 MG tablet Take 1 tablet (10 mg total) by mouth at bedtime. 90 tablet 3   gabapentin (NEURONTIN) 100 MG capsule Take 1 capsule (100 mg total) by mouth 2 (two) times daily. 180 capsule 1   hydroxyurea (HYDREA) 500 MG capsule Take 1 capsule (500 mg total) by mouth 2 (two) times daily. May take with food to minimize GI side effects. 180 capsule 0   ibuprofen (ADVIL,MOTRIN) 200 MG tablet Take 200 mg by mouth every 6 (six) hours as needed for mild pain.     metoprolol tartrate (LOPRESSOR) 25 MG tablet Take 1 tablet (25 mg total) by mouth 2 (two) times daily. 180 tablet 1   OLANZapine (ZYPREXA) 2.5 MG tablet Take 1 tablet (2.5 mg total) by mouth at bedtime. 90  tablet 1   No current facility-administered medications for this visit.    REVIEW OF SYSTEMS:   Constitutional: ( - ) fevers, ( - )  chills , ( - ) night sweats Eyes: ( - ) blurriness of vision, ( - ) double vision, ( - ) watery eyes Ears, nose, mouth, throat, and face: ( - ) mucositis, ( - ) sore throat Respiratory: ( - ) cough, ( - ) dyspnea, ( - ) wheezes Cardiovascular: ( - ) palpitation, ( - ) chest discomfort, ( - ) lower extremity swelling Gastrointestinal:  ( - ) nausea, ( - ) heartburn, ( - ) change in bowel habits Skin: ( - ) abnormal skin rashes  Lymphatics: ( - ) new lymphadenopathy, ( - ) easy bruising Neurological: ( - ) numbness, ( - ) tingling, ( - ) new weaknesses Behavioral/Psych: ( - ) mood change, ( - ) new changes  All other systems were reviewed with the patient and are negative.  PHYSICAL EXAMINATION: ECOG PERFORMANCE STATUS: 0 - Asymptomatic  Vitals:   12/17/22 1411  BP: 90/62  Pulse: 93  Resp: 18  Temp: (!) 97.3 F (36.3 C)  SpO2: 98%    Filed Weights   12/17/22 1411  Weight: 117 lb (53.1 kg)     GENERAL: Well-appearing elderly African-American female, alert, no distress and comfortable SKIN: skin color, texture, turgor are normal, no rashes or significant lesions EYES: conjunctiva are pink and non-injected, sclera clear LUNGS: clear to auscultation and percussion with normal breathing effort HEART: regular rate & rhythm and no murmurs and no lower extremity edema Musculoskeletal: no cyanosis of digits and no clubbing  PSYCH: alert & oriented x 3, fluent speech NEURO: no focal motor/sensory deficits  LABORATORY DATA:  I have reviewed the data as listed    Latest Ref Rng & Units 12/17/2022    1:35 PM 11/18/2022    2:23 PM 10/15/2022    9:49 AM  CBC  WBC 4.0 - 10.5 K/uL 8.4  8.4  9.4   Hemoglobin 12.0 - 15.0 g/dL 12.3  13.6  14.1   Hematocrit 36.0 - 46.0 % 36.2  41.1  43.3   Platelets 150 - 400 K/uL 565  615  813        Latest Ref Rng  & Units 12/17/2022    1:35 PM 11/18/2022    2:23 PM 10/15/2022    9:49 AM  CMP  Glucose 70 - 99 mg/dL 107  108  81   BUN 8 - 23 mg/dL _0 Creatinine 0.44 - 1.00 mg/dL 1.13  0.86  0.94   Sodium 135 - 145 mmol/L 136  143  142   Potassium 3.5 - 5.1 mmol/L 2.9  4.1  4.3   Chloride 98 - 111 mmol/L 100  106  105   CO2 22 - 32 mmol/L 27  31  32   Calcium 8.9 - 10.3 mg/dL 9.3  9.7  9.5   Total Protein 6.5 - 8.1 g/dL 7.2  7.2  7.0   Total Bilirubin 0.3 - 1.2 mg/dL 0.7  0.3  0.4   Alkaline Phos 38 - 126 U/L 55  57  53   AST 15 - 41 U/L _1 ALT 0 - 44 U/L _2 RADIOGRAPHIC STUDIES: No results found.  ASSESSMENT & PLAN Janet Sherman 71 y.o. female with medical history significant for essential thrombocytosis who presents for a follow up visit.   Today we discussed the diagnosis of essential thrombocytosis.  Essential thrombocytosis and myeloproliferative neoplasm by the patient has overproduction of a particular blood cell.  In this case the patient has a JAK2 mutation which is causing thrombocytosis.  Goals of treatment including monitoring for progression to myelofibrosis and reducing risk of VTE.  Recommend aspirin 81 mg p.o. daily and starting cytoreductive therapy with hydroxyurea 500 mg p.o. daily.  The patient voiced understanding of the risks and benefits of this treatment.  We will plan to see her back in 4 weeks time to reevaluate.  #Essential Thrombocytosis, JAK2 Positive -- Diagnosis confirmed by the presence of JAK2 and bone marrow  biopsy showing myeloproliferative neoplasm. -- Patient currently on 81 mg baby aspirin daily, recommend continuation of this -- today will increase dosage to hydroxyurea 500 mg in AM and 1000 mg in PM for cytoreductive therapy. -- Labs today show white blood cell count 8.4, hemoglobin 12.3, MCV 80.6, and platelets of 565 -- Return to clinic in 4 weeks time for evaluation on hydroxyurea therapy.  No orders of the defined types  were placed in this encounter.   All questions were answered. The patient knows to call the clinic with any problems, questions or concerns.  A total of more than 30 minutes were spent on this encounter with face-to-face time and non-face-to-face time, including preparing to see the patient, ordering tests and/or medications, counseling the patient and coordination of care as outlined above.   Ledell Peoples, MD Department of Hematology/Oncology [place] at [facility] Phone: [phone] Pager: [phone] Email: [email or web address]  12/19/2022 2:09 PM

## 2022-12-25 ENCOUNTER — Encounter: Payer: Self-pay | Admitting: Internal Medicine

## 2022-12-25 ENCOUNTER — Other Ambulatory Visit: Payer: Self-pay | Admitting: Internal Medicine

## 2022-12-25 MED ORDER — POLYMYXIN B-TRIMETHOPRIM 10000-0.1 UNIT/ML-% OP SOLN: 1.0000 [drp] | OPHTHALMIC | 0 refills | Status: AC

## 2023-01-01 ENCOUNTER — Encounter: Payer: Self-pay | Admitting: Internal Medicine

## 2023-01-01 ENCOUNTER — Ambulatory Visit (INDEPENDENT_AMBULATORY_CARE_PROVIDER_SITE_OTHER): Payer: Medicare Other

## 2023-01-01 ENCOUNTER — Ambulatory Visit (INDEPENDENT_AMBULATORY_CARE_PROVIDER_SITE_OTHER): Payer: Medicare Other | Admitting: Internal Medicine

## 2023-01-01 VITALS — BP 92/74 | HR 104 | Temp 97.8°F | Ht 65.0 in | Wt 112.0 lb

## 2023-01-01 DIAGNOSIS — F4321 Adjustment disorder with depressed mood: Secondary | ICD-10-CM

## 2023-01-01 DIAGNOSIS — R400 Somnolence: Secondary | ICD-10-CM

## 2023-01-01 DIAGNOSIS — R053 Chronic cough: Secondary | ICD-10-CM

## 2023-01-01 DIAGNOSIS — F02818 Dementia in other diseases classified elsewhere, unspecified severity, with other behavioral disturbance: Secondary | ICD-10-CM

## 2023-01-01 DIAGNOSIS — I10 Essential (primary) hypertension: Secondary | ICD-10-CM | POA: Diagnosis not present

## 2023-01-01 DIAGNOSIS — G309 Alzheimer's disease, unspecified: Secondary | ICD-10-CM

## 2023-01-01 MED ORDER — FLUOXETINE HCL 10 MG PO CAPS: 10.0000 mg | ORAL_CAPSULE | Freq: Every day | ORAL | 5 refills | Status: DC

## 2023-01-01 NOTE — Assessment & Plan Note (Signed)
start Fluoxtine

## 2023-01-01 NOTE — Progress Notes (Signed)
Subjective:  Patient ID: Janet Sherman, female    DOB: August 11, 1951  Age: 72 y.o. MRN: [id number]  CC: Follow-up (Daughter states mom is not eating/drinking like she should. Her dementia is getting worse)   HPI Sandria Mcenroe presents for dementia; depressed mood, apathetic  Outpatient Medications Prior to Visit  Medication Sig Dispense Refill   amLODipine (NORVASC) 2.5 MG tablet Take 1 tablet (2.5 mg total) by mouth daily. 90 tablet 3   aspirin 81 MG chewable tablet Chew 1 tablet (81 mg total) by mouth 2 (two) times daily. 100 tablet 0   Cholecalciferol (VITAMIN D3) 50 MCG (2000 UT) capsule Take 1 capsule (2,000 Units total) by mouth daily. 100 capsule 3   cyanocobalamin (VITAMIN B12) 100 MCG tablet Take 100 mcg by mouth daily.     donepezil (ARICEPT) 10 MG tablet Take 1 tablet (10 mg total) by mouth at bedtime. 90 tablet 3   hydroxyurea (HYDREA) 500 MG capsule Take 1 capsule (500 mg total) by mouth 2 (two) times daily. May take with food to minimize GI side effects. 180 capsule 0   OLANZapine (ZYPREXA) 2.5 MG tablet Take 1 tablet (2.5 mg total) by mouth at bedtime. 90 tablet 1   potassium chloride SA (KLOR-CON M) 20 MEQ tablet Take 1 tablet (20 mEq total) by mouth 2 (two) times daily. 60 tablet 0   gabapentin (NEURONTIN) 100 MG capsule Take 1 capsule (100 mg total) by mouth 2 (two) times daily. 180 capsule 1   metoprolol tartrate (LOPRESSOR) 25 MG tablet Take 1 tablet (25 mg total) by mouth 2 (two) times daily. 180 tablet 1   B Complex-Folic Acid (B COMPLEX PLUS) TABS Take 1 tablet by mouth daily. (Patient not taking: Reported on 01/01/2023) 100 tablet 3   ibuprofen (ADVIL,MOTRIN) 200 MG tablet Take 200 mg by mouth every 6 (six) hours as needed for mild pain. (Patient not taking: Reported on 01/01/2023)     No facility-administered medications prior to visit.    ROS: Review of Systems  Constitutional:  Positive for fatigue and unexpected weight change. Negative for activity change, appetite  change and chills.  HENT:  Negative for congestion, mouth sores and sinus pressure.   Eyes:  Negative for visual disturbance.  Respiratory:  Positive for cough. Negative for chest tightness.   Cardiovascular:  Negative for chest pain.  Gastrointestinal:  Negative for abdominal pain and nausea.  Genitourinary:  Negative for difficulty urinating, frequency and vaginal pain.  Musculoskeletal:  Negative for back pain and gait problem.  Skin:  Negative for pallor and rash.  Neurological:  Negative for dizziness, tremors, weakness, numbness and headaches.  Psychiatric/Behavioral:  Positive for confusion, decreased concentration and dysphoric mood. Negative for behavioral problems, sleep disturbance and suicidal ideas. The patient is not nervous/anxious.     Objective:  BP 92/74 (BP Location: Left Arm)   Pulse (!) 104   Temp 97.8 F (36.6 C) (Oral)   Ht '5\' 5"'$  (1.651 m)   Wt 112 lb (50.8 kg)   SpO2 97%   BMI 18.64 kg/m   BP Readings from Last 3 Encounters:  01/01/23 92/74  12/17/22 90/62  11/18/22 (!) 150/94    Wt Readings from Last 3 Encounters:  01/01/23 112 lb (50.8 kg)  12/17/22 117 lb (53.1 kg)  11/18/22 124 lb 11.2 oz (56.6 kg)    Physical Exam Constitutional:      General: She is not in acute distress.    Appearance: Normal appearance. She is well-developed.  HENT:  Head: Normocephalic.     Right Ear: External ear normal.     Left Ear: External ear normal.     Nose: Nose normal.  Eyes:     General:        Right eye: No discharge.        Left eye: No discharge.     Conjunctiva/sclera: Conjunctivae normal.     Pupils: Pupils are equal, round, and reactive to light.  Neck:     Thyroid: No thyromegaly.     Vascular: No JVD.     Trachea: No tracheal deviation.  Cardiovascular:     Rate and Rhythm: Normal rate and regular rhythm.     Heart sounds: Normal heart sounds.  Pulmonary:     Effort: No respiratory distress.     Breath sounds: No stridor. No wheezing.   Abdominal:     General: Bowel sounds are normal. There is no distension.     Palpations: Abdomen is soft. There is no mass.     Tenderness: There is no abdominal tenderness. There is no guarding or rebound.  Musculoskeletal:        General: No tenderness.     Cervical back: Normal range of motion and neck supple. No rigidity.  Lymphadenopathy:     Cervical: No cervical adenopathy.  Skin:    Findings: No erythema or rash.  Neurological:     Mental Status: Mental status is at baseline. She is disoriented.     Cranial Nerves: No cranial nerve deficit.     Motor: No abnormal muscle tone.     Coordination: Coordination normal.     Deep Tendon Reflexes: Reflexes normal.  Psychiatric:        Behavior: Behavior normal.        Thought Content: Thought content normal.        Judgment: Judgment normal.     Lab Results  Component Value Date   WBC 8.4 12/17/2022   HGB 12.3 12/17/2022   HCT 36.2 12/17/2022   PLT 565 (H) 12/17/2022   GLUCOSE 107 (H) 12/17/2022   CHOL 211 (H) 07/01/2022   TRIG 217.0 (H) 07/01/2022   HDL 57.00 07/01/2022   LDLDIRECT 112.0 07/01/2022   LDLCALC 116 (H) 11/13/2021   ALT 7 12/17/2022   AST 16 12/17/2022   NA 136 12/17/2022   K 2.9 (L) 12/17/2022   CL 100 12/17/2022   CREATININE 1.13 (H) 12/17/2022   BUN 18 12/17/2022   CO2 27 12/17/2022   TSH 2.20 07/01/2022   INR 1.3 (H) 08/09/2019   HGBA1C 5.3 12/26/2016   MICROALBUR 0.98 08/29/2009    CT BONE MARROW BIOPSY & ASPIRATION  Result Date: 08/25/2022 INDICATION: Thrombocytosis. EXAM: CT GUIDED BONE MARROW ASPIRATION AND CORE BIOPSY MEDICATIONS: None. ANESTHESIA/SEDATION: Moderate (conscious) sedation was employed during this procedure. A total of Versed 0.5 mg and Fentanyl 75 mcg was administered intravenously. Moderate Sedation Time: 15 minutes. The patient's level of consciousness and vital signs were monitored continuously by radiology nursing throughout the procedure under my direct supervision.  FLUOROSCOPY TIME:  CT dose in mGy was not provided. COMPLICATIONS: None immediate. Estimated blood loss: <5 mL PROCEDURE: RADIATION DOSE REDUCTION: This exam was performed according to the departmental dose-optimization program which includes automated exposure control, adjustment of the mA and/or kV according to patient size and/or use of iterative reconstruction technique. Informed written consent was obtained from the the patient and/or patient's representative after a thorough discussion of the procedural risks, benefits and alternatives. All questions  were addressed. Maximal Sterile Barrier Technique was utilized including caps, mask, sterile gowns, sterile gloves, sterile drape, hand hygiene and skin antiseptic. A timeout was performed prior to the initiation of the procedure. The patient was positioned prone and non-contrast localization CT was performed of the pelvis to demonstrate the iliac marrow spaces. Maximal barrier sterile technique utilized including caps, mask, sterile gowns, sterile gloves, large sterile drape, hand hygiene, and chlorhexidine prep. Under sterile conditions and local anesthesia, an 11 gauge coaxial bone biopsy needle was advanced into the RIGHT iliac marrow space. Needle position was confirmed with CT imaging. Initially, bone marrow aspiration was performed. Next, the 11 gauge outer cannula was utilized to obtain a 1 iliac bone marrow core biopsy. Needle was removed. Hemostasis was obtained with compression. The patient tolerated the procedure well. Samples were prepared with the cytotechnologist. IMPRESSION: Successful CT-guided bone marrow aspiration and biopsy, as above. Michaelle Birks, MD [facility] Electronically Signed   By: Michaelle Birks M.D.   On: 08/25/2022 17:06    Assessment & Plan:   Problem List Items Addressed This Visit       Cardiovascular and Mediastinum   HTN (hypertension)    Too sleepy, low  BP Stop Metoprolol         Nervous and Auditory   Alzheimer's dementia with behavioral disturbance ([place]) - Primary    Too sleepy Stop Gabapentin and Metoprolol Start Fluoxetine in 1 week      Relevant Medications   FLUoxetine (PROZAC) 10 MG capsule     Other   Somnolence, daytime    Too sleepy Stop Gabapentin and Metoprolol Start Fluoxetine in 1 week      Situational depression    start Fluoxtine      Relevant Medications   FLUoxetine (PROZAC) 10 MG capsule   Other Visit Diagnoses     Chronic cough       Relevant Orders   DG Chest 2 View         Meds ordered this encounter  Medications   FLUoxetine (PROZAC) 10 MG capsule    Sig: Take 1 capsule (10 mg total) by mouth daily.    Dispense:  30 capsule    Refill:  5      Follow-up: Return in about 4 weeks (around 01/29/2023) for a follow-up visit.  Walker Kehr, MD

## 2023-01-01 NOTE — Assessment & Plan Note (Signed)
Too sleepy Stop Gabapentin and Metoprolol Start Fluoxetine in 1 week

## 2023-01-01 NOTE — Patient Instructions (Addendum)
  Stop Gabapentin and Metoprolol Start Fluoxetine in 1 week

## 2023-01-01 NOTE — Assessment & Plan Note (Signed)
Too sleepy, low BP Stop Metoprolol

## 2023-01-26 ENCOUNTER — Other Ambulatory Visit: Payer: Self-pay | Admitting: *Deleted

## 2023-01-26 DIAGNOSIS — D473 Essential (hemorrhagic) thrombocythemia: Secondary | ICD-10-CM

## 2023-01-27 ENCOUNTER — Inpatient Hospital Stay: Payer: Medicare Other | Admitting: Hematology and Oncology

## 2023-01-27 ENCOUNTER — Inpatient Hospital Stay: Payer: Medicare Other | Attending: Hematology and Oncology

## 2023-01-27 ENCOUNTER — Telehealth: Payer: Self-pay

## 2023-01-27 NOTE — Telephone Encounter (Signed)
Patient missed apt today. Called and left VM requesting a call back to reschedule.

## 2023-03-27 ENCOUNTER — Encounter: Payer: Self-pay | Admitting: Internal Medicine

## 2023-04-12 ENCOUNTER — Other Ambulatory Visit: Payer: Self-pay | Admitting: Hematology and Oncology

## 2023-04-13 ENCOUNTER — Telehealth: Payer: Self-pay | Admitting: Hematology and Oncology

## 2023-04-13 NOTE — Telephone Encounter (Signed)
Reached out to patient to schedule per 4/15 IB, spoke with Daughter; Daughter is aware of date and time of appointments.

## 2023-05-11 ENCOUNTER — Inpatient Hospital Stay: Payer: Medicare Other | Attending: Physician Assistant | Admitting: Physician Assistant

## 2023-05-11 ENCOUNTER — Other Ambulatory Visit: Payer: Self-pay

## 2023-05-11 ENCOUNTER — Inpatient Hospital Stay: Payer: Medicare Other

## 2023-05-11 VITALS — BP 132/73 | HR 61 | Temp 97.2°F | Resp 18 | Ht 65.0 in | Wt 119.4 lb

## 2023-05-11 DIAGNOSIS — D649 Anemia, unspecified: Secondary | ICD-10-CM

## 2023-05-11 DIAGNOSIS — Z79899 Other long term (current) drug therapy: Secondary | ICD-10-CM | POA: Diagnosis not present

## 2023-05-11 DIAGNOSIS — E876 Hypokalemia: Secondary | ICD-10-CM | POA: Diagnosis not present

## 2023-05-11 DIAGNOSIS — D473 Essential (hemorrhagic) thrombocythemia: Secondary | ICD-10-CM

## 2023-05-11 DIAGNOSIS — D539 Nutritional anemia, unspecified: Secondary | ICD-10-CM | POA: Insufficient documentation

## 2023-05-11 DIAGNOSIS — R7989 Other specified abnormal findings of blood chemistry: Secondary | ICD-10-CM | POA: Diagnosis not present

## 2023-05-11 DIAGNOSIS — Z7982 Long term (current) use of aspirin: Secondary | ICD-10-CM | POA: Insufficient documentation

## 2023-05-11 LAB — CBC WITH DIFFERENTIAL (CANCER CENTER ONLY)
Abs Immature Granulocytes: 0.03 10*3/uL (ref 0.00–0.07)
Basophils Absolute: 0.1 10*3/uL (ref 0.0–0.1)
Basophils Relative: 1 %
Eosinophils Absolute: 0.1 10*3/uL (ref 0.0–0.5)
Eosinophils Relative: 1 %
HCT: 30.9 % — ABNORMAL LOW (ref 36.0–46.0)
Hemoglobin: 10.3 g/dL — ABNORMAL LOW (ref 12.0–15.0)
Immature Granulocytes: 1 %
Lymphocytes Relative: 16 %
Lymphs Abs: 0.7 10*3/uL (ref 0.7–4.0)
MCH: 37.2 pg — ABNORMAL HIGH (ref 26.0–34.0)
MCHC: 33.3 g/dL (ref 30.0–36.0)
MCV: 111.6 fL — ABNORMAL HIGH (ref 80.0–100.0)
Monocytes Absolute: 0.4 10*3/uL (ref 0.1–1.0)
Monocytes Relative: 8 %
Neutro Abs: 3.3 10*3/uL (ref 1.7–7.7)
Neutrophils Relative %: 73 %
Platelet Count: 455 10*3/uL — ABNORMAL HIGH (ref 150–400)
RBC: 2.77 MIL/uL — ABNORMAL LOW (ref 3.87–5.11)
RDW: 11.3 % — ABNORMAL LOW (ref 11.5–15.5)
WBC Count: 4.5 10*3/uL (ref 4.0–10.5)
nRBC: 0 % (ref 0.0–0.2)

## 2023-05-11 LAB — CMP (CANCER CENTER ONLY)
ALT: 109 U/L — ABNORMAL HIGH (ref 0–44)
AST: 69 U/L — ABNORMAL HIGH (ref 15–41)
Albumin: 4.1 g/dL (ref 3.5–5.0)
Alkaline Phosphatase: 240 U/L — ABNORMAL HIGH (ref 38–126)
Anion gap: 7 (ref 5–15)
BUN: 15 mg/dL (ref 8–23)
CO2: 27 mmol/L (ref 22–32)
Calcium: 9.4 mg/dL (ref 8.9–10.3)
Chloride: 100 mmol/L (ref 98–111)
Creatinine: 1.15 mg/dL — ABNORMAL HIGH (ref 0.44–1.00)
GFR, Estimated: 51 mL/min — ABNORMAL LOW (ref >60)
Glucose, Bld: 90 mg/dL (ref 70–99)
Potassium: 4.5 mmol/L (ref 3.5–5.1)
Sodium: 134 mmol/L — ABNORMAL LOW (ref 135–145)
Total Bilirubin: 0.5 mg/dL (ref 0.3–1.2)
Total Protein: 7.2 g/dL (ref 6.5–8.1)

## 2023-05-11 LAB — FOLATE: Folate: 33.3 ng/mL (ref >5.9)

## 2023-05-11 LAB — VITAMIN B12: Vitamin B-12: 786 pg/mL (ref 180–914)

## 2023-05-11 NOTE — Progress Notes (Unsigned)
[facility] [phone]   [phone]  PROGRESS NOTE  Patient Care Team: Plotnikov, Georgina Quint, MD as PCP - General (Internal Medicine)  Hematological/Oncological History # Essential Thrombocytosis, JAK2 positive  11/13/2021: WBC 12.5, Hgb 14.5, MCV 79.1, Plt 852 07/01/2022: WBC 10.2, Hgb 13.2, MCV 79.5, Plt 817 07/27/2022: establish care with Dr. Leonides Schanz  10/15/2022: start of hydroxyurea 500 mg PO daily.  11/18/2022: increase to hydroxyurea 500 mg BID 12/17/2022: WBC 8.4, Hgb 12.3, MCV 80.6, Plt 565  Interval History:  Janet Sherman 72 y.o. female with medical history significant for essential thrombocytosis who presents for a follow up visit. The patient's last visit was on 12/17/2022. In the interim since the last visit she has continued hydroxyurea 500 mg PO BID.   On exam today [name] is accompanied by her daughter. She reports she is tolerating hydroxyurea therapy without any significant toxicities. Her energy is stable and she denies any changes to her weight. She denies nausea, vomiting or abdominal pain. Her bowel habits are unchanged without recurrent episodes of diarrhea or constipation. She denies easy bruising or signs of bleeding. She denies fevers, chills, sweats, shortness of breath, chest pain or cough. Overall she is willing and able to proceed with hydroxyurea therapy at this time.  A full 10 point ROS was otherwise negative.   MEDICAL HISTORY:  Past Medical History:  Diagnosis Date   Arthritis    "right hand" (01/22/2017)   Gallstones    History of kidney stones    Hypertension    Migraine    "a few/year" (01/22/2017)   Pneumonia X 1    SURGICAL HISTORY: Past Surgical History:  Procedure Laterality Date   CRANIOTOMY N/A 10/16/2016   Procedure: Pituitary tumor (benign) resection - CRANIOTOMY HYPOPHYSECTOMY TRANSNASAL APPROACH;  Surgeon: Lisbeth Renshaw, MD;  Location: [facility] OR;  Service: Neurosurgery;  Laterality: N/A;    ELECTROPHYSIOLOGIC STUDY N/A 01/22/2017   Procedure: SVT Ablation;  Surgeon: Will Jorja Loa, MD;  Location: [facility] INVASIVE CV LAB;  Service: Cardiovascular;  Laterality: N/A;   ELECTROPHYSIOLOGIC STUDY N/A 01/22/2017   Procedure: Electrophysiology Study;  Surgeon: Will Jorja Loa, MD;  Location: [facility] INVASIVE CV LAB;  Service: Cardiovascular;  Laterality: N/A;   gallstones  ~ 2000   KIDNEY STONE SURGERY     "cut me open"   SUPRAVENTRICULAR TACHYCARDIA ABLATION  01/22/2017   TRANSNASAL APPROACH N/A 10/16/2016   Procedure: TRANSNASAL APPROACH WITH FUSION;  Surgeon: Osborn Coho, MD;  Location: [facility] OR;  Service: ENT;  Laterality: N/A;   TUBAL LIGATION      SOCIAL HISTORY: Social History   Socioeconomic History   Marital status: Widowed    Spouse name: Janet Sherman   Number of children: 5   Years of education: 10   Highest education level: Not on file  Occupational History    Employer: [employer]    Comment: [employer]  Tobacco Use   Smoking status: Former    Packs/day: 1    Types: Cigarettes    Quit date: 10/16/2016    Years since quitting: 6.5   Smokeless tobacco: Never  Vaping Use   Vaping Use: Never used  Substance and Sexual Activity   Alcohol use: No   Drug use: No   Sexual activity: Never  Other Topics Concern   Not on file  Social History Narrative   Patient is married(James) and lives at home with her husband.   Patient has 5 children.   Patient works full-time.  Patient has a 10th  Grade education.   Patient is right handed.   Patient drinks 2-3 cups of coffee daily.   Social Determinants of Health   Financial Resource Strain: Not on file  Food Insecurity: Not on file  Transportation Needs: Not on file  Physical Activity: Not on file  Stress: Not on file  Social Connections: Not on file  Intimate Partner Violence: Not on file    FAMILY HISTORY: Family History  Problem Relation Age of Onset   Hypertension Mother    Cancer  Father        lung   Hypertension Sister    Hypertension Brother     ALLERGIES:  is allergic to mold extract [trichophyton] and pollen extract.  MEDICATIONS:  Current Outpatient Medications  Medication Sig Dispense Refill   aspirin 81 MG chewable tablet Chew 1 tablet (81 mg total) by mouth 2 (two) times daily. 100 tablet 0   donepezil (ARICEPT) 10 MG tablet Take 1 tablet (10 mg total) by mouth at bedtime. 90 tablet 3   hydroxyurea (HYDREA) 500 MG capsule TAKE 1 CAPSULE BY MOUTH TWICE DAILY WITH FOOD TO  MINIMIZE  GI  SIDE  EFFECTES 180 capsule 0   potassium chloride SA (KLOR-CON M) 20 MEQ tablet Take 1 tablet (20 mEq total) by mouth 2 (two) times daily. 60 tablet 0   amLODipine (NORVASC) 2.5 MG tablet Take 1 tablet (2.5 mg total) by mouth daily. (Patient not taking: Reported on 05/11/2023) 90 tablet 3   Cholecalciferol (VITAMIN D3) 50 MCG (2000 UT) capsule Take 1 capsule (2,000 Units total) by mouth daily. (Patient not taking: Reported on 05/11/2023) 100 capsule 3   cyanocobalamin (VITAMIN B12) 100 MCG tablet Take 100 mcg by mouth daily. (Patient not taking: Reported on 05/11/2023)     FLUoxetine (PROZAC) 10 MG capsule Take 1 capsule (10 mg total) by mouth daily. (Patient not taking: Reported on 05/11/2023) 30 capsule 5   OLANZapine (ZYPREXA) 2.5 MG tablet Take 1 tablet (2.5 mg total) by mouth at bedtime. (Patient not taking: Reported on 05/11/2023) 90 tablet 1   No current facility-administered medications for this visit.    REVIEW OF SYSTEMS:   Constitutional: ( - ) fevers, ( - )  chills , ( - ) night sweats Eyes: ( - ) blurriness of vision, ( - ) double vision, ( - ) watery eyes Ears, nose, mouth, throat, and face: ( - ) mucositis, ( - ) sore throat Respiratory: ( - ) cough, ( - ) dyspnea, ( - ) wheezes Cardiovascular: ( - ) palpitation, ( - ) chest discomfort, ( - ) lower extremity swelling Gastrointestinal:  ( - ) nausea, ( - ) heartburn, ( - ) change in bowel habits Skin: ( - )  abnormal skin rashes Lymphatics: ( - ) new lymphadenopathy, ( - ) easy bruising Neurological: ( - ) numbness, ( - ) tingling, ( - ) new weaknesses Behavioral/Psych: ( - ) mood change, ( - ) new changes  All other systems were reviewed with the patient and are negative.  PHYSICAL EXAMINATION: ECOG PERFORMANCE STATUS: 0 - Asymptomatic  Vitals:   05/11/23 0843  BP: 132/73  Pulse: 61  Resp: 18  Temp: (!) 97.2 F (36.2 C)  SpO2: 100%     Filed Weights   05/11/23 0843  Weight: 119 lb 6.4 oz (54.2 kg)      GENERAL: Well-appearing elderly African-American female, alert, no distress and comfortable SKIN: skin color, texture, turgor are normal,  no rashes or significant lesions EYES: conjunctiva are pink and non-injected, sclera clear LUNGS: clear to auscultation and percussion with normal breathing effort HEART: regular rate & rhythm and no murmurs and no lower extremity edema Musculoskeletal: no cyanosis of digits and no clubbing  PSYCH: alert & oriented x 3, fluent speech NEURO: no focal motor/sensory deficits  LABORATORY DATA:  I have reviewed the data as listed    Latest Ref Rng & Units 05/11/2023    7:59 AM 12/17/2022    1:35 PM 11/18/2022    2:23 PM  CBC  WBC 4.0 - 10.5 K/uL 4.5  8.4  8.4   Hemoglobin 12.0 - 15.0 g/dL 40.9  81.1  91.4   Hematocrit 36.0 - 46.0 % 30.9  36.2  41.1   Platelets 150 - 400 K/uL 455  565  615        Latest Ref Rng & Units 05/11/2023    7:59 AM 12/17/2022    1:35 PM 11/18/2022    2:23 PM  CMP  Glucose 70 - 99 mg/dL 90  782  956   BUN 8 - 23 mg/dL 15  18  14    Creatinine 0.44 - 1.00 mg/dL 2.13  0.86  5.78   Sodium 135 - 145 mmol/L 134  136  143   Potassium 3.5 - 5.1 mmol/L 4.5  2.9  4.1   Chloride 98 - 111 mmol/L 100  100  106   CO2 22 - 32 mmol/L 27  27  31    Calcium 8.9 - 10.3 mg/dL 9.4  9.3  9.7   Total Protein 6.5 - 8.1 g/dL 7.2  7.2  7.2   Total Bilirubin 0.3 - 1.2 mg/dL 0.5  0.7  0.3   Alkaline Phos 38 - 126 U/L 240  55  57    AST 15 - 41 U/L 69  16  15   ALT 0 - 44 U/L 109  7  7     RADIOGRAPHIC STUDIES: No results found.  ASSESSMENT & PLAN Vian Denaro is a 72 y.o. female with medical history significant for essential thrombocytosis who presents for a follow up visit.   Today we discussed the diagnosis of essential thrombocytosis.  Essential thrombocytosis and myeloproliferative neoplasm by the patient has overproduction of a particular blood cell.  In this case the patient has a JAK2 mutation which is causing thrombocytosis.  Goals of treatment including monitoring for progression to myelofibrosis and reducing risk of VTE.  Recommend aspirin 81 mg p.o. daily and cytoreductive therapy with hydroxyurea.  #Essential Thrombocytosis, JAK2 Positive -- Diagnosis confirmed by the presence of JAK2 and bone marrow biopsy showing myeloproliferative neoplasm. -- Patient currently on 81 mg baby aspirin daily, recommend continuation of this -- Labs today show white blood cell count 4.5, hemoglobin 10.3, MCV 111.6, and platelets of 455 -- Currently taking Hydroxyurea 500 mg BID for cytoreductive therapy. Recommend to decrease dose due to anemia by taking medication twice daily on Monday, Wednesday and Friday. Then remaining days will be daily.  -- Return to clinic in 4 weeks time for evaluation on hydroxyurea therapy.  #Hypokalemia: -Potassium is 4.5 today.  -Currently on potassium chloride 20 mEq BID, recommend to continue  #Macrocytic anemia: --Checked vitamin B12 and folate levels today due to acute onset of anemia, both normal  #Elevated LFTs: --AST 69, ALT 109, Alk Phos 240.  --Patient's daughter reports she has been taking quite some Tylenol, encouraged to stop for now.  --Repeat LFTs next week.  Orders Placed This Encounter  Procedures   Vitamin B12    Standing Status:   Future    Number of Occurrences:   1    Standing Expiration Date:   05/10/2024   Methylmalonic acid, serum    Standing Status:   Future     Number of Occurrences:   1    Standing Expiration Date:   05/10/2024   Folate, Serum    Standing Status:   Future    Number of Occurrences:   1    Standing Expiration Date:   05/10/2024   CMP (Cancer Center only)    Standing Status:   Future    Standing Expiration Date:   05/12/2024    All questions were answered. The patient knows to call the clinic with any problems, questions or concerns.  I have spent a total of 30 minutes minutes of face-to-face and non-face-to-face time, preparing to see the patient, performing a medically appropriate examination, counseling and educating the patient,  documenting clinical information in the electronic health record, and care coordination.   Georga Kaufmann PA-C Dept of Hematology and Oncology [facility] Phone: [phone]   05/13/2023 9:37 AM

## 2023-05-13 ENCOUNTER — Telehealth: Payer: Self-pay | Admitting: Hematology and Oncology

## 2023-05-14 LAB — METHYLMALONIC ACID, SERUM: Methylmalonic Acid, Quantitative: 200 nmol/L (ref 0–378)

## 2023-05-17 ENCOUNTER — Inpatient Hospital Stay: Payer: Medicare Other

## 2023-05-17 ENCOUNTER — Telehealth: Payer: Self-pay

## 2023-05-17 ENCOUNTER — Other Ambulatory Visit: Payer: Self-pay

## 2023-05-17 DIAGNOSIS — D473 Essential (hemorrhagic) thrombocythemia: Secondary | ICD-10-CM | POA: Diagnosis not present

## 2023-05-17 DIAGNOSIS — R7989 Other specified abnormal findings of blood chemistry: Secondary | ICD-10-CM

## 2023-05-17 LAB — CMP (CANCER CENTER ONLY)
ALT: 51 U/L — ABNORMAL HIGH (ref 0–44)
AST: 35 U/L (ref 15–41)
Albumin: 4.3 g/dL (ref 3.5–5.0)
Alkaline Phosphatase: 199 U/L — ABNORMAL HIGH (ref 38–126)
Anion gap: 6 (ref 5–15)
BUN: 15 mg/dL (ref 8–23)
CO2: 31 mmol/L (ref 22–32)
Calcium: 9.5 mg/dL (ref 8.9–10.3)
Chloride: 104 mmol/L (ref 98–111)
Creatinine: 1.05 mg/dL — ABNORMAL HIGH (ref 0.44–1.00)
GFR, Estimated: 57 mL/min — ABNORMAL LOW (ref >60)
Glucose, Bld: 132 mg/dL — ABNORMAL HIGH (ref 70–99)
Potassium: 4.4 mmol/L (ref 3.5–5.1)
Sodium: 141 mmol/L (ref 135–145)
Total Bilirubin: 0.4 mg/dL (ref 0.3–1.2)
Total Protein: 7.4 g/dL (ref 6.5–8.1)

## 2023-05-17 NOTE — Telephone Encounter (Signed)
Pt's daughter, Sunny Schlein, advised with VU

## 2023-05-17 NOTE — Telephone Encounter (Signed)
-----   Message from Briant Cedar, PA-C sent at 05/17/2023 11:56 AM EDT ----- Please notify patient that her LFTs have improved since last week. We can monitor for now.   ----- Message ----- From: Interface, [facility] [place] Sent: 05/17/2023   9:45 AM EDT To: Briant Cedar, PA-C

## 2023-06-07 ENCOUNTER — Other Ambulatory Visit: Payer: Self-pay | Admitting: Physician Assistant

## 2023-06-07 DIAGNOSIS — D473 Essential (hemorrhagic) thrombocythemia: Secondary | ICD-10-CM

## 2023-06-08 ENCOUNTER — Inpatient Hospital Stay: Payer: Medicare Other | Admitting: Physician Assistant

## 2023-06-08 ENCOUNTER — Inpatient Hospital Stay: Payer: Medicare Other | Attending: Physician Assistant

## 2023-08-09 ENCOUNTER — Other Ambulatory Visit: Payer: Self-pay | Admitting: Hematology and Oncology

## 2023-08-13 ENCOUNTER — Other Ambulatory Visit: Payer: Self-pay | Admitting: *Deleted

## 2023-08-13 MED ORDER — HYDROXYUREA 500 MG PO CAPS: 500.0000 mg | ORAL_CAPSULE | Freq: Two times a day (BID) | ORAL | 0 refills | Status: DC

## 2023-09-14 ENCOUNTER — Encounter: Payer: Self-pay | Admitting: Internal Medicine

## 2023-09-14 ENCOUNTER — Ambulatory Visit: Payer: Medicare Other | Admitting: Internal Medicine

## 2023-09-14 VITALS — BP 124/78 | HR 65 | Temp 98.0°F | Ht 65.0 in | Wt 121.0 lb

## 2023-09-14 DIAGNOSIS — M25511 Pain in right shoulder: Secondary | ICD-10-CM | POA: Diagnosis not present

## 2023-09-14 DIAGNOSIS — E538 Deficiency of other specified B group vitamins: Secondary | ICD-10-CM | POA: Diagnosis not present

## 2023-09-14 DIAGNOSIS — Z0000 Encounter for general adult medical examination without abnormal findings: Secondary | ICD-10-CM

## 2023-09-14 DIAGNOSIS — G8929 Other chronic pain: Secondary | ICD-10-CM | POA: Diagnosis not present

## 2023-09-14 DIAGNOSIS — D75839 Thrombocytosis, unspecified: Secondary | ICD-10-CM

## 2023-09-14 DIAGNOSIS — G309 Alzheimer's disease, unspecified: Secondary | ICD-10-CM | POA: Diagnosis not present

## 2023-09-14 DIAGNOSIS — F02818 Dementia in other diseases classified elsewhere, unspecified severity, with other behavioral disturbance: Secondary | ICD-10-CM

## 2023-09-14 DIAGNOSIS — M7551 Bursitis of right shoulder: Secondary | ICD-10-CM | POA: Diagnosis not present

## 2023-09-14 DIAGNOSIS — M25519 Pain in unspecified shoulder: Secondary | ICD-10-CM | POA: Insufficient documentation

## 2023-09-14 DIAGNOSIS — E785 Hyperlipidemia, unspecified: Secondary | ICD-10-CM | POA: Diagnosis not present

## 2023-09-14 LAB — URINALYSIS, ROUTINE W REFLEX MICROSCOPIC
Bilirubin Urine: NEGATIVE
Hgb urine dipstick: NEGATIVE
Ketones, ur: NEGATIVE
Nitrite: NEGATIVE
Specific Gravity, Urine: 1.01 (ref 1.000–1.030)
Total Protein, Urine: NEGATIVE
Urine Glucose: NEGATIVE
Urobilinogen, UA: 1 (ref 0.0–1.0)
pH: 6.5 (ref 5.0–8.0)

## 2023-09-14 LAB — COMPREHENSIVE METABOLIC PANEL
ALT: 7 U/L (ref 0–35)
AST: 15 U/L (ref 0–37)
Albumin: 4 g/dL (ref 3.5–5.2)
Alkaline Phosphatase: 61 U/L (ref 39–117)
BUN: 15 mg/dL (ref 6–23)
CO2: 28 meq/L (ref 19–32)
Calcium: 9.2 mg/dL (ref 8.4–10.5)
Chloride: 103 meq/L (ref 96–112)
Creatinine, Ser: 1.13 mg/dL (ref 0.40–1.20)
GFR: 48.98 mL/min — ABNORMAL LOW (ref >60.00)
Glucose, Bld: 96 mg/dL (ref 70–99)
Potassium: 4 meq/L (ref 3.5–5.1)
Sodium: 139 meq/L (ref 135–145)
Total Bilirubin: 0.5 mg/dL (ref 0.2–1.2)
Total Protein: 7.1 g/dL (ref 6.0–8.3)

## 2023-09-14 LAB — CBC WITH DIFFERENTIAL/PLATELET
Basophils Absolute: 0 10*3/uL (ref 0.0–0.1)
Basophils Relative: 0.7 % (ref 0.0–3.0)
Eosinophils Absolute: 0.1 10*3/uL (ref 0.0–0.7)
Eosinophils Relative: 0.9 % (ref 0.0–5.0)
HCT: 35.4 % — ABNORMAL LOW (ref 36.0–46.0)
Hemoglobin: 11.6 g/dL — ABNORMAL LOW (ref 12.0–15.0)
Lymphocytes Relative: 12.7 % (ref 12.0–46.0)
Lymphs Abs: 0.8 10*3/uL (ref 0.7–4.0)
MCHC: 32.8 g/dL (ref 30.0–36.0)
MCV: 108.4 fl — ABNORMAL HIGH (ref 78.0–100.0)
Monocytes Absolute: 0.4 10*3/uL (ref 0.1–1.0)
Monocytes Relative: 6.3 % (ref 3.0–12.0)
Neutro Abs: 4.7 10*3/uL (ref 1.4–7.7)
Neutrophils Relative %: 79.4 % — ABNORMAL HIGH (ref 43.0–77.0)
Platelets: 316 10*3/uL (ref 150.0–400.0)
RBC: 3.27 Mil/uL — ABNORMAL LOW (ref 3.87–5.11)
RDW: 14.1 % (ref 11.5–15.5)
WBC: 5.9 10*3/uL (ref 4.0–10.5)

## 2023-09-14 LAB — LIPID PANEL
Cholesterol: 230 mg/dL — ABNORMAL HIGH (ref 0–200)
HDL: 57.3 mg/dL (ref >39.00)
LDL Cholesterol: 125 mg/dL — ABNORMAL HIGH (ref 0–99)
NonHDL: 173
Total CHOL/HDL Ratio: 4
Triglycerides: 238 mg/dL — ABNORMAL HIGH (ref 0.0–149.0)
VLDL: 47.6 mg/dL — ABNORMAL HIGH (ref 0.0–40.0)

## 2023-09-14 LAB — TSH: TSH: 2.61 u[IU]/mL (ref 0.35–5.50)

## 2023-09-14 MED ORDER — MELOXICAM 7.5 MG PO TABS: ORAL_TABLET | ORAL | 1 refills | Status: DC

## 2023-09-14 MED ORDER — DONEPEZIL HCL 10 MG PO TABS: 10.0000 mg | ORAL_TABLET | Freq: Every day | ORAL | 3 refills | Status: DC

## 2023-09-14 MED ORDER — HYDROXYUREA 500 MG PO CAPS: 500.0000 mg | ORAL_CAPSULE | Freq: Two times a day (BID) | ORAL | 1 refills | Status: DC

## 2023-09-14 MED ORDER — METHYLPREDNISOLONE ACETATE 40 MG/ML IJ SUSP
40.0000 mg | Freq: Once | INTRAMUSCULAR | Status: AC
Start: 2023-09-14 — End: 2023-09-14
  Administered 2023-09-14: 40 mg via INTRAMUSCULAR

## 2023-09-14 NOTE — Patient Instructions (Signed)
Postprocedure instructions :  ? ? A Band-Aid should be left on for 12 hours. Injection therapy is not a cure itself. It is used in conjunction with other modalities. You can use nonsteroidal anti-inflammatories like ibuprofen , hot and cold compresses. Rest is recommended in the next 24 hours. You need to report immediately  if fever, chills or any signs of infection develop. ? ? ?Blue-Emu cream -- use 2-3 times a day ? ? ? ?

## 2023-09-14 NOTE — Assessment & Plan Note (Signed)
Worse See procedure Meloxicam

## 2023-09-14 NOTE — Progress Notes (Signed)
Subjective:  Patient ID: Janet Sherman, female    DOB: 01-Jan-1951  Age: 72 y.o. MRN: [id number]  CC: No chief complaint on file.   HPI Anisa Mcloud presents for a well exam C/o R shoulder pain - severe x months, worse now. No falls  Outpatient Medications Prior to Visit  Medication Sig Dispense Refill   aspirin 81 MG chewable tablet Chew 1 tablet (81 mg total) by mouth 2 (two) times daily. 100 tablet 0   donepezil (ARICEPT) 10 MG tablet Take 1 tablet (10 mg total) by mouth at bedtime. 90 tablet 3   hydroxyurea (HYDREA) 500 MG capsule Take 1 capsule (500 mg total) by mouth 2 (two) times daily. May take with food to minimize GI side effects. 180 capsule 0   Cholecalciferol (VITAMIN D3) 50 MCG (2000 UT) capsule Take 1 capsule (2,000 Units total) by mouth daily. (Patient not taking: Reported on 05/11/2023) 100 capsule 3   cyanocobalamin (VITAMIN B12) 100 MCG tablet Take 100 mcg by mouth daily. (Patient not taking: Reported on 05/11/2023)     amLODipine (NORVASC) 2.5 MG tablet Take 1 tablet (2.5 mg total) by mouth daily. (Patient not taking: Reported on 05/11/2023) 90 tablet 3   FLUoxetine (PROZAC) 10 MG capsule Take 1 capsule (10 mg total) by mouth daily. (Patient not taking: Reported on 05/11/2023) 30 capsule 5   OLANZapine (ZYPREXA) 2.5 MG tablet Take 1 tablet (2.5 mg total) by mouth at bedtime. (Patient not taking: Reported on 05/11/2023) 90 tablet 1   potassium chloride SA (KLOR-CON M) 20 MEQ tablet Take 1 tablet (20 mEq total) by mouth 2 (two) times daily. 60 tablet 0   No facility-administered medications prior to visit.    ROS: Review of Systems  Constitutional:  Negative for activity change, appetite change, chills, fatigue and unexpected weight change.  HENT:  Negative for congestion, mouth sores and sinus pressure.   Eyes:  Negative for visual disturbance.  Respiratory:  Negative for cough and chest tightness.   Gastrointestinal:  Negative for abdominal pain and nausea.   Genitourinary:  Negative for difficulty urinating, frequency and vaginal pain.  Musculoskeletal:  Positive for arthralgias. Negative for back pain and gait problem.  Skin:  Negative for pallor and rash.  Neurological:  Negative for dizziness, tremors, weakness, numbness and headaches.  Psychiatric/Behavioral:  Positive for confusion and decreased concentration. Negative for hallucinations, sleep disturbance and suicidal ideas. The patient is not nervous/anxious.     Objective:  BP 124/78 (BP Location: Left Arm, Patient Position: Sitting, Cuff Size: Normal)   Pulse 65   Temp 98 F (36.7 C) (Oral)   Ht 5\' 5"  (1.651 m)   Wt 121 lb (54.9 kg)   SpO2 98%   BMI 20.14 kg/m   BP Readings from Last 3 Encounters:  09/14/23 124/78  05/11/23 132/73  01/01/23 92/74    Wt Readings from Last 3 Encounters:  09/14/23 121 lb (54.9 kg)  05/11/23 119 lb 6.4 oz (54.2 kg)  01/01/23 112 lb (50.8 kg)    Physical Exam Constitutional:      General: She is not in acute distress.    Appearance: Normal appearance. She is well-developed.  HENT:     Head: Normocephalic.     Right Ear: External ear normal.     Left Ear: External ear normal.     Nose: Nose normal.  Eyes:     General:        Right eye: No discharge.  Left eye: No discharge.     Conjunctiva/sclera: Conjunctivae normal.     Pupils: Pupils are equal, round, and reactive to light.  Neck:     Thyroid: No thyromegaly.     Vascular: No JVD.     Trachea: No tracheal deviation.  Cardiovascular:     Rate and Rhythm: Normal rate and regular rhythm.     Heart sounds: Normal heart sounds.  Pulmonary:     Effort: No respiratory distress.     Breath sounds: No stridor. No wheezing.  Abdominal:     General: Bowel sounds are normal. There is no distension.     Palpations: Abdomen is soft. There is no mass.     Tenderness: There is no abdominal tenderness. There is no guarding or rebound.  Musculoskeletal:        General: Tenderness  present.     Cervical back: Normal range of motion and neck supple. No rigidity.  Lymphadenopathy:     Cervical: No cervical adenopathy.  Skin:    Findings: No erythema or rash.  Neurological:     Mental Status: Mental status is at baseline.     Cranial Nerves: No cranial nerve deficit.     Motor: No abnormal muscle tone.     Coordination: Coordination normal.     Deep Tendon Reflexes: Reflexes normal.  Psychiatric:        Behavior: Behavior normal.        Thought Content: Thought content normal.        Judgment: Judgment normal.   Right shoulder is very painful with range of motion   Procedure :Joint Injection,  R shoulder   Indication:  Subacromial bursitis with refractory  chronic pain.   Risks including unsuccessful procedure , bleeding, infection, bruising, skin atrophy, "steroid flare-up" and others were explained to the patient in detail as well as the benefits. Informed consent was obtained and signed.   Tthe patient was placed in a comfortable position. Lateral approach was used. Skin was prepped with Betadine and alcohol  and anesthetized with a cooling spray. Then, a 5 cc syringe with a 2 inch long 24-gauge needle was used for a joint injection.. The needle was advanced  Into the subacromial space.The bursa was injected with 3 mL of 2% lidocaine and 40 mg of Depo-Medrol .  Band-Aid was applied.   Tolerated well. Complications: None. Good pain relief following the procedure.   Postprocedure instructions :    A Band-Aid should be left on for 12 hours. Injection therapy is not a cure itself. It is used in conjunction with other modalities. You can use nonsteroidal anti-inflammatories like ibuprofen , hot and cold compresses. Rest is recommended in the next 24 hours. You need to report immediately  if fever, chills or any signs of infection develop.    Lab Results  Component Value Date   WBC 5.9 09/14/2023   HGB 11.6 (L) 09/14/2023   HCT 35.4 (L) 09/14/2023   PLT 316.0  09/14/2023   GLUCOSE 96 09/14/2023   CHOL 230 (H) 09/14/2023   TRIG 238.0 (H) 09/14/2023   HDL 57.30 09/14/2023   LDLDIRECT 112.0 07/01/2022   LDLCALC 125 (H) 09/14/2023   ALT 7 09/14/2023   AST 15 09/14/2023   NA 139 09/14/2023   K 4.0 09/14/2023   CL 103 09/14/2023   CREATININE 1.13 09/14/2023   BUN 15 09/14/2023   CO2 28 09/14/2023   TSH 2.61 09/14/2023   INR 1.3 (H) 08/09/2019   HGBA1C  5.3 12/26/2016   MICROALBUR 0.98 08/29/2009    CT BONE MARROW BIOPSY & ASPIRATION  Result Date: 08/25/2022 INDICATION: Thrombocytosis. EXAM: CT GUIDED BONE MARROW ASPIRATION AND CORE BIOPSY MEDICATIONS: None. ANESTHESIA/SEDATION: Moderate (conscious) sedation was employed during this procedure. A total of Versed 0.5 mg and Fentanyl 75 mcg was administered intravenously. Moderate Sedation Time: 15 minutes. The patient's level of consciousness and vital signs were monitored continuously by radiology nursing throughout the procedure under my direct supervision. FLUOROSCOPY TIME:  CT dose in mGy was not provided. COMPLICATIONS: None immediate. Estimated blood loss: <5 mL PROCEDURE: RADIATION DOSE REDUCTION: This exam was performed according to the departmental dose-optimization program which includes automated exposure control, adjustment of the mA and/or kV according to patient size and/or use of iterative reconstruction technique. Informed written consent was obtained from the the patient and/or patient's representative after a thorough discussion of the procedural risks, benefits and alternatives. All questions were addressed. Maximal Sterile Barrier Technique was utilized including caps, mask, sterile gowns, sterile gloves, sterile drape, hand hygiene and skin antiseptic. A timeout was performed prior to the initiation of the procedure. The patient was positioned prone and non-contrast localization CT was performed of the pelvis to demonstrate the iliac marrow spaces. Maximal barrier sterile technique  utilized including caps, mask, sterile gowns, sterile gloves, large sterile drape, hand hygiene, and chlorhexidine prep. Under sterile conditions and local anesthesia, an 11 gauge coaxial bone biopsy needle was advanced into the RIGHT iliac marrow space. Needle position was confirmed with CT imaging. Initially, bone marrow aspiration was performed. Next, the 11 gauge outer cannula was utilized to obtain a 1 iliac bone marrow core biopsy. Needle was removed. Hemostasis was obtained with compression. The patient tolerated the procedure well. Samples were prepared with the cytotechnologist. IMPRESSION: Successful CT-guided bone marrow aspiration and biopsy, as above. Roanna Banning, MD [facility] Electronically Signed   By: Roanna Banning M.D.   On: 08/25/2022 17:06    Assessment & Plan:   Problem List Items Addressed This Visit     Well adult exam    We discussed age appropriate health related issues, including available/recomended screening tests and vaccinations. We discussed a need for adhering to healthy diet and exercise. Labs were ordered to be later reviewed . All questions were answered. A cardiac CT scan for calcium scoring offered 8/20 Mammo q 12 mo Cologuard (-) 2019 Dexa offered 2020 Flu shot mammo      Dyslipidemia   B12 deficiency   Thrombocytosis   Alzheimer's dementia with behavioral disturbance (HCC)   Relevant Medications   donepezil (ARICEPT) 10 MG tablet   Shoulder pain - Primary    Worse See procedure Meloxicam         Meds ordered this encounter  Medications   meloxicam (MOBIC) 7.5 MG tablet    Sig: Take 1 tab po every day pc x 10 days, then prn    Dispense:  30 tablet    Refill:  1   donepezil (ARICEPT) 10 MG tablet    Sig: Take 1 tablet (10 mg total) by mouth at bedtime.    Dispense:  90 tablet    Refill:  3   hydroxyurea (HYDREA) 500 MG capsule    Sig: Take 1 capsule (500 mg total) by mouth 2  (two) times daily. May take with food to minimize GI side effects.    Dispense:  180 capsule    Refill:  1   methylPREDNISolone acetate (DEPO-MEDROL)  injection 40 mg      Follow-up: Return in about 6 months (around 03/13/2024) for a follow-up visit.  Sonda Primes, MD

## 2023-09-20 ENCOUNTER — Encounter: Payer: Self-pay | Admitting: Internal Medicine

## 2023-09-20 NOTE — Assessment & Plan Note (Addendum)
We discussed age appropriate health related issues, including available/recomended screening tests and vaccinations. We discussed a need for adhering to healthy diet and exercise. Labs were ordered to be later reviewed . All questions were answered. A cardiac CT scan for calcium scoring offered 8/20 Mammo q 12 mo Cologuard (-) 2019 Dexa offered 2020 Flu shot mammo

## 2023-12-13 ENCOUNTER — Encounter: Payer: Self-pay | Admitting: Hematology and Oncology

## 2023-12-14 ENCOUNTER — Other Ambulatory Visit: Payer: Self-pay | Admitting: *Deleted

## 2023-12-14 DIAGNOSIS — D473 Essential (hemorrhagic) thrombocythemia: Secondary | ICD-10-CM

## 2023-12-16 ENCOUNTER — Telehealth: Payer: Self-pay | Admitting: Hematology and Oncology

## 2023-12-24 ENCOUNTER — Inpatient Hospital Stay: Payer: Medicare Other | Attending: Internal Medicine

## 2023-12-24 DIAGNOSIS — D473 Essential (hemorrhagic) thrombocythemia: Secondary | ICD-10-CM | POA: Diagnosis not present

## 2023-12-24 LAB — CMP (CANCER CENTER ONLY)
ALT: 6 U/L (ref 0–44)
AST: 14 U/L — ABNORMAL LOW (ref 15–41)
Albumin: 4 g/dL (ref 3.5–5.0)
Alkaline Phosphatase: 50 U/L (ref 38–126)
Anion gap: 5 (ref 5–15)
BUN: 9 mg/dL (ref 8–23)
CO2: 29 mmol/L (ref 22–32)
Calcium: 9.5 mg/dL (ref 8.9–10.3)
Chloride: 105 mmol/L (ref 98–111)
Creatinine: 0.92 mg/dL (ref 0.44–1.00)
GFR, Estimated: >60 mL/min (ref >60)
Glucose, Bld: 83 mg/dL (ref 70–99)
Potassium: 4.2 mmol/L (ref 3.5–5.1)
Sodium: 139 mmol/L (ref 135–145)
Total Bilirubin: 0.6 mg/dL (ref <1.2)
Total Protein: 6.9 g/dL (ref 6.5–8.1)

## 2023-12-24 LAB — CBC WITH DIFFERENTIAL (CANCER CENTER ONLY)
Abs Immature Granulocytes: 0.01 10*3/uL (ref 0.00–0.07)
Basophils Absolute: 0 10*3/uL (ref 0.0–0.1)
Basophils Relative: 1 %
Eosinophils Absolute: 0.1 10*3/uL (ref 0.0–0.5)
Eosinophils Relative: 2 %
HCT: 28.5 % — ABNORMAL LOW (ref 36.0–46.0)
Hemoglobin: 9.8 g/dL — ABNORMAL LOW (ref 12.0–15.0)
Immature Granulocytes: 0 %
Lymphocytes Relative: 17 %
Lymphs Abs: 0.7 10*3/uL (ref 0.7–4.0)
MCH: 36.6 pg — ABNORMAL HIGH (ref 26.0–34.0)
MCHC: 34.4 g/dL (ref 30.0–36.0)
MCV: 106.3 fL — ABNORMAL HIGH (ref 80.0–100.0)
Monocytes Absolute: 0.5 10*3/uL (ref 0.1–1.0)
Monocytes Relative: 12 %
Neutro Abs: 2.7 10*3/uL (ref 1.7–7.7)
Neutrophils Relative %: 68 %
Platelet Count: 234 10*3/uL (ref 150–400)
RBC: 2.68 MIL/uL — ABNORMAL LOW (ref 3.87–5.11)
RDW: 13 % (ref 11.5–15.5)
WBC Count: 3.9 10*3/uL — ABNORMAL LOW (ref 4.0–10.5)
nRBC: 0 % (ref 0.0–0.2)

## 2023-12-28 ENCOUNTER — Telehealth: Payer: Self-pay | Admitting: Hematology and Oncology

## 2023-12-30 ENCOUNTER — Telehealth: Payer: Self-pay | Admitting: Hematology and Oncology

## 2023-12-30 NOTE — Telephone Encounter (Signed)
 Marland Kitchen

## 2024-01-06 ENCOUNTER — Inpatient Hospital Stay: Payer: Medicare Other | Attending: Internal Medicine | Admitting: Hematology and Oncology

## 2024-01-06 ENCOUNTER — Inpatient Hospital Stay: Payer: Medicare Other

## 2024-01-06 VITALS — BP 122/76 | HR 59 | Temp 97.3°F | Resp 14 | Wt 109.7 lb

## 2024-01-06 DIAGNOSIS — E876 Hypokalemia: Secondary | ICD-10-CM | POA: Diagnosis not present

## 2024-01-06 DIAGNOSIS — Z7982 Long term (current) use of aspirin: Secondary | ICD-10-CM | POA: Insufficient documentation

## 2024-01-06 DIAGNOSIS — D539 Nutritional anemia, unspecified: Secondary | ICD-10-CM | POA: Diagnosis not present

## 2024-01-06 DIAGNOSIS — D473 Essential (hemorrhagic) thrombocythemia: Secondary | ICD-10-CM

## 2024-01-06 DIAGNOSIS — D75839 Thrombocytosis, unspecified: Secondary | ICD-10-CM | POA: Diagnosis not present

## 2024-01-06 LAB — CBC WITH DIFFERENTIAL (CANCER CENTER ONLY)
Abs Immature Granulocytes: 0.03 10*3/uL (ref 0.00–0.07)
Basophils Absolute: 0 10*3/uL (ref 0.0–0.1)
Basophils Relative: 0 %
Eosinophils Absolute: 0.1 10*3/uL (ref 0.0–0.5)
Eosinophils Relative: 1 %
HCT: 32.5 % — ABNORMAL LOW (ref 36.0–46.0)
Hemoglobin: 10.8 g/dL — ABNORMAL LOW (ref 12.0–15.0)
Immature Granulocytes: 0 %
Lymphocytes Relative: 10 %
Lymphs Abs: 0.7 10*3/uL (ref 0.7–4.0)
MCH: 34.1 pg — ABNORMAL HIGH (ref 26.0–34.0)
MCHC: 33.2 g/dL (ref 30.0–36.0)
MCV: 102.5 fL — ABNORMAL HIGH (ref 80.0–100.0)
Monocytes Absolute: 0.4 10*3/uL (ref 0.1–1.0)
Monocytes Relative: 6 %
Neutro Abs: 5.5 10*3/uL (ref 1.7–7.7)
Neutrophils Relative %: 83 %
Platelet Count: 481 10*3/uL — ABNORMAL HIGH (ref 150–400)
RBC: 3.17 MIL/uL — ABNORMAL LOW (ref 3.87–5.11)
RDW: 13.2 % (ref 11.5–15.5)
WBC Count: 6.7 10*3/uL (ref 4.0–10.5)
nRBC: 0 % (ref 0.0–0.2)

## 2024-01-06 LAB — CMP (CANCER CENTER ONLY)
ALT: 8 U/L (ref 0–44)
AST: 16 U/L (ref 15–41)
Albumin: 4.1 g/dL (ref 3.5–5.0)
Alkaline Phosphatase: 54 U/L (ref 38–126)
Anion gap: 7 (ref 5–15)
BUN: 13 mg/dL (ref 8–23)
CO2: 29 mmol/L (ref 22–32)
Calcium: 9.8 mg/dL (ref 8.9–10.3)
Chloride: 105 mmol/L (ref 98–111)
Creatinine: 1.09 mg/dL — ABNORMAL HIGH (ref 0.44–1.00)
GFR, Estimated: 54 mL/min — ABNORMAL LOW (ref >60)
Glucose, Bld: 98 mg/dL (ref 70–99)
Potassium: 4.2 mmol/L (ref 3.5–5.1)
Sodium: 141 mmol/L (ref 135–145)
Total Bilirubin: 0.6 mg/dL (ref 0.0–1.2)
Total Protein: 7.3 g/dL (ref 6.5–8.1)

## 2024-01-06 MED ORDER — HYDROXYUREA 500 MG PO CAPS: 500.0000 mg | ORAL_CAPSULE | ORAL | 1 refills | Status: AC

## 2024-01-06 NOTE — Progress Notes (Signed)
 [facility] [phone]   [phone]  PROGRESS NOTE  Patient Care Team: Plotnikov, Karlynn GAILS, MD as PCP - General (Internal Medicine)  Hematological/Oncological History # Essential Thrombocytosis, JAK2 positive  11/13/2021: WBC 12.5, Hgb 14.5, MCV 79.1, Plt 852 07/01/2022: WBC 10.2, Hgb 13.2, MCV 79.5, Plt 817 07/27/2022: establish care with Dr. Federico  10/15/2022: start of hydroxyurea  500 mg PO daily.  11/18/2022: increase to hydroxyurea  500 mg BID 12/17/2022: WBC 8.4, Hgb 12.3, MCV 80.6, Plt 565  Interval History:  Janet Sherman 73 y.o. female with medical history significant for essential thrombocytosis who presents for a follow up visit. The patient's last visit was on 05/11/2023. In the interim since the last visit she has continued hydroxyurea  500 mg PO daily.  She has had numerous no-shows in the interim.  On exam today Janet Sherman accompanied by her granddaughter today and her daughter by phone.  She reports that she is been holding her hydroxyurea  since her last labs on 12/24/2023.  She reports her energy levels have been good at 10 out of 10.  She reports her appetite is been off-and-on.  She does not has not had any issues with bleeding, bruising, or dark stools.  She denies any ulcers in her mouth or ankles.  She reports that she is tolerating the hydroxyurea  therapy well with no stomach upset.  She has no recent illnesses such as runny nose, sore throat, or cough.  She continues taking the aspirin  daily.  She had been taking 1 pill a day when her hemoglobin had dropped to 9.8.  Otherwise she has had no major changes in her health in the interim since our last discussion.. Overall she is willing and able to proceed with hydroxyurea  therapy at this time.  A full 10 point ROS was otherwise negative.   MEDICAL HISTORY:  Past Medical History:  Diagnosis Date   Arthritis    right hand (01/22/2017)   Gallstones    History of kidney stones     Hypertension    Migraine    a few/year (01/22/2017)   Pneumonia X 1    SURGICAL HISTORY: Past Surgical History:  Procedure Laterality Date   CRANIOTOMY N/A 10/16/2016   Procedure: Pituitary tumor (benign) resection - CRANIOTOMY HYPOPHYSECTOMY TRANSNASAL APPROACH;  Surgeon: Gerldine Maizes, MD;  Location: [facility] OR;  Service: Neurosurgery;  Laterality: N/A;   ELECTROPHYSIOLOGIC STUDY N/A 01/22/2017   Procedure: SVT Ablation;  Surgeon: Will Gladis Norton, MD;  Location: [facility] INVASIVE CV LAB;  Service: Cardiovascular;  Laterality: N/A;   ELECTROPHYSIOLOGIC STUDY N/A 01/22/2017   Procedure: Electrophysiology Study;  Surgeon: Will Gladis Norton, MD;  Location: [facility] INVASIVE CV LAB;  Service: Cardiovascular;  Laterality: N/A;   gallstones  ~ 2000   KIDNEY STONE SURGERY     cut me open   SUPRAVENTRICULAR TACHYCARDIA ABLATION  01/22/2017   TRANSNASAL APPROACH N/A 10/16/2016   Procedure: TRANSNASAL APPROACH WITH FUSION;  Surgeon: Alm Bouche, MD;  Location: [facility];  Service: ENT;  Laterality: N/A;   TUBAL LIGATION      SOCIAL HISTORY: Social History   Socioeconomic History   Marital status: Widowed    Spouse name: Lynwood   Number of children: 5   Years of education: 10   Highest education level: Not on file  Occupational History    Employer: [employer]    Comment: [employer]  Tobacco Use   Smoking status: Former    Current packs/day: 0.00    Types:  Cigarettes    Quit date: 10/16/2016    Years since quitting: 7.2   Smokeless tobacco: Never  Vaping Use   Vaping status: Never Used  Substance and Sexual Activity   Alcohol use: No   Drug use: No   Sexual activity: Never  Other Topics Concern   Not on file  Social History Narrative   Patient is married(James) and lives at home with her husband.   Patient has 5 children.   Patient works full-time.   Patient has a 10th  Grade education.   Patient is right handed.   Patient drinks 2-3 cups of coffee  daily.   Social Drivers of Corporate Investment Banker Strain: Not on file  Food Insecurity: Not on file  Transportation Needs: Not on file  Physical Activity: Not on file  Stress: Not on file  Social Connections: Not on file  Intimate Partner Violence: Not on file    FAMILY HISTORY: Family History  Problem Relation Age of Onset   Hypertension Mother    Cancer Father        lung   Hypertension Sister    Hypertension Brother     ALLERGIES:  is allergic to mold extract [trichophyton] and pollen extract.  MEDICATIONS:  Current Outpatient Medications  Medication Sig Dispense Refill   hydroxyurea  (HYDREA ) 500 MG capsule Take 1 capsule (500 mg total) by mouth as directed. May take with food to minimize GI side effects. Take 1 pill daily Monday Through Friday, do not take on Saturday/Sunday 90 capsule 1   aspirin  81 MG chewable tablet Chew 1 tablet (81 mg total) by mouth 2 (two) times daily. 100 tablet 0   Cholecalciferol (VITAMIN D3) 50 MCG (2000 UT) capsule Take 1 capsule (2,000 Units total) by mouth daily. (Patient not taking: Reported on 05/11/2023) 100 capsule 3   cyanocobalamin  (VITAMIN B12) 100 MCG tablet Take 100 mcg by mouth daily. (Patient not taking: Reported on 05/11/2023)     donepezil  (ARICEPT ) 10 MG tablet Take 1 tablet (10 mg total) by mouth at bedtime. 90 tablet 3   meloxicam  (MOBIC ) 7.5 MG tablet Take 1 tab po every day pc x 10 days, then prn 30 tablet 1   No current facility-administered medications for this visit.    REVIEW OF SYSTEMS:   Constitutional: ( - ) fevers, ( - )  chills , ( - ) night sweats Eyes: ( - ) blurriness of vision, ( - ) double vision, ( - ) watery eyes Ears, nose, mouth, throat, and face: ( - ) mucositis, ( - ) sore throat Respiratory: ( - ) cough, ( - ) dyspnea, ( - ) wheezes Cardiovascular: ( - ) palpitation, ( - ) chest discomfort, ( - ) lower extremity swelling Gastrointestinal:  ( - ) nausea, ( - ) heartburn, ( - ) change in bowel  habits Skin: ( - ) abnormal skin rashes Lymphatics: ( - ) new lymphadenopathy, ( - ) easy bruising Neurological: ( - ) numbness, ( - ) tingling, ( - ) new weaknesses Behavioral/Psych: ( - ) mood change, ( - ) new changes  All other systems were reviewed with the patient and are negative.  PHYSICAL EXAMINATION: ECOG PERFORMANCE STATUS: 0 - Asymptomatic  Vitals:   01/06/24 0844  BP: 122/76  Pulse: (!) 59  Resp: 14  Temp: (!) 97.3 F (36.3 C)  SpO2: 100%      Filed Weights   01/06/24 0844  Weight: 109 lb 11.2 oz (49.8 kg)  GENERAL: Well-appearing elderly African-American female, alert, no distress and comfortable SKIN: skin color, texture, turgor are normal, no rashes or significant lesions EYES: conjunctiva are pink and non-injected, sclera clear LUNGS: clear to auscultation and percussion with normal breathing effort HEART: regular rate & rhythm and no murmurs and no lower extremity edema Musculoskeletal: no cyanosis of digits and no clubbing  PSYCH: alert & oriented x 3, fluent speech NEURO: no focal motor/sensory deficits  LABORATORY DATA:  I have reviewed the data as listed    Latest Ref Rng & Units 01/06/2024    8:18 AM 12/24/2023    1:59 PM 09/14/2023    2:46 PM  CBC  WBC 4.0 - 10.5 K/uL 6.7  3.9  5.9   Hemoglobin 12.0 - 15.0 g/dL 89.1  9.8  88.3   Hematocrit 36.0 - 46.0 % 32.5  28.5  35.4   Platelets 150 - 400 K/uL 481  234  316.0        Latest Ref Rng & Units 01/06/2024    8:18 AM 12/24/2023    1:59 PM 09/14/2023    2:46 PM  CMP  Glucose 70 - 99 mg/dL 98  83  96   BUN 8 - 23 mg/dL 13  9  15    Creatinine 0.44 - 1.00 mg/dL 8.90  9.07  8.86   Sodium 135 - 145 mmol/L 141  139  139   Potassium 3.5 - 5.1 mmol/L 4.2  4.2  4.0   Chloride 98 - 111 mmol/L 105  105  103   CO2 22 - 32 mmol/L 29  29  28    Calcium  8.9 - 10.3 mg/dL 9.8  9.5  9.2   Total Protein 6.5 - 8.1 g/dL 7.3  6.9  7.1   Total Bilirubin 0.0 - 1.2 mg/dL 0.6  0.6  0.5   Alkaline Phos 38 -  126 U/L 54  50  61   AST 15 - 41 U/L 16  14  15    ALT 0 - 44 U/L 8  6  7      RADIOGRAPHIC STUDIES: No results found.  ASSESSMENT & PLAN Janet Sherman is a 73 y.o. female with medical history significant for essential thrombocytosis who presents for a follow up visit.   Today we discussed the diagnosis of essential thrombocytosis.  Essential thrombocytosis and myeloproliferative neoplasm by the patient has overproduction of a particular blood cell.  In this case the patient has a JAK2 mutation which is causing thrombocytosis.  Goals of treatment including monitoring for progression to myelofibrosis and reducing risk of VTE.  Recommend aspirin  81 mg p.o. daily and cytoreductive therapy with hydroxyurea .  #Essential Thrombocytosis, JAK2 Positive -- Diagnosis confirmed by the presence of JAK2 and bone marrow biopsy showing myeloproliferative neoplasm. -- Patient currently on 81 mg baby aspirin  daily, recommend continuation of this -- Labs today show white blood cell count 6.7, Hgb 10.8, MCV 102.5, Plt 481.  -- Currently taking Hydroxyurea  500 mg daily. Stopped due to Hgb 9.8 on 12/24/2023. Recommend restart daily M-F and holding medication on Sat-Sun  -- Return to clinic in 4 weeks time for evaluation on hydroxyurea  therapy.  #Hypokalemia: -Potassium is 4.2 today.  -Currently on potassium chloride  20 mEq BID, recommend to continue  #Macrocytic anemia: --Checked vitamin B12 and folate levels today due to acute onset of anemia, both normal  #Elevated LFTs-resolved --AST 16, ALT 8.    No orders of the defined types were placed in this encounter.   All questions were  answered. The patient knows to call the clinic with any problems, questions or concerns.  I have spent a total of 30 minutes minutes of face-to-face and non-face-to-face time, preparing to see the patient, performing a medically appropriate examination, counseling and educating the patient,  documenting clinical information in  the electronic health record, and care coordination.   Norleen IVAR Kidney, MD Department of Hematology/Oncology [facility] Phone: [phone] Pager: [phone] Email: [email or web address]    01/06/2024 9:40 AM

## 2024-01-20 ENCOUNTER — Other Ambulatory Visit: Payer: Self-pay | Admitting: Hematology and Oncology

## 2024-01-20 ENCOUNTER — Inpatient Hospital Stay: Payer: Medicare Other

## 2024-01-20 DIAGNOSIS — D473 Essential (hemorrhagic) thrombocythemia: Secondary | ICD-10-CM

## 2024-02-03 ENCOUNTER — Inpatient Hospital Stay: Payer: Medicare Other | Attending: Internal Medicine

## 2024-02-03 DIAGNOSIS — D473 Essential (hemorrhagic) thrombocythemia: Secondary | ICD-10-CM | POA: Insufficient documentation

## 2024-02-03 LAB — CBC WITH DIFFERENTIAL (CANCER CENTER ONLY)
Abs Immature Granulocytes: 0.01 10*3/uL (ref 0.00–0.07)
Basophils Absolute: 0 10*3/uL (ref 0.0–0.1)
Basophils Relative: 1 %
Eosinophils Absolute: 0.1 10*3/uL (ref 0.0–0.5)
Eosinophils Relative: 2 %
HCT: 34.3 % — ABNORMAL LOW (ref 36.0–46.0)
Hemoglobin: 11.2 g/dL — ABNORMAL LOW (ref 12.0–15.0)
Immature Granulocytes: 0 %
Lymphocytes Relative: 11 %
Lymphs Abs: 0.6 10*3/uL — ABNORMAL LOW (ref 0.7–4.0)
MCH: 31.5 pg (ref 26.0–34.0)
MCHC: 32.7 g/dL (ref 30.0–36.0)
MCV: 96.6 fL (ref 80.0–100.0)
Monocytes Absolute: 0.4 10*3/uL (ref 0.1–1.0)
Monocytes Relative: 8 %
Neutro Abs: 4.1 10*3/uL (ref 1.7–7.7)
Neutrophils Relative %: 78 %
Platelet Count: 391 10*3/uL (ref 150–400)
RBC: 3.55 MIL/uL — ABNORMAL LOW (ref 3.87–5.11)
RDW: 14.5 % (ref 11.5–15.5)
WBC Count: 5.2 10*3/uL (ref 4.0–10.5)
nRBC: 0 % (ref 0.0–0.2)

## 2024-02-03 LAB — CMP (CANCER CENTER ONLY)
ALT: 9 U/L (ref 0–44)
AST: 15 U/L (ref 15–41)
Albumin: 4 g/dL (ref 3.5–5.0)
Alkaline Phosphatase: 50 U/L (ref 38–126)
Anion gap: 6 (ref 5–15)
BUN: 10 mg/dL (ref 8–23)
CO2: 30 mmol/L (ref 22–32)
Calcium: 9.5 mg/dL (ref 8.9–10.3)
Chloride: 105 mmol/L (ref 98–111)
Creatinine: 0.92 mg/dL (ref 0.44–1.00)
GFR, Estimated: >60 mL/min (ref >60)
Glucose, Bld: 95 mg/dL (ref 70–99)
Potassium: 4.7 mmol/L (ref 3.5–5.1)
Sodium: 141 mmol/L (ref 135–145)
Total Bilirubin: 0.6 mg/dL (ref 0.0–1.2)
Total Protein: 7.2 g/dL (ref 6.5–8.1)

## 2024-02-07 ENCOUNTER — Encounter: Payer: Self-pay | Admitting: Physician Assistant

## 2024-02-24 ENCOUNTER — Other Ambulatory Visit: Payer: Self-pay | Admitting: Physician Assistant

## 2024-02-24 DIAGNOSIS — D473 Essential (hemorrhagic) thrombocythemia: Secondary | ICD-10-CM

## 2024-03-03 ENCOUNTER — Encounter: Payer: Self-pay | Admitting: Physician Assistant

## 2024-03-03 ENCOUNTER — Inpatient Hospital Stay: Payer: Medicare Other

## 2024-03-03 ENCOUNTER — Inpatient Hospital Stay: Payer: Medicare Other | Attending: Internal Medicine | Admitting: Physician Assistant

## 2024-03-03 VITALS — BP 133/80 | HR 61 | Temp 97.2°F | Resp 13 | Wt 108.4 lb

## 2024-03-03 DIAGNOSIS — D473 Essential (hemorrhagic) thrombocythemia: Secondary | ICD-10-CM | POA: Insufficient documentation

## 2024-03-03 DIAGNOSIS — Z7982 Long term (current) use of aspirin: Secondary | ICD-10-CM | POA: Diagnosis not present

## 2024-03-03 DIAGNOSIS — D539 Nutritional anemia, unspecified: Secondary | ICD-10-CM | POA: Diagnosis not present

## 2024-03-03 DIAGNOSIS — Z7964 Long term (current) use of myelosuppressive agent: Secondary | ICD-10-CM | POA: Insufficient documentation

## 2024-03-03 LAB — CMP (CANCER CENTER ONLY)
ALT: 9 U/L (ref 0–44)
AST: 14 U/L — ABNORMAL LOW (ref 15–41)
Albumin: 4.1 g/dL (ref 3.5–5.0)
Alkaline Phosphatase: 65 U/L (ref 38–126)
Anion gap: 5 (ref 5–15)
BUN: 16 mg/dL (ref 8–23)
CO2: 31 mmol/L (ref 22–32)
Calcium: 9.3 mg/dL (ref 8.9–10.3)
Chloride: 107 mmol/L (ref 98–111)
Creatinine: 1.07 mg/dL — ABNORMAL HIGH (ref 0.44–1.00)
GFR, Estimated: 56 mL/min — ABNORMAL LOW (ref >60)
Glucose, Bld: 101 mg/dL — ABNORMAL HIGH (ref 70–99)
Potassium: 4.5 mmol/L (ref 3.5–5.1)
Sodium: 143 mmol/L (ref 135–145)
Total Bilirubin: 0.5 mg/dL (ref 0.0–1.2)
Total Protein: 7 g/dL (ref 6.5–8.1)

## 2024-03-03 LAB — CBC WITH DIFFERENTIAL (CANCER CENTER ONLY)
Abs Immature Granulocytes: 0.01 10*3/uL (ref 0.00–0.07)
Basophils Absolute: 0.1 10*3/uL (ref 0.0–0.1)
Basophils Relative: 1 %
Eosinophils Absolute: 0.1 10*3/uL (ref 0.0–0.5)
Eosinophils Relative: 2 %
HCT: 34.7 % — ABNORMAL LOW (ref 36.0–46.0)
Hemoglobin: 11.6 g/dL — ABNORMAL LOW (ref 12.0–15.0)
Immature Granulocytes: 0 %
Lymphocytes Relative: 11 %
Lymphs Abs: 0.5 10*3/uL — ABNORMAL LOW (ref 0.7–4.0)
MCH: 30.9 pg (ref 26.0–34.0)
MCHC: 33.4 g/dL (ref 30.0–36.0)
MCV: 92.5 fL (ref 80.0–100.0)
Monocytes Absolute: 0.4 10*3/uL (ref 0.1–1.0)
Monocytes Relative: 7 %
Neutro Abs: 3.8 10*3/uL (ref 1.7–7.7)
Neutrophils Relative %: 79 %
Platelet Count: 325 10*3/uL (ref 150–400)
RBC: 3.75 MIL/uL — ABNORMAL LOW (ref 3.87–5.11)
RDW: 14.2 % (ref 11.5–15.5)
WBC Count: 4.9 10*3/uL (ref 4.0–10.5)
nRBC: 0 % (ref 0.0–0.2)

## 2024-03-03 NOTE — Progress Notes (Signed)
 [facility] [phone]   [phone]  PROGRESS NOTE  Patient Care Team: Plotnikov, Georgina Quint, MD as PCP - General (Internal Medicine)  Hematological/Oncological History # Essential Thrombocytosis, JAK2 positive  11/13/2021: WBC 12.5, Hgb 14.5, MCV 79.1, Plt 852 07/01/2022: WBC 10.2, Hgb 13.2, MCV 79.5, Plt 817 07/27/2022: establish care with Dr. Leonides Schanz  10/15/2022: start of hydroxyurea 500 mg PO daily.  11/18/2022: increase to hydroxyurea 500 mg BID 12/17/2022: WBC 8.4, Hgb 12.3, MCV 80.6, Plt 565 05/11/2023: WBC 4.5, hemoglobin 10.3, MCV 111.6, and platelets of 455  decrease dose due to anemia by taking medication twice daily on Monday, Wednesday and Friday. Then remaining days will be daily.  12/24/2023: Hgb 9.8, stopped hydrea therapy 01/06/2024: WBC  6.7, Hgb 10.8, MCV 102.5, Plt 481.  Recommend restart hydrea therapy daily M-F and holding medication on Sat-Sun    Interval History:  Janet Sherman 73 y.o. female with medical history significant for essential thrombocytosis who presents for a follow up visit. The patient's last visit was on 01/06/2024. In the interim since the last visit she has continued hydroxyurea therapy.   On exam today Janet Sherman is unaccompanied for this visit. She reports her energy levels are fairly stable. She is able to tolerate her hydrea therapy without any side effects. She has a good appetite and weight is unchanged since January 2025. She denies nausea, vomiting or bowel habit changes. She denies easy bruising or signs of bleeding.  She continues taking the aspirin daily. Otherwise she has had no major changes in her health in the interim since our last discussion. She denies fevers, chills, sweats, shortness of breath, chest pain or cough. Overall she is willing and able to proceed with hydroxyurea therapy at this time.  A full 10 point ROS was otherwise negative.   MEDICAL HISTORY:  Past Medical History:  Diagnosis  Date   Arthritis    "right hand" (01/22/2017)   Gallstones    History of kidney stones    Hypertension    Migraine    "a few/year" (01/22/2017)   Pneumonia X 1    SURGICAL HISTORY: Past Surgical History:  Procedure Laterality Date   CRANIOTOMY N/A 10/16/2016   Procedure: Pituitary tumor (benign) resection - CRANIOTOMY HYPOPHYSECTOMY TRANSNASAL APPROACH;  Surgeon: Lisbeth Renshaw, MD;  Location: [facility] OR;  Service: Neurosurgery;  Laterality: N/A;   ELECTROPHYSIOLOGIC STUDY N/A 01/22/2017   Procedure: SVT Ablation;  Surgeon: Will Jorja Loa, MD;  Location: [facility] INVASIVE CV LAB;  Service: Cardiovascular;  Laterality: N/A;   ELECTROPHYSIOLOGIC STUDY N/A 01/22/2017   Procedure: Electrophysiology Study;  Surgeon: Will Jorja Loa, MD;  Location: [facility] INVASIVE CV LAB;  Service: Cardiovascular;  Laterality: N/A;   gallstones  ~ 2000   KIDNEY STONE SURGERY     "cut me open"   SUPRAVENTRICULAR TACHYCARDIA ABLATION  01/22/2017   TRANSNASAL APPROACH N/A 10/16/2016   Procedure: TRANSNASAL APPROACH WITH FUSION;  Surgeon: Osborn Coho, MD;  Location: [facility] OR;  Service: ENT;  Laterality: N/A;   TUBAL LIGATION      SOCIAL HISTORY: Social History   Socioeconomic History   Marital status: Widowed    Spouse name: Fayrene Fearing   Number of children: 5   Years of education: 10   Highest education level: Not on file  Occupational History    Employer: [employer]    Comment: [employer]  Tobacco Use   Smoking status: Former    Current packs/day: 0.00    Types: Cigarettes  Quit date: 10/16/2016    Years since quitting: 7.3   Smokeless tobacco: Never  Vaping Use   Vaping status: Never Used  Substance and Sexual Activity   Alcohol use: No   Drug use: No   Sexual activity: Never  Other Topics Concern   Not on file  Social History Narrative   Patient is married(James) and lives at home with her husband.   Patient has 5 children.   Patient works full-time.   Patient has  a 10th  Grade education.   Patient is right handed.   Patient drinks 2-3 cups of coffee daily.   Social Drivers of Corporate investment banker Strain: Not on file  Food Insecurity: Not on file  Transportation Needs: Not on file  Physical Activity: Not on file  Stress: Not on file  Social Connections: Not on file  Intimate Partner Violence: Not on file    FAMILY HISTORY: Family History  Problem Relation Age of Onset   Hypertension Mother    Cancer Father        lung   Hypertension Sister    Hypertension Brother     ALLERGIES:  is allergic to mold extract [trichophyton] and pollen extract.  MEDICATIONS:  Current Outpatient Medications  Medication Sig Dispense Refill   aspirin 81 MG chewable tablet Chew 1 tablet (81 mg total) by mouth 2 (two) times daily. 100 tablet 0   Cholecalciferol (VITAMIN D3) 50 MCG (2000 UT) capsule Take 1 capsule (2,000 Units total) by mouth daily. (Patient not taking: Reported on 05/11/2023) 100 capsule 3   cyanocobalamin (VITAMIN B12) 100 MCG tablet Take 100 mcg by mouth daily. (Patient not taking: Reported on 05/11/2023)     donepezil (ARICEPT) 10 MG tablet Take 1 tablet (10 mg total) by mouth at bedtime. 90 tablet 3   hydroxyurea (HYDREA) 500 MG capsule Take 1 capsule (500 mg total) by mouth as directed. May take with food to minimize GI side effects. Take 1 pill daily Monday Through Friday, do not take on Saturday/Sunday 90 capsule 1   meloxicam (MOBIC) 7.5 MG tablet Take 1 tab po every day pc x 10 days, then prn 30 tablet 1   No current facility-administered medications for this visit.    REVIEW OF SYSTEMS:   Constitutional: ( - ) fevers, ( - )  chills , ( - ) night sweats Eyes: ( - ) blurriness of vision, ( - ) double vision, ( - ) watery eyes Ears, nose, mouth, throat, and face: ( - ) mucositis, ( - ) sore throat Respiratory: ( - ) cough, ( - ) dyspnea, ( - ) wheezes Cardiovascular: ( - ) palpitation, ( - ) chest discomfort, ( - ) lower  extremity swelling Gastrointestinal:  ( - ) nausea, ( - ) heartburn, ( - ) change in bowel habits Skin: ( - ) abnormal skin rashes Lymphatics: ( - ) new lymphadenopathy, ( - ) easy bruising Neurological: ( - ) numbness, ( - ) tingling, ( - ) new weaknesses Behavioral/Psych: ( - ) mood change, ( - ) new changes  All other systems were reviewed with the patient and are negative.  PHYSICAL EXAMINATION: ECOG PERFORMANCE STATUS: 0 - Asymptomatic  Vitals:   03/03/24 0846  BP: 133/80  Pulse: 61  Resp: 13  Temp: (!) 97.2 F (36.2 C)  SpO2: 100%      Filed Weights   03/03/24 0846  Weight: 108 lb 6.4 oz (49.2 kg)    GENERAL: Well-appearing  elderly African-American female, alert, no distress and comfortable SKIN: skin color, texture, turgor are normal, no rashes or significant lesions EYES: conjunctiva are pink and non-injected, sclera clear LUNGS: clear to auscultation and percussion with normal breathing effort HEART: regular rate & rhythm and no murmurs and no lower extremity edema Musculoskeletal: no cyanosis of digits and no clubbing  PSYCH: alert & oriented x 3, fluent speech NEURO: no focal motor/sensory deficits  LABORATORY DATA:  I have reviewed the data as listed    Latest Ref Rng & Units 03/03/2024    8:21 AM 02/03/2024    8:45 AM 01/06/2024    8:18 AM  CBC  WBC 4.0 - 10.5 K/uL 4.9  5.2  6.7   Hemoglobin 12.0 - 15.0 g/dL 16.1  09.6  04.5   Hematocrit 36.0 - 46.0 % 34.7  34.3  32.5   Platelets 150 - 400 K/uL 325  391  481        Latest Ref Rng & Units 03/03/2024    8:21 AM 02/03/2024    8:45 AM 01/06/2024    8:18 AM  CMP  Glucose 70 - 99 mg/dL 409  95  98   BUN 8 - 23 mg/dL 16  10  13    Creatinine 0.44 - 1.00 mg/dL 8.11  9.14  7.82   Sodium 135 - 145 mmol/L 143  141  141   Potassium 3.5 - 5.1 mmol/L 4.5  4.7  4.2   Chloride 98 - 111 mmol/L 107  105  105   CO2 22 - 32 mmol/L 31  30  29    Calcium 8.9 - 10.3 mg/dL 9.3  9.5  9.8   Total Protein 6.5 - 8.1 g/dL 7.0  7.2   7.3   Total Bilirubin 0.0 - 1.2 mg/dL 0.5  0.6  0.6   Alkaline Phos 38 - 126 U/L 65  50  54   AST 15 - 41 U/L 14  15  16    ALT 0 - 44 U/L 9  9  8      RADIOGRAPHIC STUDIES: No results found.  ASSESSMENT & PLAN Janet Sherman is a 73 y.o. female with medical history significant for essential thrombocytosis who presents for a follow up visit.   Today we discussed the diagnosis of essential thrombocytosis.  Essential thrombocytosis and myeloproliferative neoplasm by the patient has overproduction of a particular blood cell.  In this case the patient has a JAK2 mutation which is causing thrombocytosis.  Goals of treatment including monitoring for progression to myelofibrosis and reducing risk of VTE.  Recommend aspirin 81 mg p.o. daily and cytoreductive therapy with hydroxyurea.  #Essential Thrombocytosis, JAK2 Positive -- Diagnosis confirmed by the presence of JAK2 and bone marrow biopsy showing myeloproliferative neoplasm. -- Patient currently on 81 mg baby aspirin daily, recommend continuation of this -- Labs today show white blood cell count 4.9, Hgb 11.6, MCV 92.5, Plt 325.  -- Currently taking Hydroxyurea 500 mg daily M-F and holding medication on Sat-Sun. Continue without any further dose modifications.  -- Return to clinic in 3 months for evaluation on hydroxyurea therapy.  #Macrocytic anemia: --Checked vitamin B12 and folate levels today due to acute onset of anemia, both normal   No orders of the defined types were placed in this encounter.   All questions were answered. The patient knows to call the clinic with any problems, questions or concerns.  I have spent a total of 30 minutes minutes of face-to-face and non-face-to-face time, preparing to see  the patient, performing a medically appropriate examination, counseling and educating the patient,  documenting clinical information in the electronic health record, and care coordination.   Georga Kaufmann PA-C Dept of Hematology and  Oncology [facility] Phone: [phone]     03/03/2024 9:42 AM

## 2024-03-16 ENCOUNTER — Telehealth: Payer: Self-pay

## 2024-03-23 NOTE — Telephone Encounter (Signed)
 k

## 2024-05-15 ENCOUNTER — Observation Stay (HOSPITAL_COMMUNITY)

## 2024-05-15 ENCOUNTER — Emergency Department (HOSPITAL_COMMUNITY)

## 2024-05-15 ENCOUNTER — Other Ambulatory Visit: Payer: Self-pay

## 2024-05-15 ENCOUNTER — Observation Stay (HOSPITAL_COMMUNITY)
Admission: EM | Admit: 2024-05-15 | Discharge: 2024-05-16 | Disposition: A | Discharge status: Home / Self Care | Attending: Obstetrics and Gynecology | Admitting: Obstetrics and Gynecology

## 2024-05-15 ENCOUNTER — Encounter (HOSPITAL_COMMUNITY): Payer: Self-pay | Admitting: Emergency Medicine

## 2024-05-15 DIAGNOSIS — F02818 Dementia in other diseases classified elsewhere, unspecified severity, with other behavioral disturbance: Secondary | ICD-10-CM | POA: Diagnosis not present

## 2024-05-15 DIAGNOSIS — J322 Chronic ethmoidal sinusitis: Secondary | ICD-10-CM | POA: Diagnosis not present

## 2024-05-15 DIAGNOSIS — R29898 Other symptoms and signs involving the musculoskeletal system: Secondary | ICD-10-CM | POA: Diagnosis not present

## 2024-05-15 DIAGNOSIS — R42 Dizziness and giddiness: Secondary | ICD-10-CM | POA: Diagnosis present

## 2024-05-15 DIAGNOSIS — R9431 Abnormal electrocardiogram [ECG] [EKG]: Secondary | ICD-10-CM | POA: Insufficient documentation

## 2024-05-15 DIAGNOSIS — G319 Degenerative disease of nervous system, unspecified: Secondary | ICD-10-CM | POA: Diagnosis not present

## 2024-05-15 DIAGNOSIS — Z79899 Other long term (current) drug therapy: Secondary | ICD-10-CM | POA: Insufficient documentation

## 2024-05-15 DIAGNOSIS — Z7982 Long term (current) use of aspirin: Secondary | ICD-10-CM | POA: Insufficient documentation

## 2024-05-15 DIAGNOSIS — Z1152 Encounter for screening for COVID-19: Secondary | ICD-10-CM | POA: Diagnosis not present

## 2024-05-15 DIAGNOSIS — Z87891 Personal history of nicotine dependence: Secondary | ICD-10-CM | POA: Insufficient documentation

## 2024-05-15 DIAGNOSIS — G309 Alzheimer's disease, unspecified: Secondary | ICD-10-CM | POA: Insufficient documentation

## 2024-05-15 DIAGNOSIS — R200 Anesthesia of skin: Secondary | ICD-10-CM | POA: Diagnosis not present

## 2024-05-15 DIAGNOSIS — R509 Fever, unspecified: Secondary | ICD-10-CM | POA: Diagnosis present

## 2024-05-15 DIAGNOSIS — M19011 Primary osteoarthritis, right shoulder: Secondary | ICD-10-CM | POA: Diagnosis not present

## 2024-05-15 DIAGNOSIS — J189 Pneumonia, unspecified organism: Secondary | ICD-10-CM | POA: Insufficient documentation

## 2024-05-15 DIAGNOSIS — I10 Essential (primary) hypertension: Secondary | ICD-10-CM | POA: Diagnosis present

## 2024-05-15 DIAGNOSIS — J439 Emphysema, unspecified: Secondary | ICD-10-CM | POA: Diagnosis not present

## 2024-05-15 DIAGNOSIS — D473 Essential (hemorrhagic) thrombocythemia: Secondary | ICD-10-CM | POA: Diagnosis not present

## 2024-05-15 DIAGNOSIS — M79601 Pain in right arm: Secondary | ICD-10-CM | POA: Diagnosis not present

## 2024-05-15 DIAGNOSIS — R918 Other nonspecific abnormal finding of lung field: Secondary | ICD-10-CM | POA: Diagnosis not present

## 2024-05-15 DIAGNOSIS — E876 Hypokalemia: Secondary | ICD-10-CM | POA: Diagnosis not present

## 2024-05-15 DIAGNOSIS — M25511 Pain in right shoulder: Secondary | ICD-10-CM | POA: Insufficient documentation

## 2024-05-15 DIAGNOSIS — A419 Sepsis, unspecified organism: Secondary | ICD-10-CM | POA: Insufficient documentation

## 2024-05-15 DIAGNOSIS — F0280 Dementia in other diseases classified elsewhere without behavioral disturbance: Secondary | ICD-10-CM | POA: Insufficient documentation

## 2024-05-15 DIAGNOSIS — R6510 Systemic inflammatory response syndrome (SIRS) of non-infectious origin without acute organ dysfunction: Secondary | ICD-10-CM

## 2024-05-15 DIAGNOSIS — R531 Weakness: Secondary | ICD-10-CM | POA: Diagnosis not present

## 2024-05-15 DIAGNOSIS — R000 Tachycardia, unspecified: Secondary | ICD-10-CM | POA: Insufficient documentation

## 2024-05-15 DIAGNOSIS — J449 Chronic obstructive pulmonary disease, unspecified: Secondary | ICD-10-CM | POA: Diagnosis not present

## 2024-05-15 DIAGNOSIS — N179 Acute kidney failure, unspecified: Secondary | ICD-10-CM | POA: Insufficient documentation

## 2024-05-15 DIAGNOSIS — G8929 Other chronic pain: Secondary | ICD-10-CM | POA: Diagnosis not present

## 2024-05-15 DIAGNOSIS — J324 Chronic pansinusitis: Secondary | ICD-10-CM | POA: Diagnosis not present

## 2024-05-15 LAB — RESPIRATORY PANEL BY PCR

## 2024-05-15 LAB — COMPREHENSIVE METABOLIC PANEL WITH GFR
ALT: 10 U/L (ref 0–44)
ALT: 8 U/L (ref 0–44)
AST: 22 U/L (ref 15–41)
AST: 17 U/L (ref 15–41)
Albumin: 3.6 g/dL (ref 3.5–5.0)
Albumin: 2.6 g/dL — ABNORMAL LOW (ref 3.5–5.0)
Alkaline Phosphatase: 42 U/L (ref 38–126)
Alkaline Phosphatase: 35 U/L — ABNORMAL LOW (ref 38–126)
Anion gap: 13 (ref 5–15)
Anion gap: 9 (ref 5–15)
BUN: 11 mg/dL (ref 8–23)
BUN: 10 mg/dL (ref 8–23)
CO2: 22 mmol/L (ref 22–32)
CO2: 22 mmol/L (ref 22–32)
Calcium: 9.4 mg/dL (ref 8.9–10.3)
Calcium: 8.5 mg/dL — ABNORMAL LOW (ref 8.9–10.3)
Chloride: 102 mmol/L (ref 98–111)
Chloride: 106 mmol/L (ref 98–111)
Creatinine, Ser: 1.16 mg/dL — ABNORMAL HIGH (ref 0.44–1.00)
Creatinine, Ser: 1.06 mg/dL — ABNORMAL HIGH (ref 0.44–1.00)
GFR, Estimated: 50 mL/min — ABNORMAL LOW (ref >60)
GFR, Estimated: 56 mL/min — ABNORMAL LOW (ref >60)
Glucose, Bld: 131 mg/dL — ABNORMAL HIGH (ref 70–99)
Glucose, Bld: 143 mg/dL — ABNORMAL HIGH (ref 70–99)
Potassium: 3.3 mmol/L — ABNORMAL LOW (ref 3.5–5.1)
Potassium: 3.3 mmol/L — ABNORMAL LOW (ref 3.5–5.1)
Sodium: 137 mmol/L (ref 135–145)
Sodium: 137 mmol/L (ref 135–145)
Total Bilirubin: 0.8 mg/dL (ref 0.0–1.2)
Total Bilirubin: 1 mg/dL (ref 0.0–1.2)
Total Protein: 6.9 g/dL (ref 6.5–8.1)
Total Protein: 5.1 g/dL — ABNORMAL LOW (ref 6.5–8.1)

## 2024-05-15 LAB — RESP PANEL BY RT-PCR (RSV, FLU A&B, COVID)  RVPGX2
Influenza A by PCR: NEGATIVE
Influenza B by PCR: NEGATIVE
Resp Syncytial Virus by PCR: NEGATIVE
SARS Coronavirus 2 by RT PCR: NEGATIVE

## 2024-05-15 LAB — CBC
HCT: 29.9 % — ABNORMAL LOW (ref 36.0–46.0)
Hemoglobin: 9.8 g/dL — ABNORMAL LOW (ref 12.0–15.0)
MCH: 30.3 pg (ref 26.0–34.0)
MCHC: 32.8 g/dL (ref 30.0–36.0)
MCV: 92.6 fL (ref 80.0–100.0)
Platelets: 291 10*3/uL (ref 150–400)
RBC: 3.23 MIL/uL — ABNORMAL LOW (ref 3.87–5.11)
RDW: 14.5 % (ref 11.5–15.5)
WBC: 11.6 10*3/uL — ABNORMAL HIGH (ref 4.0–10.5)
nRBC: 0 % (ref 0.0–0.2)

## 2024-05-15 LAB — CBC WITH DIFFERENTIAL/PLATELET
Abs Immature Granulocytes: 0.08 10*3/uL — ABNORMAL HIGH (ref 0.00–0.07)
Basophils Absolute: 0 10*3/uL (ref 0.0–0.1)
Basophils Relative: 0 %
Eosinophils Absolute: 0 10*3/uL (ref 0.0–0.5)
Eosinophils Relative: 0 %
HCT: 36.5 % (ref 36.0–46.0)
Hemoglobin: 12.3 g/dL (ref 12.0–15.0)
Immature Granulocytes: 1 %
Lymphocytes Relative: 2 %
Lymphs Abs: 0.2 10*3/uL — ABNORMAL LOW (ref 0.7–4.0)
MCH: 30.7 pg (ref 26.0–34.0)
MCHC: 33.7 g/dL (ref 30.0–36.0)
MCV: 91 fL (ref 80.0–100.0)
Monocytes Absolute: 0.4 10*3/uL (ref 0.1–1.0)
Monocytes Relative: 4 %
Neutro Abs: 11.1 10*3/uL — ABNORMAL HIGH (ref 1.7–7.7)
Neutrophils Relative %: 93 %
Platelets: 369 10*3/uL (ref 150–400)
RBC: 4.01 MIL/uL (ref 3.87–5.11)
RDW: 14.5 % (ref 11.5–15.5)
WBC: 11.9 10*3/uL — ABNORMAL HIGH (ref 4.0–10.5)
nRBC: 0 % (ref 0.0–0.2)

## 2024-05-15 LAB — PROTIME-INR
INR: 1.2 (ref 0.8–1.2)
Prothrombin Time: 15.7 s — ABNORMAL HIGH (ref 11.4–15.2)

## 2024-05-15 LAB — URINALYSIS, W/ REFLEX TO CULTURE (INFECTION SUSPECTED)
Bilirubin Urine: NEGATIVE
Glucose, UA: NEGATIVE mg/dL
Ketones, ur: NEGATIVE mg/dL
Nitrite: NEGATIVE
Protein, ur: NEGATIVE mg/dL
Specific Gravity, Urine: 1.008 (ref 1.005–1.030)
pH: 7 (ref 5.0–8.0)

## 2024-05-15 LAB — TROPONIN I (HIGH SENSITIVITY)
Troponin I (High Sensitivity): 13 ng/L (ref <18)
Troponin I (High Sensitivity): 12 ng/L (ref <18)

## 2024-05-15 LAB — I-STAT CG4 LACTIC ACID, ED
Lactic Acid, Venous: 3 mmol/L (ref 0.5–1.9)
Lactic Acid, Venous: 2.7 mmol/L (ref 0.5–1.9)

## 2024-05-15 LAB — MAGNESIUM: Magnesium: 1.7 mg/dL (ref 1.7–2.4)

## 2024-05-15 LAB — D-DIMER, QUANTITATIVE: D-Dimer, Quant: 0.51 ug{FEU}/mL — ABNORMAL HIGH (ref 0.00–0.50)

## 2024-05-15 LAB — PROCALCITONIN: Procalcitonin: 0.14 ng/mL

## 2024-05-15 MED ORDER — POTASSIUM CHLORIDE 10 MEQ/100ML IV SOLN
10.0000 meq | INTRAVENOUS | Status: AC
  Administered 2024-05-15 (×2): 10 meq via INTRAVENOUS
  Filled 2024-05-15 (×2): qty 100

## 2024-05-15 MED ORDER — SODIUM CHLORIDE 0.9 % IV SOLN
2.0000 g | INTRAVENOUS | Status: DC
  Administered 2024-05-15: 2 g via INTRAVENOUS

## 2024-05-15 MED ORDER — VANCOMYCIN HCL 500 MG/100ML IV SOLN: 500.0000 mg | INTRAVENOUS | Status: DC

## 2024-05-15 MED ORDER — ACETAMINOPHEN 325 MG PO TABS: 650.0000 mg | ORAL_TABLET | Freq: Four times a day (QID) | ORAL | Status: DC | PRN

## 2024-05-15 MED ORDER — DONEPEZIL HCL 10 MG PO TABS
10.0000 mg | ORAL_TABLET | Freq: Every day | ORAL | Status: DC
Start: 2024-05-15 — End: 2024-05-16
  Administered 2024-05-15: 10 mg via ORAL

## 2024-05-15 MED ORDER — SODIUM CHLORIDE 0.9% FLUSH
3.0000 mL | Freq: Two times a day (BID) | INTRAVENOUS | Status: DC
  Administered 2024-05-15 (×2): 3 mL via INTRAVENOUS

## 2024-05-15 MED ORDER — LACTATED RINGERS IV BOLUS (SEPSIS)
1000.0000 mL | Freq: Once | INTRAVENOUS | Status: AC
  Administered 2024-05-15: 1000 mL via INTRAVENOUS

## 2024-05-15 MED ORDER — HYDROXYUREA 500 MG PO CAPS
500.0000 mg | ORAL_CAPSULE | ORAL | Status: DC
Start: 2024-05-16 — End: 2024-05-16
  Filled 2024-05-15: qty 1

## 2024-05-15 MED ORDER — ACETAMINOPHEN 500 MG PO TABS
1000.0000 mg | ORAL_TABLET | Freq: Once | ORAL | Status: AC
  Administered 2024-05-15: 1000 mg via ORAL
  Filled 2024-05-15: qty 2

## 2024-05-15 MED ORDER — LACTATED RINGERS IV SOLN: INTRAVENOUS | Status: AC

## 2024-05-15 MED ORDER — SODIUM CHLORIDE 0.9 % IV SOLN
2.0000 g | Freq: Once | INTRAVENOUS | Status: AC
  Administered 2024-05-15: 2 g via INTRAVENOUS
  Filled 2024-05-15: qty 12.5

## 2024-05-15 MED ORDER — SODIUM CHLORIDE 0.9% FLUSH: 3.0000 mL | INTRAVENOUS | Status: DC | PRN

## 2024-05-15 MED ORDER — SODIUM CHLORIDE 0.9% FLUSH
3.0000 mL | Freq: Two times a day (BID) | INTRAVENOUS | Status: DC
  Administered 2024-05-15: 10 mL via INTRAVENOUS
  Administered 2024-05-15: 5 mL via INTRAVENOUS

## 2024-05-15 MED ORDER — SODIUM CHLORIDE 0.9 % IV SOLN
2.0000 g | Freq: Two times a day (BID) | INTRAVENOUS | Status: DC
  Filled 2024-05-15: qty 12.5

## 2024-05-15 MED ORDER — ACETAMINOPHEN 650 MG RE SUPP: 650.0000 mg | Freq: Four times a day (QID) | RECTAL | Status: DC | PRN

## 2024-05-15 MED ORDER — AZITHROMYCIN 500 MG PO TABS: 250.0000 mg | ORAL_TABLET | Freq: Every day | ORAL | Status: DC

## 2024-05-15 MED ORDER — METRONIDAZOLE 500 MG/100ML IV SOLN
500.0000 mg | Freq: Once | INTRAVENOUS | Status: AC
  Administered 2024-05-15: 500 mg via INTRAVENOUS
  Filled 2024-05-15: qty 100

## 2024-05-15 MED ORDER — METRONIDAZOLE 500 MG/100ML IV SOLN
500.0000 mg | Freq: Two times a day (BID) | INTRAVENOUS | Status: DC
  Filled 2024-05-15: qty 100

## 2024-05-15 MED ORDER — LACTATED RINGERS IV BOLUS (SEPSIS)
500.0000 mL | Freq: Once | INTRAVENOUS | Status: AC
  Administered 2024-05-15: 500 mL via INTRAVENOUS

## 2024-05-15 MED ORDER — VANCOMYCIN HCL IN DEXTROSE 1-5 GM/200ML-% IV SOLN
1000.0000 mg | Freq: Once | INTRAVENOUS | Status: AC
  Administered 2024-05-15: 1000 mg via INTRAVENOUS
  Filled 2024-05-15: qty 200

## 2024-05-15 MED ORDER — AZITHROMYCIN 250 MG PO TABS
500.0000 mg | ORAL_TABLET | Freq: Every day | ORAL | Status: AC
  Administered 2024-05-15: 500 mg via ORAL
  Filled 2024-05-15: qty 2

## 2024-05-15 MED ORDER — HEPARIN SODIUM (PORCINE) 5000 UNIT/ML IJ SOLN
5000.0000 [IU] | Freq: Three times a day (TID) | INTRAMUSCULAR | Status: DC
  Administered 2024-05-15 – 2024-05-16 (×3): 5000 [IU] via SUBCUTANEOUS
  Filled 2024-05-15 (×2): qty 1

## 2024-05-15 MED ORDER — ASPIRIN 81 MG PO TBEC
81.0000 mg | DELAYED_RELEASE_TABLET | Freq: Every day | ORAL | Status: DC
  Administered 2024-05-15: 81 mg via ORAL
  Filled 2024-05-15: qty 1

## 2024-05-15 NOTE — Plan of Care (Signed)

## 2024-05-15 NOTE — ED Provider Notes (Signed)
 [facility]-EMERGENCY DEPT [facility] Emergency Department Provider Note MRN:  [id number]  Arrival date & time: 05/15/24     Chief Complaint   Dizziness   History of Present Illness   Janet Sherman is a 73 y.o. year-old female presents to the ED with chief complaint of right arm and leg pain and weakness.  She states that she has had the symptoms intermittently for several months.  She states that the symptoms worsened tonight and brought her to the emergency department.  She has noted to be febrile in triage to 100.8.  She is tachycardic to 136.  Lactic acid noted to be 3.0.  She states that she has had cough.  She denies any chest pain or shortness of breath.  She denies dysuria or hematuria.  She states that she sometimes has abdominal pain, but not now.  She denies any other new symptoms.  History provided by patient.   Review of Systems  Pertinent positive and negative review of systems noted in HPI.    Physical Exam   Vitals:   05/15/24 0215 05/15/24 0230  BP: 121/88 113/69  Pulse: (!) 107 98  Resp:    Temp:    SpO2: 97% 97%    CONSTITUTIONAL:  unwell-appearing, NAD NEURO:  Alert and oriented x 3, CN 3-12 grossly intact EYES:  eyes equal and reactive ENT/NECK:  Supple, no stridor  CARDIO:  tachycardic, regular rhythm, appears well-perfused  PULM:  No respiratory distress, CTAB GI/GU:  non-distended, no focal abdominal tenderness MSK/SPINE:  No gross deformities, no edema, moves all extremities  SKIN:  no rash, atraumatic   *Additional and/or pertinent findings included in MDM below  Diagnostic and Interventional Summary    EKG Interpretation Date/Time:    Ventricular Rate:    PR Interval:    QRS Duration:    QT Interval:    QTC Calculation:   R Axis:      Text Interpretation:         Labs Reviewed  COMPREHENSIVE METABOLIC PANEL WITH GFR - Abnormal; Notable for the following components:      Result Value   Potassium 3.3 (*)    Glucose, Bld 131 (*)     Creatinine, Ser 1.16 (*)    GFR, Estimated 50 (*)    All other components within normal limits  CBC WITH DIFFERENTIAL/PLATELET - Abnormal; Notable for the following components:   WBC 11.9 (*)    Neutro Abs 11.1 (*)    Lymphs Abs 0.2 (*)    Abs Immature Granulocytes 0.08 (*)    All other components within normal limits  PROTIME-INR - Abnormal; Notable for the following components:   Prothrombin Time 15.7 (*)    All other components within normal limits  URINALYSIS, W/ REFLEX TO CULTURE (INFECTION SUSPECTED) - Abnormal; Notable for the following components:   Hgb urine dipstick SMALL (*)    Leukocytes,Ua MODERATE (*)    Bacteria, UA RARE (*)    All other components within normal limits  I-STAT CG4 LACTIC ACID, ED - Abnormal; Notable for the following components:   Lactic Acid, Venous 3.0 (*)    All other components within normal limits  CULTURE, BLOOD (ROUTINE X 2)  CULTURE, BLOOD (ROUTINE X 2)  RESP PANEL BY RT-PCR (RSV, FLU A&B, COVID)  [id number]  I-STAT CG4 LACTIC ACID, ED    CT HEAD WO CONTRAST ( )  Final Result    DG Chest 2 View  Final Result      Medications  lactated  ringers  infusion (has no administration in time range)  lactated ringers  bolus 1,000 mL (1,000 mLs Intravenous New Bag/Given 05/15/24 0215)    And  lactated ringers  bolus 500 mL (500 mLs Intravenous New Bag/Given 05/15/24 0248)  metroNIDAZOLE  (FLAGYL ) IVPB 500 mg (500 mg Intravenous New Bag/Given 05/15/24 0246)  vancomycin  (VANCOCIN ) IVPB 1000 mg/200 mL premix (has no administration in time range)  ceFEPIme  (MAXIPIME ) 2 g in sodium chloride  0.9 % 100 mL IVPB (0 g Intravenous Stopped 05/15/24 0244)  acetaminophen  (TYLENOL ) tablet 1,000 mg (1,000 mg Oral Given 05/15/24 0209)     Procedures  /  Critical Care .Critical Care  Performed by: Sherel Dikes, PA-C Authorized by: Sherel Dikes, PA-C   Critical care provider statement:    Critical care time (minutes):  36   Critical care was necessary to treat  or prevent imminent or life-threatening deterioration of the following conditions:  Sepsis   Critical care was time spent personally by me on the following activities:  Development of treatment plan with patient or surrogate, discussions with consultants, evaluation of patient's response to treatment, examination of patient, ordering and review of laboratory studies, ordering and review of radiographic studies, ordering and performing treatments and interventions, pulse oximetry, re-evaluation of patient's condition and review of old charts   ED Course and Medical Decision Making  I have reviewed the triage vital signs, the nursing notes, and pertinent available records from the EMR.  Social Determinants Affecting Complexity of Care: Patient has no clinically significant social determinants affecting this chief complaint..   ED Course:    Medical Decision Making Patient here with cough, fever, some associated lightheadedness and dizziness that started tonight.  Noted to be febrile to 100.8 in triage.  She has increased respirations of 31.  Her lungs sound clear on my exam.  She states that she has been coughing.  Chest x-ray notable for bibasilar opacities worrisome for pneumonia in the setting.  She does have a mild leukocytosis.  Lactic acid is elevated at 3.0.  I did check a CT of her head which revealed acute on chronic sinusitis, but no evidence of stroke.  Patient started on broad-spectrum antibiotics and given fluids.  She was initially tachycardic to the 130s, but heart rate is trending down nicely with fluids.  Will admit to medicine.  Amount and/or Complexity of Data Reviewed Labs: ordered. Radiology: ordered.  Risk OTC drugs. Prescription drug management. Decision regarding hospitalization.         Consultants: I consulted with Hospitalist, Dr. Lydia Sams, who is appreciated for admitting.   Treatment and Plan: Patient's exam and diagnostic results are concerning for  pneumonia.  Feel that patient will need admission to the hospital for further treatment and evaluation.    Final Clinical Impressions(s) / ED Diagnoses     ICD-10-CM   1. SIRS (systemic inflammatory response syndrome) (HCC)  R65.10     2. Community acquired pneumonia, unspecified laterality  J18.9       ED Discharge Orders     None         Discharge Instructions Discussed with and Provided to Patient:   Discharge Instructions   None      Sherel Dikes, PA-C 05/15/24 0250    Mesner, Reymundo Caulk, MD 05/15/24 [phone]

## 2024-05-15 NOTE — H&P (Signed)
 History and Physical    Patient: Janet Sherman ZOX:[id number] DOB: 1951/08/22 DOA: 05/15/2024 DOS: the patient was seen and examined on 05/15/2024 PCP: Genia Kettering, MD  Patient coming from: Home Chief complaint: Chief Complaint  Patient presents with   Dizziness   HPI:  Janet Sherman is a 73 y.o. female with past medical history  of arthritis, gallstones, essential hypertension, migraine, history of pneumonia, allergy to mold and pollen coming for right arm and leg weakness and pain intermittently off and on for past week.  Chart review shows patient follows with hematology  for her essential thrombocytosis .   ED Course: Pt in ed at bedside  is noted to have fever of 100.8 heart rate of 136 lactic of 3.0 meeting sepsis criteria. Vital signs in the ED were notable for the following:  Vitals:   05/15/24 0230 05/15/24 0245 05/15/24 0300 05/15/24 0348  BP: 113/69 113/71 117/70 105/63  Pulse: 98 97 93 (!) 103  Temp:    98.4 F (36.9 C)  Resp:  18  20  Height:      Weight:      SpO2: 97% 99% 97% 96%  TempSrc:    Oral  BMI (Calculated):      >>ED evaluation thus far shows: Patient noted  to meet sepsis criteria, and given broad-spectrum IV antibiotics as below.  CMP today shows glucose 131 AKI of 1.6 EGFR 15 normal LFTs and with potassium of 3.3.,  CBC showing a white count of 11.9 normal hemoglobin normal platelets. Viral panel negative for flu RSV and COVID.  Head CT showing severe sinusitis. Chest x-ray shows COPD and bibasilar opacities left greater than right, concern for pneumonia.  >>While in the ED patient received the following: Medications  lactated ringers  infusion ( Intravenous New Bag/Given 05/15/24 0325)  vancomycin  (VANCOCIN ) IVPB 1000 mg/200 mL premix (has no administration in time range)  metroNIDAZOLE  (FLAGYL ) IVPB 500 mg (has no administration in time range)  potassium chloride  10 mEq in 100 mL IVPB (has no administration in time range)  heparin  injection  5,000 Units (has no administration in time range)  vancomycin  (VANCOREADY) IVPB 500 mg/100 mL (has no administration in time range)  ceFEPIme  (MAXIPIME ) 2 g in sodium chloride  0.9 % 100 mL IVPB (has no administration in time range)  sodium chloride  flush (NS) 0.9 % injection 3 mL (has no administration in time range)  acetaminophen  (TYLENOL ) tablet 650 mg (has no administration in time range)    Or  acetaminophen  (TYLENOL ) suppository 650 mg (has no administration in time range)  sodium chloride  flush (NS) 0.9 % injection 3-10 mL (has no administration in time range)  sodium chloride  flush (NS) 0.9 % injection 3-10 mL (has no administration in time range)  aspirin  EC tablet 81 mg (has no administration in time range)  lactated ringers  bolus 1,000 mL (0 mLs Intravenous Stopped 05/15/24 0324)    And  lactated ringers  bolus 500 mL (0 mLs Intravenous Stopped 05/15/24 0324)  ceFEPIme  (MAXIPIME ) 2 g in sodium chloride  0.9 % 100 mL IVPB (0 g Intravenous Stopped 05/15/24 0244)  metroNIDAZOLE  (FLAGYL ) IVPB 500 mg (0 mg Intravenous Stopped 05/15/24 0350)  acetaminophen  (TYLENOL ) tablet 1,000 mg (1,000 mg Oral Given 05/15/24 0209)     ROS Past Medical History:  Diagnosis Date   Arthritis    "right hand" (01/22/2017)   Gallstones    History of kidney stones    Hypertension    Migraine    "a few/year" (01/22/2017)  Pneumonia X 1   Past Surgical History:  Procedure Laterality Date   CRANIOTOMY N/A 10/16/2016   Procedure: Pituitary tumor (benign) resection - CRANIOTOMY HYPOPHYSECTOMY TRANSNASAL APPROACH;  Surgeon: Augusto Blonder, MD;  Location: [facility] OR;  Service: Neurosurgery;  Laterality: N/A;   ELECTROPHYSIOLOGIC STUDY N/A 01/22/2017   Procedure: SVT Ablation;  Surgeon: Will Cortland Ding, MD;  Location: [facility] INVASIVE CV LAB;  Service: Cardiovascular;  Laterality: N/A;   ELECTROPHYSIOLOGIC STUDY N/A 01/22/2017   Procedure: Electrophysiology Study;  Surgeon: Will Cortland Ding, MD;  Location: [facility]  INVASIVE CV LAB;  Service: Cardiovascular;  Laterality: N/A;   gallstones  ~ 2000   KIDNEY STONE SURGERY     "cut me open"   SUPRAVENTRICULAR TACHYCARDIA ABLATION  01/22/2017   TRANSNASAL APPROACH N/A 10/16/2016   Procedure: TRANSNASAL APPROACH WITH FUSION;  Surgeon: Ammon Bales, MD;  [facility];  Service: ENT;  Laterality: N/A;   TUBAL LIGATION      reports that she quit smoking about 7 years ago. Her smoking use included cigarettes. She has never used smokeless tobacco. She reports that she does not drink alcohol and does not use drugs. Allergies  Allergen Reactions   Mold Extract [Trichophyton] Shortness Of Breath and Other (See Comments)    Wheezing, itchy eyes, and runny nose, also   Pollen Extract Shortness Of Breath and Other (See Comments)    Wheezing, itchy eyes, and runny nose, also   Family History  Problem Relation Age of Onset   Hypertension Mother    Cancer Father        lung   Hypertension Sister    Hypertension Brother    Prior to Admission medications   Medication Sig Start Date End Date Taking? Authorizing Provider  aspirin  81 MG chewable tablet Chew 1 tablet (81 mg total) by mouth 2 (two) times daily. 11/14/21   Plotnikov, Aleksei V, MD  Cholecalciferol (VITAMIN D3) 50 MCG (2000 UT) capsule Take 1 capsule (2,000 Units total) by mouth daily. Patient not taking: Reported on 05/11/2023 11/10/22   Plotnikov, Oakley Bellman, MD  cyanocobalamin  (VITAMIN B12) 100 MCG tablet Take 100 mcg by mouth daily. Patient not taking: Reported on 05/11/2023    [provider]  donepezil  (ARICEPT ) 10 MG tablet Take 1 tablet (10 mg total) by mouth at bedtime. 09/14/23   Plotnikov, Oakley Bellman, MD  hydroxyurea  (HYDREA ) 500 MG capsule Take 1 capsule (500 mg total) by mouth as directed. May take with food to minimize GI side effects. Take 1 pill daily Monday Through Friday, do not take on Saturday/Sunday 01/06/24   Ander Bame, MD  meloxicam  (MOBIC ) 7.5 MG tablet Take 1 tab po  every day pc x 10 days, then prn 09/14/23   Plotnikov, Oakley Bellman, MD                                                                                 Vitals:   05/15/24 0230 05/15/24 0245 05/15/24 0300 05/15/24 0348  BP: 113/69 113/71 117/70 105/63  Pulse: 98 97 93 (!) 103  Resp:  18  20  Temp:    98.4 F (36.9 C)  TempSrc:    Oral  SpO2: 97% 99% 97% 96%  Weight:      Height:       Physical Exam Vitals and nursing note reviewed.  Constitutional:      General: She is not in acute distress. HENT:     Head: Normocephalic and atraumatic.     Right Ear: Hearing and external ear normal.     Left Ear: Hearing and external ear normal.     Nose: No nasal deformity.     Mouth/Throat:     Lips: Pink.  Eyes:     General: Lids are normal.     Extraocular Movements: Extraocular movements intact.  Cardiovascular:     Rate and Rhythm: Normal rate and regular rhythm.     Heart sounds: Normal heart sounds.  Pulmonary:     Effort: Pulmonary effort is normal.     Breath sounds: Wheezing present.  Abdominal:     General: Bowel sounds are normal. There is no distension.     Palpations: Abdomen is soft. There is no mass.     Tenderness: There is no abdominal tenderness.  Musculoskeletal:     Right lower leg: No edema.     Left lower leg: No edema.  Skin:    General: Skin is warm.  Neurological:     General: No focal deficit present.     Mental Status: She is alert and oriented to person, place, and time.     Cranial Nerves: Cranial nerves 2-12 are intact.  Psychiatric:        Mood and Affect: Mood normal.        Speech: Speech normal.        Behavior: Behavior normal.     Labs on Admission: I have personally reviewed following labs and imaging studies CBC: Recent Labs  Lab 05/15/24 0111  WBC 11.9*  NEUTROABS 11.1*  HGB 12.3  HCT 36.5  MCV 91.0  PLT 369   Basic Metabolic Panel: Recent Labs  Lab 05/15/24 0111  NA 137  K 3.3*  CL 102  CO2 22  GLUCOSE 131*  BUN 11   CREATININE 1.16*  CALCIUM  9.4   GFR: Estimated Creatinine Clearance: 33.9 mL/min (A) (by C-G formula based on SCr of 1.16 mg/dL (H)). Liver Function Tests: Recent Labs  Lab 05/15/24 0111  AST 22  ALT 10  ALKPHOS 42  BILITOT 0.8  PROT 6.9  ALBUMIN 3.6   No results for input(s): "LIPASE", "AMYLASE" in the last 168 hours. No results for input(s): "AMMONIA" in the last 168 hours. Coagulation Profile: Recent Labs  Lab 05/15/24 0111  INR 1.2   Cardiac Enzymes: No results for input(s): "CKTOTAL", "CKMB", "CKMBINDEX", "TROPONINI" in the last 168 hours. BNP (last 3 results) No results for input(s): "PROBNP" in the last 8760 hours. HbA1C: No results for input(s): "HGBA1C" in the last 72 hours. CBG: No results for input(s): "GLUCAP" in the last 168 hours. Lipid Profile: No results for input(s): "CHOL", "HDL", "LDLCALC", "TRIG", "CHOLHDL", "LDLDIRECT" in the last 72 hours. Thyroid  Function Tests: No results for input(s): "TSH", "T4TOTAL", "FREET4", "T3FREE", "THYROIDAB" in the last 72 hours. Anemia Panel: No results for input(s): "VITAMINB12", "FOLATE", "FERRITIN", "TIBC", "IRON", "RETICCTPCT" in the last 72 hours. Urine analysis:    Component Value Date/Time   COLORURINE YELLOW 05/15/2024 0138   APPEARANCEUR CLEAR 05/15/2024 0138   LABSPEC 1.008 05/15/2024 0138   PHURINE 7.0 05/15/2024 0138   GLUCOSEU NEGATIVE 05/15/2024 0138   GLUCOSEU NEGATIVE 09/14/2023 1446   HGBUR SMALL (A) 05/15/2024  0138   HGBUR trace-lysed 08/29/2009 1513   BILIRUBINUR NEGATIVE 05/15/2024 0138   KETONESUR NEGATIVE 05/15/2024 0138   PROTEINUR NEGATIVE 05/15/2024 0138   UROBILINOGEN 1.0 09/14/2023 1446   NITRITE NEGATIVE 05/15/2024 0138   LEUKOCYTESUR MODERATE (A) 05/15/2024 0138   Radiological Exams on Admission: CT HEAD WO CONTRAST ( ) Result Date: 05/15/2024 CLINICAL DATA:  worsening pain and weakness in right arm and right leg EXAM: CT HEAD WITHOUT CONTRAST TECHNIQUE: Contiguous axial  images were obtained from the base of the skull through the vertex without intravenous contrast. RADIATION DOSE REDUCTION: This exam was performed according to the departmental dose-optimization program which includes automated exposure control, adjustment of the mA and/or kV according to patient size and/or use of iterative reconstruction technique. COMPARISON:  CT head 07/31/2022. FINDINGS: Brain: No evidence of acute infarction, hemorrhage, hydrocephalus, extra-axial collection or mass lesion/mass effect. Vascular: No hyperdense vessel or unexpected calcification. Skull: No acute fracture. Sinuses/Orbits: Extensive paranasal sinus disease including complete opacification of the sphenoid sinus with destructive change of the anterior sphenoid walls and surrounding osteitis suggestive of chronic sinusitis. Additional mucosal thickening of the remaining sinuses. IMPRESSION: 1. No evidence of acute intracranial abnormality. 2. Findings suggestive of chronic severe sinusitis, detailed above. Correlate for signs/symptoms of acute sinusitis and consider ENT follow-up. Electronically Signed   By: Stevenson Elbe M.D.   On: 05/15/2024 02:11   DG Chest 2 View Result Date: 05/15/2024 CLINICAL DATA:  Suspected sepsis EXAM: CHEST - 2 VIEW COMPARISON:  01/01/2023 FINDINGS: There is hyperinflation of the lungs compatible with COPD. Heart and mediastinal contours within normal limits. Airspace opacities in both lower lobes could reflect atelectasis or pneumonia. No effusions. No acute bony abnormality. IMPRESSION: COPD. Bibasilar airspace opacities, left greater than right, atelectasis versus pneumonia. Electronically Signed   By: Janeece Mechanic M.D.   On: 05/15/2024 01:28   Data Reviewed: Relevant notes from primary care and specialist visits, past discharge summaries as available in EHR, including Care Everywhere. Prior diagnostic testing as pertinent to current admission diagnoses, Updated medications and problem lists  for reconciliation ED course, including vitals, labs, imaging, treatment and response to treatment,Triage notes, nursing and pharmacy notes and ED provider's notes Notable results as noted in HPI.Discussed case with EDMD/ ED APP/ or Specialty MD on call and as needed.  Assessment & Plan  >> Right sided numbness and generalized weakness: Daughter at bedside states that the right sided numbness is chronic but she feels like it has gotten worse along with generalized weakness.  Also reports decreased p.o. intake.  Patient at bedside states that she is feeling better since coming to the hospital.we will get ct of right shoulder suspect underlying arthritis.  >> AKI: Lab Results  Component Value Date   CREATININE 1.16 (H) 05/15/2024   CREATININE 1.07 (H) 03/03/2024   CREATININE 0.92 02/03/2024  Mild cont mivf .    >>Abnormal EKG : Pt had sinus tach at 140's st depression and troponin ordered.  We will repeat and contact cardiology for consult if it is worse.   >> Alzheimer's dementia with h/o behavioral disturbance: Patient is on aricept  and Prozac . And will continue the same.   >> Essential hypertension: Vitals:   05/15/24 0100 05/15/24 0130 05/15/24 0145 05/15/24 0215  BP: 125/76 122/83 129/81 121/88   05/15/24 0230 05/15/24 0245 05/15/24 0300 05/15/24 0348  BP: 113/69 113/71 117/70 105/63  Currently will hold any bp meds or diuretics.   >> Pneumonia: Chest x-ray done today consistent with basilar airspace  disease, concerning for pneumonia will obtain a procalcitonin and continue patient on her broad-spectrum antibiotics.  Regardless the procalcitonin is negative we will still continue antibiotics for gram-positive gram-negative coverage for sinusitis.   >>Sepsis/ PNA/ Sinusitis: Pt meets sepsis criteria and we will cont iv abx and MIVF.    >>Hypokalemia: We will replace and follow mag level.     DVT prophylaxis:  Heparin .  Consults:  None Advance Care Planning:     Code Status: Full Code   Family Communication:  Daughter Disposition Plan:  Home Severity of Illness: The appropriate patient status for this patient is INPATIENT. Inpatient status is judged to be reasonable and necessary in order to provide the required intensity of service to ensure the patient's safety. The patient's presenting symptoms, physical exam findings, and initial radiographic and laboratory data in the context of their chronic comorbidities is felt to place them at high risk for further clinical deterioration. Furthermore, it is not anticipated that the patient will be medically stable for discharge from the hospital within 2 midnights of admission.   * I certify that at the point of admission it is my clinical judgment that the patient will require inpatient hospital care spanning beyond 2 midnights from the point of admission due to high intensity of service, high risk for further deterioration and high frequency of surveillance required.*  Unresulted Labs (From admission, onward)     Start     Ordered   05/15/24 0500  Comprehensive metabolic panel  Tomorrow morning,   R        05/15/24 0414   05/15/24 0500  CBC  Tomorrow morning,   R        05/15/24 0414   05/15/24 0415  Creatinine, serum  (heparin )  Once,   R       Comments: Baseline for heparin  therapy IF NOT ALREADY DRAWN.    05/15/24 0414   05/15/24 0356  Procalcitonin  Add-on,   AD       References:    Procalcitonin Lower Respiratory Tract Infection AND Sepsis Procalcitonin Algorithm   05/15/24 0355   05/15/24 0345  Magnesium  Add-on,   AD        05/15/24 0344   05/15/24 0104  Culture, blood (Routine x 2)  BLOOD CULTURE X 2,   R      05/15/24 0103            Orders Placed This Encounter  Procedures   Critical Care   Culture, blood (Routine x 2)   Resp panel by RT-PCR (RSV, Flu A&B, Covid) Anterior Nasal Swab   DG Chest 2 View   CT HEAD WO CONTRAST ( )   CT SHOULDER RIGHT WO CONTRAST   Comprehensive  metabolic panel   CBC with Differential   Protime-INR   Urinalysis, w/ Reflex to Culture (Infection Suspected) -Urine, Clean Catch   Magnesium   Procalcitonin   Comprehensive metabolic panel   CBC   Creatinine, serum   DIET SOFT Room service appropriate? Yes; Fluid consistency: Thin   Notify physician (specify)  Specify: Notify provider for possible Code Sepsis   Document height and weight   Assess and Document Glasgow Coma Scale   Document vital signs within 1-hour of fluid bolus completion. Notify provider of abnormal vital signs despite fluid resuscitation.   DO NOT delay antibiotics if unable to obtain blood culture.   Refer to Sidebar Report: Sepsis Sidebar ED/IP   Notify provider for difficulties obtaining IV access.  Insert peripheral IV x 2   Initiate Carrier Fluid Protocol   Maintain IV access   Vital signs   Notify physician (specify)   Mobility Protocol: No Restrictions RN to initiate protocols based on patient's level of care   Refer to Sidebar Report Refer to ICU, Med-Surg, Progressive, and Step-Down Mobility Protocol Sidebars   Initiate Adult Central Line Maintenance and Catheter Protocol for patients with central line (CVC, PICC, Port, Hemodialysis, Trialysis)   Daily weights   Intake and Output   Do not place and if present remove PureWick   Initiate Oral Care Protocol   Initiate Carrier Fluid Protocol   RN may order General Admission PRN Orders utilizing "General Admission PRN medications" (through manage orders) for the following patient needs: allergy symptoms (Claritin ), cold sores (Carmex), cough (Robitussin DM), eye irritation (Liquifilm Tears), hemorrhoids (Tucks), indigestion (Maalox), minor skin irritation (Hydrocortisone  Cream), muscle pain (Ben Gay), nose irritation (saline nasal spray) and sore throat (Chloraseptic spray).   Swallow screen   Cardiac Monitoring - Continuous Indefinite   Cardiac Monitoring - Continuous Indefinite   Full code   monitoring  by pharmacy   Consult to hospitalist   vancomycin  per pharmacy consult   Pulse oximetry check with vital signs   Oxygen therapy Mode or (Route): Nasal cannula; Liters Per Minute: 2; Keep O2 saturation between: greater than 92 %   I-Stat Lactic Acid, ED   EKG 12-Lead   ED EKG   EKG 12-Lead   EKG 12-Lead   Place in observation (patient's expected length of stay will be less than 2 midnights)   Aspiration precautions   Fall precautions    Author: Lavanda Porter, MD 12 pm -8 pm. 05/15/2024 4:14 AM >>Please note for any concern,or critical results after hours past 8pm please contact the [facility] hospitalist [facility] floor coverage provider from 7 PM- 7 AM. For on call review [email or web address], username [email or web address] and PW: your phone number<<

## 2024-05-15 NOTE — Care Plan (Addendum)
 Admitted earlier this morning by my colleague.  I have reviewed the chart, seen and examined the patient, and agree with assessment and plan unless stated otherwise.  Hx dementia presenting with lethargy, found to be febrile to 100.8 with elevated lactate. In terms of symptoms has chronic right shoulder pain (nothing acute on CT here), daughter notes chronic cough that is worse past few days. Also complains of right leg weakness. CXR shows bibasilar infiltrates. Blood cultures are cooking. Urinalysis not strongly suggestive of infection and patient denies symptoms. Covid/flu/rsv neg. At this time most likely etiology for patient's fever, cough, and lethargy is a respiratory infection, possible pneumonia. Will check respiratory panel and add on urine antigens, will also add on urine culture. Will continue fluids. Will stop vancomycin /cefepime /flagyl  and start ceftriaxone /azithromycin . Will f/u dimer. Will order PT consult. Given complaint of right leg weakness will check mri brain. Possible d/c tomorrow.

## 2024-05-15 NOTE — ED Notes (Signed)
 Hooked patient back to the monitor patient is resting with call bell in reach and family at bedside

## 2024-05-15 NOTE — Progress Notes (Signed)
 Pharmacy Antibiotic Note  Janet Sherman is a 73 y.o. female admitted on 05/15/2024 with fever/tachycardia, possible sepsis.  Pharmacy has been consulted for Vancomycin  and Cefepime   dosing.  Plan: Vancomycin  500 mg IV q24h Cefepime  2 g IV q12h  Height: 5\' 5"  (165.1 cm) Weight: 49 kg (108 lb 0.4 oz) IBW/kg (Calculated) : 57  Temp (24hrs), Avg:99.6 F (37.6 C), Min:98.4 F (36.9 C), Max:100.8 F (38.2 C)  Recent Labs  Lab 05/15/24 0111 05/15/24 0118  WBC 11.9*  --   CREATININE 1.16*  --   LATICACIDVEN  --  3.0*    Estimated Creatinine Clearance: 33.9 mL/min (A) (by C-G formula based on SCr of 1.16 mg/dL (H)).    Allergies  Allergen Reactions   Mold Extract [Trichophyton] Shortness Of Breath and Other (See Comments)    Wheezing, itchy eyes, and runny nose, also   Pollen Extract Shortness Of Breath and Other (See Comments)    Wheezing, itchy eyes, and runny nose, also     Carlota Chestnut 05/15/2024 4:10 AM

## 2024-05-15 NOTE — ED Notes (Signed)
 Called CCMD.

## 2024-05-15 NOTE — ED Notes (Signed)
 Walked patient to the bathroom patient did well patient is now back in bed on monitor sitting up eating with call bell in reach

## 2024-05-15 NOTE — ED Triage Notes (Signed)
 Pt c/o headache, dizziness, and lightheadedness that started tonight. Pt also endorses chronic right arm pain that has worsened over the last week.

## 2024-05-16 ENCOUNTER — Other Ambulatory Visit (HOSPITAL_COMMUNITY): Payer: Self-pay

## 2024-05-16 DIAGNOSIS — R200 Anesthesia of skin: Secondary | ICD-10-CM | POA: Diagnosis not present

## 2024-05-16 LAB — BASIC METABOLIC PANEL WITH GFR
Anion gap: 9 (ref 5–15)
BUN: 7 mg/dL — ABNORMAL LOW (ref 8–23)
CO2: 25 mmol/L (ref 22–32)
Calcium: 8.7 mg/dL — ABNORMAL LOW (ref 8.9–10.3)
Chloride: 105 mmol/L (ref 98–111)
Creatinine, Ser: 1.14 mg/dL — ABNORMAL HIGH (ref 0.44–1.00)
GFR, Estimated: 51 mL/min — ABNORMAL LOW (ref >60)
Glucose, Bld: 87 mg/dL (ref 70–99)
Potassium: 3.7 mmol/L (ref 3.5–5.1)
Sodium: 139 mmol/L (ref 135–145)

## 2024-05-16 LAB — STREP PNEUMONIAE URINARY ANTIGEN: Strep Pneumo Urinary Antigen: NEGATIVE

## 2024-05-16 LAB — LACTIC ACID, PLASMA: Lactic Acid, Venous: 0.7 mmol/L (ref 0.5–1.9)

## 2024-05-16 MED ORDER — AZITHROMYCIN 500 MG PO TABS
250.0000 mg | ORAL_TABLET | Freq: Every day | ORAL | Status: DC
  Administered 2024-05-16: 250 mg via ORAL

## 2024-05-16 MED ORDER — AZITHROMYCIN 250 MG PO TABS
250.0000 mg | ORAL_TABLET | Freq: Every day | ORAL | 0 refills | Status: AC
  Filled 2024-05-16: qty 3, 3d supply, fill #0

## 2024-05-16 MED ORDER — AMOXICILLIN-POT CLAVULANATE 875-125 MG PO TABS
1.0000 | ORAL_TABLET | Freq: Two times a day (BID) | ORAL | 0 refills | Status: AC
  Filled 2024-05-16: qty 12, 6d supply, fill #0

## 2024-05-16 NOTE — Care Management Obs Status (Signed)
 MEDICARE OBSERVATION STATUS NOTIFICATION   Patient Details  Name: Janet Sherman MRN: [id number] Date of Birth: 1951/04/30   Medicare Observation Status Notification Given:  Yes Moon/Obs letter signed and copy given   Wynonia Hedges 05/16/2024, 9:30 AM

## 2024-05-16 NOTE — Discharge Summary (Signed)
 Janet Sherman ZOX:[id number] DOB: 04-19-1951 DOA: 05/15/2024  PCP: Genia Kettering, MD  Admit date: 05/15/2024 Discharge date: 05/16/2024  Time spent: 35 minutes  Recommendations for Outpatient Follow-up:  Pcp f/u 1 week     Discharge Diagnoses:  Principal Problem:   Right sided numbness Active Problems:   HTN (hypertension)   Alzheimer's dementia with behavioral disturbance [facility])   Discharge Condition: stable  Diet recommendation: heart healthy  Filed Weights   05/15/24 0101  Weight: 49 kg    History of present illness:  From admission h and p by dr. Lydia Sams: Janet Sherman is a 73 y.o. female with past medical history  of arthritis, gallstones, essential hypertension, migraine, history of pneumonia, allergy to mold and pollen coming for right arm and leg weakness and pain intermittently off and on for past week.  Chart review shows patient follows with hematology  for her essential thrombocytosis .       Hospital Course:  Hx dementia presenting with lethargy, found to be febrile to 100.8 with elevated lactate. In terms of symptoms has chronic right shoulder pain (nothing acute on CT here), daughter notes chronic cough that is worse past few days. Also complains of right leg weakness. CXR shows bibasilar infiltrates. Blood cultures ngtd. Urinalysis not strongly suggestive of infection and patient denies symptoms. Covid/flu/rsv and respiratory viral panel neg. CT shows chronic sinusitis, patient not complaining of sinus pain. Most likely etiology for patient's fever, cough, and lethargy is a respiratory infection, possible pneumonia. Ambulated well with PT, no home health needs. Given initial complaint of right leg weakness mri of brain was obtained, nothing acute. Symptomatically much improved by hospital day 1, no cough or dyspnea, fever resolved, tolerating po, ambulating well. Will discharge to complete course of abx (augmentin /azithromycin ) for CAP.  Procedures: none    Consultations: none  Discharge Exam: Vitals:   05/16/24 0515 05/16/24 0724  BP: 104/69 111/69  Pulse: 85 85  Resp: 18 17  Temp: 98.9 F (37.2 C) 98.3 F (36.8 C)  SpO2: 90% 97%    General: NAD Cardiovascular: RRR Respiratory: rales at bases, otherwise clear  Discharge Instructions   Discharge Instructions     Diet - low sodium heart healthy   Complete by: As directed    Increase activity slowly   Complete by: As directed       Allergies as of 05/16/2024       Reactions   Mold Extract [trichophyton] Shortness Of Breath, Other (See Comments)   Wheezing, itchy eyes, and runny nose, also   Pollen Extract Shortness Of Breath, Other (See Comments)   Wheezing, itchy eyes, and runny nose, also        Medication List     TAKE these medications    amoxicillin -clavulanate 875-125 MG tablet Commonly known as: AUGMENTIN  Take 1 tablet by mouth 2 (two) times daily for 6 days.   aspirin  EC 81 MG tablet Take 81 mg by mouth daily. Swallow whole.   azithromycin  250 MG tablet Commonly known as: ZITHROMAX  Take 1 tablet (250 mg total) by mouth daily for 3 days. Starting 5/21   donepezil  10 MG tablet Commonly known as: ARICEPT  Take 1 tablet (10 mg total) by mouth at bedtime.   hydroxyurea  500 MG capsule Commonly known as: HYDREA  Take 1 capsule (500 mg total) by mouth as directed. May take with food to minimize GI side effects. Take 1 pill daily Monday Through Friday, do not take on Saturday/Sunday       Allergies  Allergen Reactions   Mold Extract [Trichophyton] Shortness Of Breath and Other (See Comments)    Wheezing, itchy eyes, and runny nose, also   Pollen Extract Shortness Of Breath and Other (See Comments)    Wheezing, itchy eyes, and runny nose, also    Follow-up Information     Plotnikov, Oakley Bellman, MD Follow up.   Specialty: Internal Medicine Why: in about 1 week Contact information: [address] [place] [address] [phone]                   The results of significant diagnostics from this hospitalization (including imaging, microbiology, ancillary and laboratory) are listed below for reference.    Significant Diagnostic Studies: MR BRAIN WO CONTRAST Result Date: 05/15/2024 CLINICAL DATA:  Provided history: Weakness. EXAM: MRI HEAD WITHOUT CONTRAST TECHNIQUE: Multiplanar, multiecho pulse sequences of the brain and surrounding structures were obtained without intravenous contrast. COMPARISON:  Head CT 05/15/2024.  Brain MRI 08/14/2016. FINDINGS: Brain: Mild generalized cerebral atrophy. Unchanged small T2 hyperintense foci within the left thalamus, which may reflect prominent perivascular spaces and/or chronic lacunar infarcts. No cortical encephalomalacia is identified. No significant cerebral white matter disease for age. There is no acute infarct. No evidence of an intracranial mass. Specifically, there is no evidence of a residual/recurrent intrasellar mass within limitations of a non-contrast brain MRI. No extra-axial fluid collection. No midline shift. Vascular: Maintained flow voids within the proximal large arterial vessels. Skull and upper cervical spine: No focal worrisome marrow lesion. Incompletely assessed cervical spondylosis. Sinuses/Orbits: No mass or acute finding within the imaged orbits. Postsurgical appearance of the paranasal sinuses. Persistent pansinusitis. Most notably, there is near complete opacification of the right sphenoid sinus, complete opacification of the left sphenoid sinus and near complete opacification of the left ethmoid air cells. IMPRESSION: 1.  No acute intracranial finding. 2. Prominent perivascular spaces and/or chronic lacunar infarcts within the left thalamus, unchanged from the prior MRI of 08/14/2016. 3. Mild generalized cerebral atrophy. 4. Pansinusitis (most notably severe bilateral sphenoid and left ethmoid sinusitis). Correlate for signs/symptoms of acute sinusitis and consider  ENT referral. Electronically Signed   By: Bascom Lily D.O.   On: 05/15/2024 12:58   CT SHOULDER RIGHT WO CONTRAST Result Date: 05/15/2024 CLINICAL DATA:  Upper extremity pain. EXAM: CT OF THE UPPER RIGHT EXTREMITY WITHOUT CONTRAST TECHNIQUE: Multidetector CT imaging of the upper right extremity was performed according to the standard protocol. RADIATION DOSE REDUCTION: This exam was performed according to the departmental dose-optimization program which includes automated exposure control, adjustment of the mA and/or kV according to patient size and/or use of iterative reconstruction technique. COMPARISON:  None Available. FINDINGS: Bones/Joint/Cartilage No signs of acute fracture or dislocation. Mild degenerative changes at the acromioclavicular joint. Glenohumeral joint space narrowing with marginal spur formation Ligaments Suboptimally assessed by CT. Muscles and Tendons No mass or hematoma identified.  No signs of inflammation. Soft tissues Unremarkable. Signs of emphysema with diffuse bronchial wall thickening noted within the imaged portions of the right lung. IMPRESSION: 1. No acute findings. 2. Mild degenerative changes at the acromioclavicular joint and glenohumeral joint. 3. Signs of emphysema with diffuse bronchial wall thickening noted within the imaged portions of the right lung. 4.  Emphysema (ICD10-J43.9). Electronically Signed   By: Kimberley Penman M.D.   On: 05/15/2024 05:44   CT HEAD WO CONTRAST ( ) Result Date: 05/15/2024 CLINICAL DATA:  worsening pain and weakness in right arm and right leg EXAM: CT HEAD WITHOUT CONTRAST TECHNIQUE: Contiguous  axial images were obtained from the base of the skull through the vertex without intravenous contrast. RADIATION DOSE REDUCTION: This exam was performed according to the departmental dose-optimization program which includes automated exposure control, adjustment of the mA and/or kV according to patient size and/or use of iterative reconstruction  technique. COMPARISON:  CT head 07/31/2022. FINDINGS: Brain: No evidence of acute infarction, hemorrhage, hydrocephalus, extra-axial collection or mass lesion/mass effect. Vascular: No hyperdense vessel or unexpected calcification. Skull: No acute fracture. Sinuses/Orbits: Extensive paranasal sinus disease including complete opacification of the sphenoid sinus with destructive change of the anterior sphenoid walls and surrounding osteitis suggestive of chronic sinusitis. Additional mucosal thickening of the remaining sinuses. IMPRESSION: 1. No evidence of acute intracranial abnormality. 2. Findings suggestive of chronic severe sinusitis, detailed above. Correlate for signs/symptoms of acute sinusitis and consider ENT follow-up. Electronically Signed   By: Stevenson Elbe M.D.   On: 05/15/2024 02:11   DG Chest 2 View Result Date: 05/15/2024 CLINICAL DATA:  Suspected sepsis EXAM: CHEST - 2 VIEW COMPARISON:  01/01/2023 FINDINGS: There is hyperinflation of the lungs compatible with COPD. Heart and mediastinal contours within normal limits. Airspace opacities in both lower lobes could reflect atelectasis or pneumonia. No effusions. No acute bony abnormality. IMPRESSION: COPD. Bibasilar airspace opacities, left greater than right, atelectasis versus pneumonia. Electronically Signed   By: Janeece Mechanic M.D.   On: 05/15/2024 01:28    Microbiology: Recent Results (from the past 240 hours)  Culture, blood (Routine x 2)     Status: None (Preliminary result)   Collection Time: 05/15/24  1:10 AM   Specimen: BLOOD  Result Value Ref Range Status   Specimen Description BLOOD LEFT FOREARM  Final   Special Requests   Final    BOTTLES DRAWN AEROBIC AND ANAEROBIC Blood Culture adequate volume   Culture   Final    NO GROWTH 1 DAY Performed at [facility], [address] [place], [place] [address]    Report Status PENDING  Incomplete  Culture, blood (Routine x 2)     Status: None (Preliminary result)    Collection Time: 05/15/24  1:13 AM   Specimen: BLOOD  Result Value Ref Range Status   Specimen Description BLOOD RIGHT ARM  Final   Special Requests   Final    BOTTLES DRAWN AEROBIC AND ANAEROBIC Blood Culture adequate volume   Culture   Final    NO GROWTH 1 DAY Performed at [facility], [address], [place], [place] [address]    Report Status PENDING  Incomplete  Resp panel by RT-PCR (RSV, Flu A&B, Covid) Anterior Nasal Swab     Status: None   Collection Time: 05/15/24  2:19 AM   Specimen: Anterior Nasal Swab  Result Value Ref Range Status   SARS Coronavirus 2 by RT PCR NEGATIVE NEGATIVE Final   Influenza A by PCR NEGATIVE NEGATIVE Final   Influenza B by PCR NEGATIVE NEGATIVE Final    Comment: (NOTE) The Xpert Xpress SARS-CoV-2/FLU/RSV plus assay is intended as an aid in the diagnosis of influenza from Nasopharyngeal swab specimens and should not be used as a sole basis for treatment. Nasal washings and aspirates are unacceptable for Xpert Xpress SARS-CoV-2/FLU/RSV testing.  Fact Sheet for Patients: BloggerCourse.com  Fact Sheet for Healthcare Providers: SeriousBroker.it  This test is not yet approved or cleared by the [place]  FDA and has been authorized for detection and/or diagnosis of SARS-CoV-2 by FDA under an Emergency Use Authorization (EUA). This EUA  will remain in effect (meaning this test can be used) for the duration of the COVID-19 declaration under Section 564(b)(1) of the Act, 21 U.S.C. section 360bbb-3(b)(1), unless the authorization is terminated or revoked.     Resp Syncytial Virus by PCR NEGATIVE NEGATIVE Final    Comment: (NOTE) Fact Sheet for Patients: BloggerCourse.com  Fact Sheet for Healthcare Providers: SeriousBroker.it  This test is not yet approved or cleared by the [place]  FDA and has been authorized for detection and/or  diagnosis of SARS-CoV-2 by FDA under an Emergency Use Authorization (EUA). This EUA will remain in effect (meaning this test can be used) for the duration of the COVID-19 declaration under Section 564(b)(1) of the Act, 21 U.S.C. section 360bbb-3(b)(1), unless the authorization is terminated or revoked.  Performed at [facility], [address] [place], [place] [address]   Respiratory (~20 pathogens) panel by PCR     Status: None   Collection Time: 05/15/24  9:53 AM   Specimen: Nasopharyngeal Swab; Respiratory  Result Value Ref Range Status   Adenovirus NOT DETECTED NOT DETECTED Final   Coronavirus 229E NOT DETECTED NOT DETECTED Final    Comment: (NOTE) The Coronavirus on the Respiratory Panel, DOES NOT test for the novel  Coronavirus (2019 nCoV)    Coronavirus HKU1 NOT DETECTED NOT DETECTED Final   Coronavirus NL63 NOT DETECTED NOT DETECTED Final   Coronavirus OC43 NOT DETECTED NOT DETECTED Final   Metapneumovirus NOT DETECTED NOT DETECTED Final   Rhinovirus / Enterovirus NOT DETECTED NOT DETECTED Final   Influenza A NOT DETECTED NOT DETECTED Final   Influenza B NOT DETECTED NOT DETECTED Final   Parainfluenza Virus 1 NOT DETECTED NOT DETECTED Final   Parainfluenza Virus 2 NOT DETECTED NOT DETECTED Final   Parainfluenza Virus 3 NOT DETECTED NOT DETECTED Final   Parainfluenza Virus 4 NOT DETECTED NOT DETECTED Final   Respiratory Syncytial Virus NOT DETECTED NOT DETECTED Final   Bordetella pertussis NOT DETECTED NOT DETECTED Final   Bordetella Parapertussis NOT DETECTED NOT DETECTED Final   Chlamydophila pneumoniae NOT DETECTED NOT DETECTED Final   Mycoplasma pneumoniae NOT DETECTED NOT DETECTED Final    Comment: Performed at [facility], [address] [place], [place] [address]     Labs: Basic Metabolic Panel: Recent Labs  Lab 05/15/24 0111 05/15/24 0525 05/16/24 0702  NA 137 137 139  K 3.3* 3.3* 3.7  CL 102 106 105  CO2 22 22 25   GLUCOSE 131* 143* 87   BUN 11 10 7*  CREATININE 1.16* 1.06* 1.14*  CALCIUM  9.4 8.5* 8.7*  MG 1.7  --   --    Liver Function Tests: Recent Labs  Lab 05/15/24 0111 05/15/24 0525  AST 22 17  ALT 10 8  ALKPHOS 42 35*  BILITOT 0.8 1.0  PROT 6.9 5.1*  ALBUMIN 3.6 2.6*   No results for input(s): "LIPASE", "AMYLASE" in the last 168 hours. No results for input(s): "AMMONIA" in the last 168 hours. CBC: Recent Labs  Lab 05/15/24 0111 05/15/24 0525  WBC 11.9* 11.6*  NEUTROABS 11.1*  --   HGB 12.3 9.8*  HCT 36.5 29.9*  MCV 91.0 92.6  PLT 369 291   Cardiac Enzymes: No results for input(s): "CKTOTAL", "CKMB", "CKMBINDEX", "TROPONINI" in the last 168 hours. BNP: BNP (last 3 results) No results for input(s): "BNP" in the last 8760 hours.  ProBNP (last 3 results) No results for input(s): "PROBNP" in the last 8760 hours.  CBG: No results for  input(s): "GLUCAP" in the last 168 hours.     Signed:  Raymonde Calico MD.  [facility] 05/16/2024, 8:41 AM

## 2024-05-16 NOTE — Evaluation (Signed)
 Physical Therapy Brief Evaluation and Discharge Note Patient Details Name: Janet Sherman MRN: [id number] DOB: 09-22-1951 Today's Date: 05/16/2024   History of Present Illness  73 yo female admitted 05/15/24 with right leg weakness with MRI (-). PMHx: Alzheimer's dementia, HLD, SVT, HTN  Clinical Impression  Pt very pleasant, lives at home with daughter who works during the day. Pt able to perform all transfers and gait with supervision for safety. Pt reports chronic Rt shoulder pain but no current RLE deficits and able to move at baseline function. Pt without further therapy needs at this time, confirmed with daughter present in room and will sign off.        PT Assessment Patient does not need any further PT services  Assistance Needed at Discharge  PRN    Equipment Recommendations None recommended by PT  Recommendations for Other Services       Precautions/Restrictions Precautions Precautions: None        Mobility  Bed Mobility Rolling: Modified independent (Device/Increase time)        Transfers Overall transfer level: Independent                      Ambulation/Gait Ambulation/Gait assistance: Supervision Gait Distance (Feet): 400 Feet Assistive device: None Gait Pattern/deviations: WFL(Within Functional Limits) Gait Speed: Pace WFL General Gait Details: supervision for safety and direction as pt unable to recall room number and wayfind  Home Activity Instructions    Stairs            Modified Rankin (Stroke Patients Only)        Balance Overall balance assessment: No apparent balance deficits (not formally assessed)                        Pertinent Vitals/Pain   Pain Assessment Pain Assessment: No/denies pain     Home Living Family/patient expects to be discharged to:: Private residence Living Arrangements: Children Available Help at Discharge: Family;Available PRN/intermittently Home Environment: Level entry   Home  Equipment: None        Prior Function Level of Independence: Independent      UE/LE Assessment   UE ROM/Strength/Tone/Coordination: WFL    LE ROM/Strength/Tone/Coordination: [facility]      Communication   Communication Communication: No apparent difficulties     Cognition Overall Cognitive Status: History of cognitive impairments - at baseline       General Comments      Exercises     Assessment/Plan    PT Problem List         PT Visit Diagnosis Other abnormalities of gait and mobility (R26.89)    No Skilled PT Patient at baseline level of functioning;Patient is supervision for all activity/mobility;Patient will have necessary level of assist by caregiver at discharge   Co-evaluation                AMPAC 6 Clicks Help needed turning from your back to your side while in a flat bed without using bedrails?: None Help needed moving from lying on your back to sitting on the side of a flat bed without using bedrails?: None Help needed moving to and from a bed to a chair (including a wheelchair)?: None Help needed standing up from a chair using your arms (e.g., wheelchair or bedside chair)?: None Help needed to walk in hospital room?: A Little Help needed climbing 3-5 steps with a railing? : A Little 6 Click Score: 22  End of Session   Activity Tolerance: Patient tolerated treatment well Patient left: in chair;with call bell/phone within reach;with family/visitor present Nurse Communication: Mobility status PT Visit Diagnosis: Other abnormalities of gait and mobility (R26.89)     Time: 4401-0272 PT Time Calculation (min) (ACUTE ONLY): 12 min  Charges:   PT Evaluation $PT Eval Low Complexity: 1 Low      Niccolo Burggraf P, PT Acute Rehabilitation Services Office: [phone]   Luwana Salvo  05/16/2024, 8:47 AM

## 2024-05-17 ENCOUNTER — Telehealth: Payer: Self-pay

## 2024-05-17 LAB — URINE CULTURE: Culture: NO GROWTH

## 2024-05-17 LAB — LEGIONELLA PNEUMOPHILA SEROGP 1 UR AG: L. pneumophila Serogp 1 Ur Ag: NEGATIVE

## 2024-05-17 NOTE — Transitions of Care (Post Inpatient/ED Visit) (Signed)
   05/17/2024  Name: Janet Sherman MRN: [id number] DOB: 06-Oct-1951  Today's TOC FU Call Status: Today's TOC FU Call Status:: Successful TOC FU Call Completed TOC FU Call Complete Date: 05/17/24 Patient's Name and Date of Birth confirmed.  Transition Care Management Follow-up Telephone Call Date of Discharge: 05/16/24 Discharge Facility[facility]) Type of Discharge: Inpatient Admission Primary Inpatient Discharge Diagnosis:: SIRS How have you been since you were released from the hospital?: Better Any questions or concerns?: No  Items Reviewed: Did you receive and understand the discharge instructions provided?: Yes Medications obtained,verified, and reconciled?: Yes (Medications Reviewed) Any new allergies since your discharge?: No Dietary orders reviewed?: Yes Do you have support at home?: Yes People in Home [RPT]: child(ren), adult  Medications Reviewed Today: Medications Reviewed Today     Reviewed by Darrall Ellison, LPN (Licensed Practical Nurse) on 05/17/24 at 1147  Med List Status: <None>   Medication Order Taking? Sig Documenting Provider Last Dose Status Informant  amoxicillin -clavulanate (AUGMENTIN ) 875-125 MG tablet [id number]  Take 1 tablet by mouth 2 (two) times daily for 6 days. [name], MD  Active   aspirin  EC 81 MG tablet [id number] No Take 81 mg by mouth daily. Swallow whole. [provider] Past Week Active Child, Pharmacy Records  azithromycin  (ZITHROMAX ) 250 MG tablet [id number]  Take 1 tablet (250 mg total) by mouth daily for 3 days. Starting 5/21 [name], MD  Active   donepezil  (ARICEPT ) 10 MG tablet [id number] No Take 1 tablet (10 mg total) by mouth at bedtime. Plotnikov, Aleksei V, MD 05/14/2024 Bedtime Active Child, Pharmacy Records  hydroxyurea  (HYDREA ) 500 MG capsule [id number] No Take 1 capsule (500 mg total) by mouth as directed. May take with food to minimize GI side effects. Take 1 pill daily Monday Through Friday, do not  take on Saturday/Sunday Dorsey, John T IV, MD Past Week Active Child, Pharmacy Records            Home Care and Equipment/Supplies: Were Home Health Services Ordered?: NA Any new equipment or medical supplies ordered?: NA  Functional Questionnaire: Do you need assistance with bathing/showering or dressing?: No Do you need assistance with meal preparation?: No Do you need assistance with eating?: No Do you have difficulty maintaining continence: No Do you need assistance with getting out of bed/getting out of a chair/moving?: No Do you have difficulty managing or taking your medications?: No  Follow up appointments reviewed: PCP Follow-up appointment confirmed?: Yes Date of PCP follow-up appointment?: 05/25/24 Follow-up Provider: [facility] Follow-up appointment confirmed?: NA Do you need transportation to your follow-up appointment?: No Do you understand care options if your condition(s) worsen?: Yes-patient verbalized understanding    SIGNATURE Darrall Ellison, LPN [facility] Nurse Health Advisor Direct Dial [phone]

## 2024-05-20 LAB — CULTURE, BLOOD (ROUTINE X 2)
Culture: NO GROWTH
Culture: NO GROWTH
Special Requests: ADEQUATE
Special Requests: ADEQUATE

## 2024-05-25 ENCOUNTER — Encounter: Payer: Self-pay | Admitting: Internal Medicine

## 2024-05-25 ENCOUNTER — Ambulatory Visit: Admitting: Internal Medicine

## 2024-05-25 VITALS — BP 110/72 | HR 69 | Ht 65.0 in | Wt 109.0 lb

## 2024-05-25 DIAGNOSIS — J324 Chronic pansinusitis: Secondary | ICD-10-CM | POA: Diagnosis not present

## 2024-05-25 DIAGNOSIS — J189 Pneumonia, unspecified organism: Secondary | ICD-10-CM

## 2024-05-25 DIAGNOSIS — D473 Essential (hemorrhagic) thrombocythemia: Secondary | ICD-10-CM | POA: Diagnosis not present

## 2024-05-25 DIAGNOSIS — E876 Hypokalemia: Secondary | ICD-10-CM | POA: Diagnosis not present

## 2024-05-25 DIAGNOSIS — J329 Chronic sinusitis, unspecified: Secondary | ICD-10-CM | POA: Insufficient documentation

## 2024-05-25 DIAGNOSIS — G309 Alzheimer's disease, unspecified: Secondary | ICD-10-CM

## 2024-05-25 DIAGNOSIS — E538 Deficiency of other specified B group vitamins: Secondary | ICD-10-CM

## 2024-05-25 DIAGNOSIS — F02818 Dementia in other diseases classified elsewhere, unspecified severity, with other behavioral disturbance: Secondary | ICD-10-CM

## 2024-05-25 MED ORDER — AMOXICILLIN-POT CLAVULANATE 875-125 MG PO TABS: 1.0000 | ORAL_TABLET | Freq: Two times a day (BID) | ORAL | 0 refills | Status: DC

## 2024-05-25 NOTE — Progress Notes (Signed)
 Subjective:  Patient ID: Janet Sherman, female    DOB: 12-15-1951  Age: 73 y.o. MRN: [id number]  CC: Hospitalization Follow-up [facility] Follow Up 05/15/24 SIRS and pneumonia. Poor historian and does not recall why they had went to the hospital. Patient notes having issues with right arm pain and tingling. Notes this issue has been going on for years. Has had imaging on that shoulder during hospital visit, would like results reviewed)   HPI Janet Sherman presents for CAP, sinusitis, dementia He is here w/her dtr  Per hx: "Admit date: 05/15/2024 Discharge date: 05/16/2024   Time spent: 35 minutes   Recommendations for Outpatient Follow-up:  Pcp f/u 1 week        Discharge Diagnoses:  Principal Problem:   Right sided numbness Active Problems:   HTN (hypertension)   Alzheimer's dementia with behavioral disturbance (HCC)     Discharge Condition: stable   Diet recommendation: heart healthy      Filed Weights    05/15/24 0101  Weight: 49 kg      History of present illness:  From admission h and p by dr. Lydia Sams: Janet Sherman is a 73 y.o. female with past medical history  of arthritis, gallstones, essential hypertension, migraine, history of pneumonia, allergy to mold and pollen coming for right arm and leg weakness and pain intermittently off and on for past week.  Chart review shows patient follows with hematology  for her essential thrombocytosis .         Hospital Course:  Hx dementia presenting with lethargy, found to be febrile to 100.8 with elevated lactate. In terms of symptoms has chronic right shoulder pain (nothing acute on CT here), daughter notes chronic cough that is worse past few days. Also complains of right leg weakness. CXR shows bibasilar infiltrates. Blood cultures ngtd. Urinalysis not strongly suggestive of infection and patient denies symptoms. Covid/flu/rsv and respiratory viral panel neg. CT shows chronic sinusitis, patient not complaining of sinus pain.  Most likely etiology for patient's fever, cough, and lethargy is a respiratory infection, possible pneumonia. Ambulated well with PT, no home health needs. Given initial complaint of right leg weakness mri of brain was obtained, nothing acute. Symptomatically much improved by hospital day 1, no cough or dyspnea, fever resolved, tolerating po, ambulating well. Will discharge to complete course of abx (augmentin /azithromycin ) for CAP.   Procedures: none    Consultations: none   Discharge Exam:     Vitals:    05/16/24 0515 05/16/24 0724  BP: 104/69 111/69  Pulse: 85 85  Resp: 18 17  Temp: 98.9 F (37.2 C) 98.3 F (36.8 C)  SpO2: 90% 97%      General: NAD Cardiovascular: RRR Respiratory: rales at bases, otherwise clear   Discharge Instructions     Discharge Instructions       Diet - low sodium heart healthy   Complete by: As directed      Increase activity slowly   Complete by: As directed           Allergies as of 05/16/2024         Reactions    Mold Extract [trichophyton] Shortness Of Breath, Other (See Comments)    Wheezing, itchy eyes, and runny nose, also    Pollen Extract Shortness Of Breath, Other (See Comments)    Wheezing, itchy eyes, and runny nose, also            Medication List       TAKE these medications  amoxicillin -clavulanate 875-125 MG tablet Commonly known as: AUGMENTIN  Take 1 tablet by mouth 2 (two) times daily for 6 days.    aspirin  EC 81 MG tablet Take 81 mg by mouth daily. Swallow whole.    azithromycin  250 MG tablet Commonly known as: ZITHROMAX  Take 1 tablet (250 mg total) by mouth daily for 3 days. Starting 5/21    donepezil  10 MG tablet Commonly known as: ARICEPT  Take 1 tablet (10 mg total) by mouth at bedtime.    hydroxyurea  500 MG capsule Commonly known as: HYDREA  Take 1 capsule (500 mg total) by mouth as directed. May take with food to minimize GI side effects. Take 1 pill daily Monday Through Friday, do not take on  Saturday/Sunday            Outpatient Medications Prior to Visit  Medication Sig Dispense Refill   aspirin  EC 81 MG tablet Take 81 mg by mouth daily. Swallow whole.     donepezil  (ARICEPT ) 10 MG tablet Take 1 tablet (10 mg total) by mouth at bedtime. 90 tablet 3   hydroxyurea  (HYDREA ) 500 MG capsule Take 1 capsule (500 mg total) by mouth as directed. May take with food to minimize GI side effects. Take 1 pill daily Monday Through Friday, do not take on Saturday/Sunday 90 capsule 1   No facility-administered medications prior to visit.   " ROS: Review of Systems  Constitutional:  Positive for fatigue. Negative for activity change, appetite change, chills and unexpected weight change.  HENT:  Positive for congestion. Negative for mouth sores and sinus pressure.   Eyes:  Negative for visual disturbance.  Respiratory:  Positive for cough. Negative for chest tightness.   Gastrointestinal:  Negative for abdominal pain and nausea.  Genitourinary:  Negative for difficulty urinating, frequency and vaginal pain.  Musculoskeletal:  Negative for back pain and gait problem.  Skin:  Negative for pallor and rash.  Neurological:  Positive for weakness and headaches. Negative for dizziness, tremors and numbness.  Psychiatric/Behavioral:  Positive for confusion and decreased concentration. Negative for sleep disturbance and suicidal ideas. The patient is not nervous/anxious.     Objective:  BP 110/72   Pulse 69   Ht 5\' 5"  (1.651 m)   Wt 109 lb (49.4 kg)   SpO2 93%   BMI 18.14 kg/m   BP Readings from Last 3 Encounters:  05/25/24 110/72  05/16/24 111/69  03/03/24 133/80    Wt Readings from Last 3 Encounters:  05/25/24 109 lb (49.4 kg)  05/15/24 108 lb 0.4 oz (49 kg)  03/03/24 108 lb 6.4 oz (49.2 kg)    Physical Exam Constitutional:      General: She is not in acute distress.    Appearance: Normal appearance. She is well-developed.  HENT:     Head: Normocephalic.     Right Ear:  External ear normal.     Left Ear: External ear normal.     Nose: Nose normal.  Eyes:     General:        Right eye: No discharge.        Left eye: No discharge.     Conjunctiva/sclera: Conjunctivae normal.     Pupils: Pupils are equal, round, and reactive to light.  Neck:     Thyroid : No thyromegaly.     Vascular: No JVD.     Trachea: No tracheal deviation.  Cardiovascular:     Rate and Rhythm: Normal rate and regular rhythm.     Heart sounds: Normal heart  sounds.  Pulmonary:     Effort: No respiratory distress.     Breath sounds: No stridor. No wheezing.  Abdominal:     General: Bowel sounds are normal. There is no distension.     Palpations: Abdomen is soft. There is no mass.     Tenderness: There is no abdominal tenderness. There is no guarding or rebound.  Musculoskeletal:        General: No tenderness.     Cervical back: Normal range of motion and neck supple. No rigidity.     Right lower leg: No edema.     Left lower leg: No edema.  Lymphadenopathy:     Cervical: No cervical adenopathy.  Skin:    Findings: No erythema or rash.  Neurological:     Mental Status: Mental status is at baseline.     Cranial Nerves: No cranial nerve deficit.     Motor: No abnormal muscle tone.     Coordination: Coordination normal.     Deep Tendon Reflexes: Reflexes normal.  Psychiatric:        Behavior: Behavior normal.        Thought Content: Thought content normal.   Thin, quiet  Lab Results  Component Value Date   WBC 11.6 (H) 05/15/2024   HGB 9.8 (L) 05/15/2024   HCT 29.9 (L) 05/15/2024   PLT 291 05/15/2024   GLUCOSE 87 05/16/2024   CHOL 230 (H) 09/14/2023   TRIG 238.0 (H) 09/14/2023   HDL 57.30 09/14/2023   LDLDIRECT 112.0 07/01/2022   LDLCALC 125 (H) 09/14/2023   ALT 8 05/15/2024   AST 17 05/15/2024   NA 139 05/16/2024   K 3.7 05/16/2024   CL 105 05/16/2024   CREATININE 1.14 (H) 05/16/2024   BUN 7 (L) 05/16/2024   CO2 25 05/16/2024   TSH 2.61 09/14/2023   INR  1.2 05/15/2024   HGBA1C 5.3 12/26/2016   MICROALBUR 0.98 08/29/2009    MR BRAIN WO CONTRAST Result Date: 05/15/2024 CLINICAL DATA:  Provided history: Weakness. EXAM: MRI HEAD WITHOUT CONTRAST TECHNIQUE: Multiplanar, multiecho pulse sequences of the brain and surrounding structures were obtained without intravenous contrast. COMPARISON:  Head CT 05/15/2024.  Brain MRI 08/14/2016. FINDINGS: Brain: Mild generalized cerebral atrophy. Unchanged small T2 hyperintense foci within the left thalamus, which may reflect prominent perivascular spaces and/or chronic lacunar infarcts. No cortical encephalomalacia is identified. No significant cerebral white matter disease for age. There is no acute infarct. No evidence of an intracranial mass. Specifically, there is no evidence of a residual/recurrent intrasellar mass within limitations of a non-contrast brain MRI. No extra-axial fluid collection. No midline shift. Vascular: Maintained flow voids within the proximal large arterial vessels. Skull and upper cervical spine: No focal worrisome marrow lesion. Incompletely assessed cervical spondylosis. Sinuses/Orbits: No mass or acute finding within the imaged orbits. Postsurgical appearance of the paranasal sinuses. Persistent pansinusitis. Most notably, there is near complete opacification of the right sphenoid sinus, complete opacification of the left sphenoid sinus and near complete opacification of the left ethmoid air cells. IMPRESSION: 1.  No acute intracranial finding. 2. Prominent perivascular spaces and/or chronic lacunar infarcts within the left thalamus, unchanged from the prior MRI of 08/14/2016. 3. Mild generalized cerebral atrophy. 4. Pansinusitis (most notably severe bilateral sphenoid and left ethmoid sinusitis). Correlate for signs/symptoms of acute sinusitis and consider ENT referral. Electronically Signed   By: Bascom Lily D.O.   On: 05/15/2024 12:58   CT SHOULDER RIGHT WO CONTRAST Result Date:  05/15/2024 CLINICAL DATA:  Upper extremity pain. EXAM: CT OF THE UPPER RIGHT EXTREMITY WITHOUT CONTRAST TECHNIQUE: Multidetector CT imaging of the upper right extremity was performed according to the standard protocol. RADIATION DOSE REDUCTION: This exam was performed according to the departmental dose-optimization program which includes automated exposure control, adjustment of the mA and/or kV according to patient size and/or use of iterative reconstruction technique. COMPARISON:  None Available. FINDINGS: Bones/Joint/Cartilage No signs of acute fracture or dislocation. Mild degenerative changes at the acromioclavicular joint. Glenohumeral joint space narrowing with marginal spur formation Ligaments Suboptimally assessed by CT. Muscles and Tendons No mass or hematoma identified.  No signs of inflammation. Soft tissues Unremarkable. Signs of emphysema with diffuse bronchial wall thickening noted within the imaged portions of the right lung. IMPRESSION: 1. No acute findings. 2. Mild degenerative changes at the acromioclavicular joint and glenohumeral joint. 3. Signs of emphysema with diffuse bronchial wall thickening noted within the imaged portions of the right lung. 4.  Emphysema (ICD10-J43.9). Electronically Signed   By: Kimberley Penman M.D.   On: 05/15/2024 05:44   CT HEAD WO CONTRAST ( ) Result Date: 05/15/2024 CLINICAL DATA:  worsening pain and weakness in right arm and right leg EXAM: CT HEAD WITHOUT CONTRAST TECHNIQUE: Contiguous axial images were obtained from the base of the skull through the vertex without intravenous contrast. RADIATION DOSE REDUCTION: This exam was performed according to the departmental dose-optimization program which includes automated exposure control, adjustment of the mA and/or kV according to patient size and/or use of iterative reconstruction technique. COMPARISON:  CT head 07/31/2022. FINDINGS: Brain: No evidence of acute infarction, hemorrhage, hydrocephalus, extra-axial  collection or mass lesion/mass effect. Vascular: No hyperdense vessel or unexpected calcification. Skull: No acute fracture. Sinuses/Orbits: Extensive paranasal sinus disease including complete opacification of the sphenoid sinus with destructive change of the anterior sphenoid walls and surrounding osteitis suggestive of chronic sinusitis. Additional mucosal thickening of the remaining sinuses. IMPRESSION: 1. No evidence of acute intracranial abnormality. 2. Findings suggestive of chronic severe sinusitis, detailed above. Correlate for signs/symptoms of acute sinusitis and consider ENT follow-up. Electronically Signed   By: Stevenson Elbe M.D.   On: 05/15/2024 02:11   DG Chest 2 View Result Date: 05/15/2024 CLINICAL DATA:  Suspected sepsis EXAM: CHEST - 2 VIEW COMPARISON:  01/01/2023 FINDINGS: There is hyperinflation of the lungs compatible with COPD. Heart and mediastinal contours within normal limits. Airspace opacities in both lower lobes could reflect atelectasis or pneumonia. No effusions. No acute bony abnormality. IMPRESSION: COPD. Bibasilar airspace opacities, left greater than right, atelectasis versus pneumonia. Electronically Signed   By: Janeece Mechanic M.D.   On: 05/15/2024 01:28    Assessment & Plan:   Problem List Items Addressed This Visit     B12 deficiency   On B12      Essential thrombocytosis (HCC)   Essential Thrombocytosis, JAK2 Positive  F/u w/Dr Rosaline Coma Check CBC On hydroxyurea , ASA      Relevant Medications   amoxicillin -clavulanate (AUGMENTIN ) 875-125 MG tablet   Other Relevant Orders   Basic metabolic panel with GFR   CBC with Differential/Platelet   Alzheimer's dementia with behavioral disturbance (HCC)    Off Fluoxetine  po      CAP (community acquired pneumonia) - Primary    Better Complete course of abx (augmentin /azithromycin ) for CAP at home CXR in 1 month      Relevant Medications   amoxicillin -clavulanate (AUGMENTIN ) 875-125 MG tablet    Sinusitis, chronic   Pansinusitis on the MRI ENT ref Brain MRI "Pansinusitis (most  notably severe bilateral sphenoid and left ethmoid sinusitis). Correlate for signs/symptoms of acute sinusitis and consider ENT referral." Will use Augmentin  fr 3 more weeks plus a probiotic       Relevant Medications   amoxicillin -clavulanate (AUGMENTIN ) 875-125 MG tablet   Other Relevant Orders   Ambulatory referral to ENT   Hypokalemia   Check BMET      Relevant Orders   Basic metabolic panel with GFR      Meds ordered this encounter  Medications   DISCONTD: amoxicillin -clavulanate (AUGMENTIN ) 875-125 MG tablet    Sig: Take 1 tablet by mouth 2 (two) times daily.    Dispense:  20 tablet    Refill:  0   amoxicillin -clavulanate (AUGMENTIN ) 875-125 MG tablet    Sig: Take 1 tablet by mouth 2 (two) times daily.    Dispense:  42 tablet    Refill:  0      Follow-up: Return in about 6 weeks (around 07/06/2024) for a follow-up visit.  Anitra Barn, MD

## 2024-05-25 NOTE — Assessment & Plan Note (Signed)
 Essential Thrombocytosis, JAK2 Positive  F/u w/Dr Rosaline Coma Check CBC On hydroxyurea , ASA

## 2024-05-25 NOTE — Assessment & Plan Note (Addendum)
 Pansinusitis on the MRI ENT ref Brain MRI "Pansinusitis (most notably severe bilateral sphenoid and left ethmoid sinusitis). Correlate for signs/symptoms of acute sinusitis and consider ENT referral." Will use Augmentin  fr 3 more weeks plus a probiotic

## 2024-05-25 NOTE — Assessment & Plan Note (Signed)
 Better Complete course of abx (augmentin /azithromycin ) for CAP at home CXR in 1 month

## 2024-05-25 NOTE — Assessment & Plan Note (Signed)
Check BMET 

## 2024-05-25 NOTE — Assessment & Plan Note (Signed)
 On B12

## 2024-05-25 NOTE — Assessment & Plan Note (Signed)
  Off Fluoxetine  po

## 2024-06-01 ENCOUNTER — Inpatient Hospital Stay: Attending: Internal Medicine

## 2024-06-01 ENCOUNTER — Ambulatory Visit: Admitting: Hematology and Oncology

## 2024-06-01 ENCOUNTER — Other Ambulatory Visit: Payer: Self-pay | Admitting: Hematology and Oncology

## 2024-06-01 DIAGNOSIS — D473 Essential (hemorrhagic) thrombocythemia: Secondary | ICD-10-CM

## 2024-06-01 NOTE — Progress Notes (Deleted)
 [facility] [phone]   [phone]  PROGRESS NOTE  Patient Care Team: Plotnikov, Oakley Bellman, MD as PCP - General (Internal Medicine)  Hematological/Oncological History # Essential Thrombocytosis, JAK2 positive  11/13/2021: WBC 12.5, Hgb 14.5, MCV 79.1, Plt 852 07/01/2022: WBC 10.2, Hgb 13.2, MCV 79.5, Plt 817 07/27/2022: establish care with Dr. Rosaline Coma  10/15/2022: start of hydroxyurea  500 mg PO daily.  11/18/2022: increase to hydroxyurea  500 mg BID 12/17/2022: WBC 8.4, Hgb 12.3, MCV 80.6, Plt 565 05/11/2023: WBC 4.5, hemoglobin 10.3, MCV 111.6, and platelets of 455  decrease dose due to anemia by taking medication twice daily on Monday, Wednesday and Friday. Then remaining days will be daily.  12/24/2023: Hgb 9.8, stopped hydrea  therapy 01/06/2024: WBC  6.7, Hgb 10.8, MCV 102.5, Plt 481.  Recommend restart hydrea  therapy daily M-F and holding medication on Sat-Sun    Interval History:  Janet Sherman 73 y.o. female with medical history significant for essential thrombocytosis who presents for a follow up visit. The patient's last visit was on 01/06/2024. In the interim since the last visit she has continued hydroxyurea  therapy.   On exam today Janet Sherman is unaccompanied for this visit. She reports her energy levels are fairly stable. She is able to tolerate her hydrea  therapy without any side effects. She has a good appetite and weight is unchanged since January 2025. She denies nausea, vomiting or bowel habit changes. She denies easy bruising or signs of bleeding.  She continues taking the aspirin  daily. Otherwise she has had no major changes in her health in the interim since our last discussion. She denies fevers, chills, sweats, shortness of breath, chest pain or cough. Overall she is willing and able to proceed with hydroxyurea  therapy at this time.  A full 10 point ROS was otherwise negative.   MEDICAL HISTORY:  Past Medical History:  Diagnosis  Date   Arthritis    "right hand" (01/22/2017)   Gallstones    History of kidney stones    Hypertension    Migraine    "a few/year" (01/22/2017)   Pneumonia X 1    SURGICAL HISTORY: Past Surgical History:  Procedure Laterality Date   CRANIOTOMY N/A 10/16/2016   Procedure: Pituitary tumor (benign) resection - CRANIOTOMY HYPOPHYSECTOMY TRANSNASAL APPROACH;  Surgeon: Augusto Blonder, MD;  Location: [facility] OR;  Service: Neurosurgery;  Laterality: N/A;   ELECTROPHYSIOLOGIC STUDY N/A 01/22/2017   Procedure: SVT Ablation;  Surgeon: Will Cortland Ding, MD;  Location: [facility] INVASIVE CV LAB;  Service: Cardiovascular;  Laterality: N/A;   ELECTROPHYSIOLOGIC STUDY N/A 01/22/2017   Procedure: Electrophysiology Study;  Surgeon: Will Cortland Ding, MD;  Location: [facility] INVASIVE CV LAB;  Service: Cardiovascular;  Laterality: N/A;   gallstones  ~ 2000   KIDNEY STONE SURGERY     "cut me open"   SUPRAVENTRICULAR TACHYCARDIA ABLATION  01/22/2017   TRANSNASAL APPROACH N/A 10/16/2016   Procedure: TRANSNASAL APPROACH WITH FUSION;  Surgeon: Ammon Bales, MD;  Location: [facility] OR;  Service: ENT;  Laterality: N/A;   TUBAL LIGATION      SOCIAL HISTORY: Social History   Socioeconomic History   Marital status: Widowed    Spouse name: Royston Cornea   Number of children: 5   Years of education: 10   Highest education level: Not on file  Occupational History    Employer: [employer]    Comment: [employer]  Tobacco Use   Smoking status: Former    Current packs/day: 0.00    Types: Cigarettes  Quit date: 10/16/2016    Years since quitting: 7.6   Smokeless tobacco: Never  Vaping Use   Vaping status: Never Used  Substance and Sexual Activity   Alcohol use: No   Drug use: No   Sexual activity: Never  Other Topics Concern   Not on file  Social History Narrative   Patient is married(James) and lives at home with her husband.   Patient has 5 children.   Patient works full-time.   Patient has  a 10th  Grade education.   Patient is right handed.   Patient drinks 2-3 cups of coffee daily.   Social Drivers of Corporate investment banker Strain: Not on file  Food Insecurity: No Food Insecurity (05/15/2024)   Hunger Vital Sign    Worried About Running Out of Food in the Last Year: Never true    Ran Out of Food in the Last Year: Never true  Transportation Needs: No Transportation Needs (05/15/2024)   PRAPARE - Administrator, Civil Service (Medical): No    Lack of Transportation (Non-Medical): No  Physical Activity: Not on file  Stress: Not on file  Social Connections: Moderately Isolated (05/15/2024)   Social Connection and Isolation Panel [NHANES]    Frequency of Communication with Friends and Family: More than three times a week    Frequency of Social Gatherings with Friends and Family: Three times a week    Attends Religious Services: More than 4 times per year    Active Member of Clubs or Organizations: No    Attends Banker Meetings: Never    Marital Status: Widowed  Intimate Partner Violence: Not At Risk (05/15/2024)   Humiliation, Afraid, Rape, and Kick questionnaire    Fear of Current or Ex-Partner: No    Emotionally Abused: No    Physically Abused: No    Sexually Abused: No    FAMILY HISTORY: Family History  Problem Relation Age of Onset   Hypertension Mother    Cancer Father        lung   Hypertension Sister    Hypertension Brother     ALLERGIES:  is allergic to mold extract [trichophyton] and pollen extract.  MEDICATIONS:  Current Outpatient Medications  Medication Sig Dispense Refill   amoxicillin -clavulanate (AUGMENTIN ) 875-125 MG tablet Take 1 tablet by mouth 2 (two) times daily. 42 tablet 0   aspirin  EC 81 MG tablet Take 81 mg by mouth daily. Swallow whole.     donepezil  (ARICEPT ) 10 MG tablet Take 1 tablet (10 mg total) by mouth at bedtime. 90 tablet 3   hydroxyurea  (HYDREA ) 500 MG capsule Take 1 capsule (500 mg total) by  mouth as directed. May take with food to minimize GI side effects. Take 1 pill daily Monday Through Friday, do not take on Saturday/Sunday 90 capsule 1   No current facility-administered medications for this visit.    REVIEW OF SYSTEMS:   Constitutional: ( - ) fevers, ( - )  chills , ( - ) night sweats Eyes: ( - ) blurriness of vision, ( - ) double vision, ( - ) watery eyes Ears, nose, mouth, throat, and face: ( - ) mucositis, ( - ) sore throat Respiratory: ( - ) cough, ( - ) dyspnea, ( - ) wheezes Cardiovascular: ( - ) palpitation, ( - ) chest discomfort, ( - ) lower extremity swelling Gastrointestinal:  ( - ) nausea, ( - ) heartburn, ( - ) change in bowel habits Skin: ( - )  abnormal skin rashes Lymphatics: ( - ) new lymphadenopathy, ( - ) easy bruising Neurological: ( - ) numbness, ( - ) tingling, ( - ) new weaknesses Behavioral/Psych: ( - ) mood change, ( - ) new changes  All other systems were reviewed with the patient and are negative.  PHYSICAL EXAMINATION: ECOG PERFORMANCE STATUS: 0 - Asymptomatic  There were no vitals filed for this visit.     There were no vitals filed for this visit.   GENERAL: Well-appearing elderly African-American female, alert, no distress and comfortable SKIN: skin color, texture, turgor are normal, no rashes or significant lesions EYES: conjunctiva are pink and non-injected, sclera clear LUNGS: clear to auscultation and percussion with normal breathing effort HEART: regular rate & rhythm and no murmurs and no lower extremity edema Musculoskeletal: no cyanosis of digits and no clubbing  PSYCH: alert & oriented x 3, fluent speech NEURO: no focal motor/sensory deficits  LABORATORY DATA:  I have reviewed the data as listed    Latest Ref Rng & Units 05/15/2024    5:25 AM 05/15/2024    1:11 AM 03/03/2024    8:21 AM  CBC  WBC 4.0 - 10.5 K/uL 11.6  11.9  4.9   Hemoglobin 12.0 - 15.0 g/dL 9.8  16.1  09.6   Hematocrit 36.0 - 46.0 % 29.9  36.5  34.7    Platelets 150 - 400 K/uL 291  369  325        Latest Ref Rng & Units 05/16/2024    7:02 AM 05/15/2024    5:25 AM 05/15/2024    1:11 AM  CMP  Glucose 70 - 99 mg/dL 87  045  409   BUN 8 - 23 mg/dL 7  10  11    Creatinine 0.44 - 1.00 mg/dL 8.11  9.14  7.82   Sodium 135 - 145 mmol/L 139  137  137   Potassium 3.5 - 5.1 mmol/L 3.7  3.3  3.3   Chloride 98 - 111 mmol/L 105  106  102   CO2 22 - 32 mmol/L 25  22  22    Calcium  8.9 - 10.3 mg/dL 8.7  8.5  9.4   Total Protein 6.5 - 8.1 g/dL  5.1  6.9   Total Bilirubin 0.0 - 1.2 mg/dL  1.0  0.8   Alkaline Phos 38 - 126 U/L  35  42   AST 15 - 41 U/L  17  22   ALT 0 - 44 U/L  8  10     RADIOGRAPHIC STUDIES: MR BRAIN WO CONTRAST Result Date: 05/15/2024 CLINICAL DATA:  Provided history: Weakness. EXAM: MRI HEAD WITHOUT CONTRAST TECHNIQUE: Multiplanar, multiecho pulse sequences of the brain and surrounding structures were obtained without intravenous contrast. COMPARISON:  Head CT 05/15/2024.  Brain MRI 08/14/2016. FINDINGS: Brain: Mild generalized cerebral atrophy. Unchanged small T2 hyperintense foci within the left thalamus, which may reflect prominent perivascular spaces and/or chronic lacunar infarcts. No cortical encephalomalacia is identified. No significant cerebral white matter disease for age. There is no acute infarct. No evidence of an intracranial mass. Specifically, there is no evidence of a residual/recurrent intrasellar mass within limitations of a non-contrast brain MRI. No extra-axial fluid collection. No midline shift. Vascular: Maintained flow voids within the proximal large arterial vessels. Skull and upper cervical spine: No focal worrisome marrow lesion. Incompletely assessed cervical spondylosis. Sinuses/Orbits: No mass or acute finding within the imaged orbits. Postsurgical appearance of the paranasal sinuses. Persistent pansinusitis. Most notably, there is near complete  opacification of the right sphenoid sinus, complete opacification  of the left sphenoid sinus and near complete opacification of the left ethmoid air cells. IMPRESSION: 1.  No acute intracranial finding. 2. Prominent perivascular spaces and/or chronic lacunar infarcts within the left thalamus, unchanged from the prior MRI of 08/14/2016. 3. Mild generalized cerebral atrophy. 4. Pansinusitis (most notably severe bilateral sphenoid and left ethmoid sinusitis). Correlate for signs/symptoms of acute sinusitis and consider ENT referral. Electronically Signed   By: Bascom Lily D.O.   On: 05/15/2024 12:58   CT SHOULDER RIGHT WO CONTRAST Result Date: 05/15/2024 CLINICAL DATA:  Upper extremity pain. EXAM: CT OF THE UPPER RIGHT EXTREMITY WITHOUT CONTRAST TECHNIQUE: Multidetector CT imaging of the upper right extremity was performed according to the standard protocol. RADIATION DOSE REDUCTION: This exam was performed according to the departmental dose-optimization program which includes automated exposure control, adjustment of the mA and/or kV according to patient size and/or use of iterative reconstruction technique. COMPARISON:  None Available. FINDINGS: Bones/Joint/Cartilage No signs of acute fracture or dislocation. Mild degenerative changes at the acromioclavicular joint. Glenohumeral joint space narrowing with marginal spur formation Ligaments Suboptimally assessed by CT. Muscles and Tendons No mass or hematoma identified.  No signs of inflammation. Soft tissues Unremarkable. Signs of emphysema with diffuse bronchial wall thickening noted within the imaged portions of the right lung. IMPRESSION: 1. No acute findings. 2. Mild degenerative changes at the acromioclavicular joint and glenohumeral joint. 3. Signs of emphysema with diffuse bronchial wall thickening noted within the imaged portions of the right lung. 4.  Emphysema (ICD10-J43.9). Electronically Signed   By: Kimberley Penman M.D.   On: 05/15/2024 05:44   CT HEAD WO CONTRAST ( ) Result Date: 05/15/2024 CLINICAL DATA:   worsening pain and weakness in right arm and right leg EXAM: CT HEAD WITHOUT CONTRAST TECHNIQUE: Contiguous axial images were obtained from the base of the skull through the vertex without intravenous contrast. RADIATION DOSE REDUCTION: This exam was performed according to the departmental dose-optimization program which includes automated exposure control, adjustment of the mA and/or kV according to patient size and/or use of iterative reconstruction technique. COMPARISON:  CT head 07/31/2022. FINDINGS: Brain: No evidence of acute infarction, hemorrhage, hydrocephalus, extra-axial collection or mass lesion/mass effect. Vascular: No hyperdense vessel or unexpected calcification. Skull: No acute fracture. Sinuses/Orbits: Extensive paranasal sinus disease including complete opacification of the sphenoid sinus with destructive change of the anterior sphenoid walls and surrounding osteitis suggestive of chronic sinusitis. Additional mucosal thickening of the remaining sinuses. IMPRESSION: 1. No evidence of acute intracranial abnormality. 2. Findings suggestive of chronic severe sinusitis, detailed above. Correlate for signs/symptoms of acute sinusitis and consider ENT follow-up. Electronically Signed   By: Stevenson Elbe M.D.   On: 05/15/2024 02:11   DG Chest 2 View Result Date: 05/15/2024 CLINICAL DATA:  Suspected sepsis EXAM: CHEST - 2 VIEW COMPARISON:  01/01/2023 FINDINGS: There is hyperinflation of the lungs compatible with COPD. Heart and mediastinal contours within normal limits. Airspace opacities in both lower lobes could reflect atelectasis or pneumonia. No effusions. No acute bony abnormality. IMPRESSION: COPD. Bibasilar airspace opacities, left greater than right, atelectasis versus pneumonia. Electronically Signed   By: Janeece Mechanic M.D.   On: 05/15/2024 01:28    ASSESSMENT & PLAN Janet Sherman is a 73 y.o. female with medical history significant for essential thrombocytosis who presents for a  follow up visit.   Today we discussed the diagnosis of essential thrombocytosis.  Essential thrombocytosis and myeloproliferative neoplasm by the patient has  overproduction of a particular blood cell.  In this case the patient has a JAK2 mutation which is causing thrombocytosis.  Goals of treatment including monitoring for progression to myelofibrosis and reducing risk of VTE.  Recommend aspirin  81 mg p.o. daily and cytoreductive therapy with hydroxyurea .  #Essential Thrombocytosis, JAK2 Positive -- Diagnosis confirmed by the presence of JAK2 and bone marrow biopsy showing myeloproliferative neoplasm. -- Patient currently on 81 mg baby aspirin  daily, recommend continuation of this -- Labs today show white blood cell count *** -- Currently taking Hydroxyurea  500 mg daily M-F and holding medication on Sat-Sun. Continue without any further dose modifications.  -- Return to clinic in 3 months for evaluation on hydroxyurea  therapy.  #Macrocytic anemia: --Checked vitamin B12 and folate levels today due to acute onset of anemia, both normal   No orders of the defined types were placed in this encounter.   All questions were answered. The patient knows to call the clinic with any problems, questions or concerns.  I have spent a total of 30 minutes minutes of face-to-face and non-face-to-face time, preparing to see the patient, performing a medically appropriate examination, counseling and educating the patient,  documenting clinical information in the electronic health record, and care coordination.   Wyline Hearing PA-C Dept of Hematology and Oncology [facility] Phone: [phone]     06/01/2024 7:39 AM

## 2024-08-01 ENCOUNTER — Encounter (INDEPENDENT_AMBULATORY_CARE_PROVIDER_SITE_OTHER): Payer: Self-pay | Admitting: Otolaryngology

## 2024-08-01 ENCOUNTER — Ambulatory Visit (INDEPENDENT_AMBULATORY_CARE_PROVIDER_SITE_OTHER): Admitting: Otolaryngology

## 2024-08-01 VITALS — BP 110/73 | HR 82 | Wt 103.0 lb

## 2024-08-01 DIAGNOSIS — Z87891 Personal history of nicotine dependence: Secondary | ICD-10-CM | POA: Diagnosis not present

## 2024-08-01 DIAGNOSIS — J324 Chronic pansinusitis: Secondary | ICD-10-CM

## 2024-08-01 DIAGNOSIS — J338 Other polyp of sinus: Secondary | ICD-10-CM

## 2024-08-01 DIAGNOSIS — J343 Hypertrophy of nasal turbinates: Secondary | ICD-10-CM | POA: Diagnosis not present

## 2024-08-01 DIAGNOSIS — R0981 Nasal congestion: Secondary | ICD-10-CM

## 2024-08-01 MED ORDER — FLUTICASONE PROPIONATE 50 MCG/ACT NA SUSP: 2.0000 | Freq: Every day | NASAL | 10 refills | Status: AC

## 2024-08-01 MED ORDER — AMOXICILLIN-POT CLAVULANATE 875-125 MG PO TABS: 1.0000 | ORAL_TABLET | Freq: Two times a day (BID) | ORAL | 0 refills | Status: AC

## 2024-08-01 NOTE — Progress Notes (Unsigned)
 CC: Chronic rhinosinusitis  HPI:  Janet Sherman is a 73 y.o. female who presents today complaining of chronic rhinosinusitis for many years.  Her symptoms include recurrent facial pain and pressure, nasal congestion, and nasal drainage.  Her last CT scan in May 2025 showed bilateral pansinusitis, worse in her sphenoid and ethmoid sinuses.  She was treated with multiple courses of antibiotics.  The last 1 was 1 month ago.  She previously underwent bilateral endoscopic sinus surgery more than 10 years ago.  She has a history of environmental allergies.  She was previously treated with steroid nasal sprays and allergy medications without improvement in her symptoms.  Currently she complains of persistent pressure within her midface.  She denies any fever or visual change.  Past Medical History:  Diagnosis Date   Arthritis    right hand (01/22/2017)   Gallstones    History of kidney stones    Hypertension    Migraine    a few/year (01/22/2017)   Pneumonia X 1    Past Surgical History:  Procedure Laterality Date   CRANIOTOMY N/A 10/16/2016   Procedure: Pituitary tumor (benign) resection - CRANIOTOMY HYPOPHYSECTOMY TRANSNASAL APPROACH;  Surgeon: Gerldine Maizes, MD;  Location: [facility] OR;  Service: Neurosurgery;  Laterality: N/A;   ELECTROPHYSIOLOGIC STUDY N/A 01/22/2017   Procedure: SVT Ablation;  Surgeon: Will Gladis Norton, MD;  Location: [facility] INVASIVE CV LAB;  Service: Cardiovascular;  Laterality: N/A;   ELECTROPHYSIOLOGIC STUDY N/A 01/22/2017   Procedure: Electrophysiology Study;  Surgeon: Will Gladis Norton, MD;  Location: [facility] INVASIVE CV LAB;  Service: Cardiovascular;  Laterality: N/A;   gallstones  ~ 2000   KIDNEY STONE SURGERY     cut me open   SUPRAVENTRICULAR TACHYCARDIA ABLATION  01/22/2017   TRANSNASAL APPROACH N/A 10/16/2016   Procedure: TRANSNASAL APPROACH WITH FUSION;  Surgeon: Alm Bouche, MD;  Location: [facility] OR;  Service: ENT;  Laterality: N/A;   TUBAL LIGATION       Family History  Problem Relation Age of Onset   Hypertension Mother    Cancer Father        lung   Hypertension Sister    Hypertension Brother     Social History:  reports that she quit smoking about 7 years ago. Her smoking use included cigarettes. She has never used smokeless tobacco. She reports that she does not drink alcohol and does not use drugs.  Allergies:  Allergies  Allergen Reactions   Mold Extract [Trichophyton] Shortness Of Breath and Other (See Comments)    Wheezing, itchy eyes, and runny nose, also   Pollen Extract Shortness Of Breath and Other (See Comments)    Wheezing, itchy eyes, and runny nose, also    Prior to Admission medications   Medication Sig Start Date End Date Taking? Authorizing Provider  amoxicillin -clavulanate (AUGMENTIN ) 875-125 MG tablet Take 1 tablet by mouth 2 (two) times daily for 10 days. 08/01/24 08/11/24 Yes Karis Clunes, MD  aspirin  EC 81 MG tablet Take 81 mg by mouth daily. Swallow whole.   Yes [provider]  donepezil  (ARICEPT ) 10 MG tablet Take 1 tablet (10 mg total) by mouth at bedtime. 09/14/23  Yes Plotnikov, Karlynn GAILS, MD  fluticasone  (FLONASE ) 50 MCG/ACT nasal spray Place 2 sprays into both nostrils daily. 08/01/24 08/31/24 Yes Karis Clunes, MD  hydroxyurea  (HYDREA ) 500 MG capsule Take 1 capsule (500 mg total) by mouth as directed. May take with food to minimize GI side effects. Take 1 pill daily Monday Through Friday, do not take  on Saturday/Sunday 01/06/24  Yes Dorsey, John T IV, MD  amoxicillin -clavulanate (AUGMENTIN ) 875-125 MG tablet Take 1 tablet by mouth 2 (two) times daily. Patient not taking: Reported on 08/01/2024 05/25/24   Plotnikov, Karlynn GAILS, MD    Blood pressure 110/73, pulse 82, weight 103 lb (46.7 kg), SpO2 94%. Exam: General: Communicates without difficulty, well nourished, no acute distress. Head: Normocephalic, no evidence injury, no tenderness, facial buttresses intact without stepoff. Face/sinus: No tenderness to  palpation and percussion. Facial movement is normal and symmetric. Eyes: PERRL, EOMI. No scleral icterus, conjunctivae clear. Neuro: CN II exam reveals vision grossly intact.  No nystagmus at any point of gaze. Ears: Auricles well formed without lesions.  Ear canals are intact without mass or lesion.  No erythema or edema is appreciated.  The TMs are intact without fluid. Nose: External evaluation reveals normal support and skin without lesions.  Dorsum is intact.  Anterior rhinoscopy reveals congested mucosa over anterior aspect of inferior turbinates and intact septum. Oral:  Oral cavity and oropharynx are intact, symmetric, without erythema or edema.  Mucosa is moist without lesions. Neck: Full range of motion without pain.  There is no significant lymphadenopathy.  No masses palpable.  Thyroid  bed within normal limits to palpation.  Parotid glands and submandibular glands equal bilaterally without mass.  Trachea is midline. Neuro:  CN 2-12 grossly intact.   Procedure:  Flexible Nasal Endoscopy: Description: Risks, benefits, and alternatives of flexible endoscopy were explained to the patient.  Specific mention was made of the risk of throat numbness with difficulty swallowing, possible bleeding from the nose and mouth, and pain from the procedure.  The patient gave oral consent to proceed.  The flexible scope was inserted into the right nasal cavity.  Endoscopy of the interior nasal cavity, superior, inferior, and middle meatus was performed. The sphenoid-ethmoid recess was examined. Edematous mucosa was noted.  Polypoid tissue and mucopurulent drainage are noted within the nasal cavity.  Olfactory cleft was obstructed by polypoid tissue.  Nasopharynx was clear.  Turbinates were hypertrophied but without mass.  The procedure was repeated on the contralateral side with similar findings.  The patient tolerated the procedure well.   Assessment: 1.  Bilateral chronic pansinusitis, with polypoid tissue  obstructing her nasal passageways and sinus drainage pathways.  Mucopurulent drainage is also noted bilaterally. 2.  Diffuse nasal mucosal congestion and bilateral inferior turbinate hypertrophy.  Plan: 1.  The physical exam and nasal endoscopy findings are reviewed with the patient. 2.  Augmentin  875 mg p.o. twice daily for 10 days. 3.  Flonase  nasal spray 2 sprays each nostril daily.  The importance of consistent daily use is discussed. 4.  Nasal saline irrigation daily. 5.  The patient will return for reevaluation in 3 weeks.  If she continues to be symptomatic, we will likely benefit from a revision endoscopic sinus surgery.  A repeat sinus CT scan will be obtained at that time.  Brilee Port W Kemoni Quesenberry 08/01/2024, 1:28 PM

## 2024-08-02 DIAGNOSIS — J338 Other polyp of sinus: Secondary | ICD-10-CM | POA: Insufficient documentation

## 2024-08-02 DIAGNOSIS — J343 Hypertrophy of nasal turbinates: Secondary | ICD-10-CM | POA: Insufficient documentation

## 2024-08-29 ENCOUNTER — Ambulatory Visit (INDEPENDENT_AMBULATORY_CARE_PROVIDER_SITE_OTHER): Admitting: Otolaryngology

## 2024-09-01 ENCOUNTER — Other Ambulatory Visit (INDEPENDENT_AMBULATORY_CARE_PROVIDER_SITE_OTHER)

## 2024-09-01 DIAGNOSIS — E876 Hypokalemia: Secondary | ICD-10-CM | POA: Diagnosis not present

## 2024-09-01 DIAGNOSIS — D473 Essential (hemorrhagic) thrombocythemia: Secondary | ICD-10-CM | POA: Diagnosis not present

## 2024-09-01 LAB — BASIC METABOLIC PANEL WITH GFR
BUN: 15 mg/dL (ref 6–23)
CO2: 30 meq/L (ref 19–32)
Calcium: 9.4 mg/dL (ref 8.4–10.5)
Chloride: 102 meq/L (ref 96–112)
Creatinine, Ser: 1.01 mg/dL (ref 0.40–1.20)
GFR: 55.67 mL/min — ABNORMAL LOW (ref >60.00)
Glucose, Bld: 81 mg/dL (ref 70–99)
Potassium: 4.9 meq/L (ref 3.5–5.1)
Sodium: 142 meq/L (ref 135–145)

## 2024-09-01 LAB — CBC WITH DIFFERENTIAL/PLATELET
Basophils Absolute: 0.1 K/uL (ref 0.0–0.1)
Basophils Relative: 1.1 % (ref 0.0–3.0)
Eosinophils Absolute: 0.2 K/uL (ref 0.0–0.7)
Eosinophils Relative: 3.3 % (ref 0.0–5.0)
HCT: 39.4 % (ref 36.0–46.0)
Hemoglobin: 13.1 g/dL (ref 12.0–15.0)
Lymphocytes Relative: 14.2 % (ref 12.0–46.0)
Lymphs Abs: 0.7 K/uL (ref 0.7–4.0)
MCHC: 33.1 g/dL (ref 30.0–36.0)
MCV: 88.6 fl (ref 78.0–100.0)
Monocytes Absolute: 0.4 K/uL (ref 0.1–1.0)
Monocytes Relative: 8.7 % (ref 3.0–12.0)
Neutro Abs: 3.7 K/uL (ref 1.4–7.7)
Neutrophils Relative %: 72.7 % (ref 43.0–77.0)
Platelets: 381 K/uL (ref 150.0–400.0)
RBC: 4.44 Mil/uL (ref 3.87–5.11)
RDW: 13.8 % (ref 11.5–15.5)
WBC: 5.1 K/uL (ref 4.0–10.5)

## 2024-09-04 ENCOUNTER — Ambulatory Visit: Payer: Self-pay | Admitting: Internal Medicine

## 2024-09-05 ENCOUNTER — Encounter: Payer: Self-pay | Admitting: Internal Medicine

## 2024-09-05 ENCOUNTER — Ambulatory Visit (INDEPENDENT_AMBULATORY_CARE_PROVIDER_SITE_OTHER): Admitting: Internal Medicine

## 2024-09-05 VITALS — BP 124/86 | HR 88 | Temp 97.7°F | Ht 63.0 in | Wt 104.4 lb

## 2024-09-05 DIAGNOSIS — J0141 Acute recurrent pansinusitis: Secondary | ICD-10-CM

## 2024-09-05 DIAGNOSIS — G309 Alzheimer's disease, unspecified: Secondary | ICD-10-CM | POA: Diagnosis not present

## 2024-09-05 DIAGNOSIS — Z1211 Encounter for screening for malignant neoplasm of colon: Secondary | ICD-10-CM

## 2024-09-05 DIAGNOSIS — E538 Deficiency of other specified B group vitamins: Secondary | ICD-10-CM

## 2024-09-05 DIAGNOSIS — F02818 Dementia in other diseases classified elsewhere, unspecified severity, with other behavioral disturbance: Secondary | ICD-10-CM | POA: Diagnosis not present

## 2024-09-05 DIAGNOSIS — I10 Essential (primary) hypertension: Secondary | ICD-10-CM | POA: Diagnosis not present

## 2024-09-05 DIAGNOSIS — Z23 Encounter for immunization: Secondary | ICD-10-CM

## 2024-09-05 MED ORDER — AMOXICILLIN-POT CLAVULANATE 875-125 MG PO TABS: 1.0000 | ORAL_TABLET | Freq: Two times a day (BID) | ORAL | 0 refills | Status: DC

## 2024-09-05 MED ORDER — B COMPLEX-C PO TABS: 1.0000 | ORAL_TABLET | Freq: Every day | ORAL | 3 refills | Status: AC

## 2024-09-05 MED ORDER — VITAMIN D3 50 MCG (2000 UT) PO CAPS: 2000.0000 [IU] | ORAL_CAPSULE | Freq: Every day | ORAL | 3 refills | Status: AC

## 2024-09-05 NOTE — Patient Instructions (Signed)
 We can add Namenda for dementia

## 2024-09-05 NOTE — Assessment & Plan Note (Signed)
Too sleepy, low BP Stop Metoprolol

## 2024-09-05 NOTE — Assessment & Plan Note (Signed)
 S/p ENT eval Re-start Augmentin  ( Dr Rojean Rx in August did not go through)

## 2024-09-05 NOTE — Assessment & Plan Note (Signed)
 On B12

## 2024-09-05 NOTE — Progress Notes (Signed)
 Subjective:  Patient ID: Janet Sherman, female    DOB: 05-21-1951  Age: 73 y.o. MRN: [id number]  CC: Cough (Started consistently coughing and getting worst the last couple weeks. Nothing she is taking is helping the cough. Wants to discuss higher dose of dementia medication. )   HPI Janet Sherman presents for memory loss - worse, sinusitis, wt loss  Outpatient Medications Prior to Visit  Medication Sig Dispense Refill   aspirin  EC 81 MG tablet Take 81 mg by mouth daily. Swallow whole.     donepezil  (ARICEPT ) 10 MG tablet Take 1 tablet (10 mg total) by mouth at bedtime. 90 tablet 3   hydroxyurea  (HYDREA ) 500 MG capsule Take 1 capsule (500 mg total) by mouth as directed. May take with food to minimize GI side effects. Take 1 pill daily Monday Through Friday, do not take on Saturday/Sunday 90 capsule 1   fluticasone  (FLONASE ) 50 MCG/ACT nasal spray Place 2 sprays into both nostrils daily. (Patient not taking: Reported on 09/05/2024) 16 g 10   amoxicillin -clavulanate (AUGMENTIN ) 875-125 MG tablet Take 1 tablet by mouth 2 (two) times daily. (Patient not taking: Reported on 09/05/2024) 42 tablet 0   No facility-administered medications prior to visit.    ROS: Review of Systems  Constitutional:  Positive for fatigue. Negative for activity change, appetite change, chills and unexpected weight change.  HENT:  Positive for congestion. Negative for mouth sores and sinus pressure.   Eyes:  Negative for visual disturbance.  Respiratory:  Negative for cough and chest tightness.   Gastrointestinal:  Negative for abdominal pain and nausea.  Genitourinary:  Negative for difficulty urinating, frequency and vaginal pain.  Musculoskeletal:  Positive for arthralgias. Negative for back pain and gait problem.  Skin:  Negative for pallor and rash.  Neurological:  Negative for dizziness, tremors, weakness, numbness and headaches.  Hematological:  Does not bruise/bleed easily.  Psychiatric/Behavioral:  Negative  for confusion and sleep disturbance.     Objective:  BP 124/86   Pulse 88   Temp 97.7 F (36.5 C) (Oral)   Ht 5' 3 (1.6 m)   Wt 104 lb 6.4 oz (47.4 kg)   SpO2 91%   BMI 18.49 kg/m   BP Readings from Last 3 Encounters:  09/05/24 124/86  08/01/24 110/73  05/25/24 110/72    Wt Readings from Last 3 Encounters:  09/05/24 104 lb 6.4 oz (47.4 kg)  08/01/24 103 lb (46.7 kg)  05/25/24 109 lb (49.4 kg)    Physical Exam Constitutional:      General: She is not in acute distress.    Appearance: Normal appearance. She is well-developed. She is not toxic-appearing.  HENT:     Head: Normocephalic.     Right Ear: External ear normal.     Left Ear: External ear normal.     Nose: Nose normal.  Eyes:     General:        Right eye: No discharge.        Left eye: No discharge.     Conjunctiva/sclera: Conjunctivae normal.     Pupils: Pupils are equal, round, and reactive to light.  Neck:     Thyroid : No thyromegaly.     Vascular: No JVD.     Trachea: No tracheal deviation.  Cardiovascular:     Rate and Rhythm: Normal rate and regular rhythm.     Heart sounds: Normal heart sounds.  Pulmonary:     Effort: No respiratory distress.     Breath sounds:  No stridor. No wheezing.  Abdominal:     General: Bowel sounds are normal. There is no distension.     Palpations: Abdomen is soft. There is no mass.     Tenderness: There is no abdominal tenderness. There is no guarding or rebound.  Musculoskeletal:        General: No tenderness.     Cervical back: Normal range of motion and neck supple. No rigidity.  Lymphadenopathy:     Cervical: No cervical adenopathy.  Skin:    Findings: No erythema or rash.  Neurological:     Mental Status: She is alert. Mental status is at baseline.     Cranial Nerves: No cranial nerve deficit.     Motor: No abnormal muscle tone.     Coordination: Coordination normal.     Gait: Gait normal.     Deep Tendon Reflexes: Reflexes normal.  Psychiatric:         Behavior: Behavior normal.        Thought Content: Thought content normal.        Judgment: Judgment normal.     Lab Results  Component Value Date   WBC 5.1 09/01/2024   HGB 13.1 09/01/2024   HCT 39.4 09/01/2024   PLT 381.0 09/01/2024   GLUCOSE 81 09/01/2024   CHOL 230 (H) 09/14/2023   TRIG 238.0 (H) 09/14/2023   HDL 57.30 09/14/2023   LDLDIRECT 112.0 07/01/2022   LDLCALC 125 (H) 09/14/2023   ALT 8 05/15/2024   AST 17 05/15/2024   NA 142 09/01/2024   K 4.9 09/01/2024   CL 102 09/01/2024   CREATININE 1.01 09/01/2024   BUN 15 09/01/2024   CO2 30 09/01/2024   TSH 2.61 09/14/2023   INR 1.2 05/15/2024   HGBA1C 5.3 12/26/2016   MICROALBUR 0.98 08/29/2009    MR BRAIN WO CONTRAST Result Date: 05/15/2024 CLINICAL DATA:  Provided history: Weakness. EXAM: MRI HEAD WITHOUT CONTRAST TECHNIQUE: Multiplanar, multiecho pulse sequences of the brain and surrounding structures were obtained without intravenous contrast. COMPARISON:  Head CT 05/15/2024.  Brain MRI 08/14/2016. FINDINGS: Brain: Mild generalized cerebral atrophy. Unchanged small T2 hyperintense foci within the left thalamus, which may reflect prominent perivascular spaces and/or chronic lacunar infarcts. No cortical encephalomalacia is identified. No significant cerebral white matter disease for age. There is no acute infarct. No evidence of an intracranial mass. Specifically, there is no evidence of a residual/recurrent intrasellar mass within limitations of a non-contrast brain MRI. No extra-axial fluid collection. No midline shift. Vascular: Maintained flow voids within the proximal large arterial vessels. Skull and upper cervical spine: No focal worrisome marrow lesion. Incompletely assessed cervical spondylosis. Sinuses/Orbits: No mass or acute finding within the imaged orbits. Postsurgical appearance of the paranasal sinuses. Persistent pansinusitis. Most notably, there is near complete opacification of the right sphenoid sinus,  complete opacification of the left sphenoid sinus and near complete opacification of the left ethmoid air cells. IMPRESSION: 1.  No acute intracranial finding. 2. Prominent perivascular spaces and/or chronic lacunar infarcts within the left thalamus, unchanged from the prior MRI of 08/14/2016. 3. Mild generalized cerebral atrophy. 4. Pansinusitis (most notably severe bilateral sphenoid and left ethmoid sinusitis). Correlate for signs/symptoms of acute sinusitis and consider ENT referral. Electronically Signed   By: Rockey Childs D.O.   On: 05/15/2024 12:58   CT SHOULDER RIGHT WO CONTRAST Result Date: 05/15/2024 CLINICAL DATA:  Upper extremity pain. EXAM: CT OF THE UPPER RIGHT EXTREMITY WITHOUT CONTRAST TECHNIQUE: Multidetector CT imaging of the upper right  extremity was performed according to the standard protocol. RADIATION DOSE REDUCTION: This exam was performed according to the departmental dose-optimization program which includes automated exposure control, adjustment of the mA and/or kV according to patient size and/or use of iterative reconstruction technique. COMPARISON:  None Available. FINDINGS: Bones/Joint/Cartilage No signs of acute fracture or dislocation. Mild degenerative changes at the acromioclavicular joint. Glenohumeral joint space narrowing with marginal spur formation Ligaments Suboptimally assessed by CT. Muscles and Tendons No mass or hematoma identified.  No signs of inflammation. Soft tissues Unremarkable. Signs of emphysema with diffuse bronchial wall thickening noted within the imaged portions of the right lung. IMPRESSION: 1. No acute findings. 2. Mild degenerative changes at the acromioclavicular joint and glenohumeral joint. 3. Signs of emphysema with diffuse bronchial wall thickening noted within the imaged portions of the right lung. 4.  Emphysema (ICD10-J43.9). Electronically Signed   By: Waddell Calk M.D.   On: 05/15/2024 05:44   CT HEAD WO CONTRAST ( ) Result Date:  05/15/2024 CLINICAL DATA:  worsening pain and weakness in right arm and right leg EXAM: CT HEAD WITHOUT CONTRAST TECHNIQUE: Contiguous axial images were obtained from the base of the skull through the vertex without intravenous contrast. RADIATION DOSE REDUCTION: This exam was performed according to the departmental dose-optimization program which includes automated exposure control, adjustment of the mA and/or kV according to patient size and/or use of iterative reconstruction technique. COMPARISON:  CT head 07/31/2022. FINDINGS: Brain: No evidence of acute infarction, hemorrhage, hydrocephalus, extra-axial collection or mass lesion/mass effect. Vascular: No hyperdense vessel or unexpected calcification. Skull: No acute fracture. Sinuses/Orbits: Extensive paranasal sinus disease including complete opacification of the sphenoid sinus with destructive change of the anterior sphenoid walls and surrounding osteitis suggestive of chronic sinusitis. Additional mucosal thickening of the remaining sinuses. IMPRESSION: 1. No evidence of acute intracranial abnormality. 2. Findings suggestive of chronic severe sinusitis, detailed above. Correlate for signs/symptoms of acute sinusitis and consider ENT follow-up. Electronically Signed   By: Gilmore GORMAN Molt M.D.   On: 05/15/2024 02:11   DG Chest 2 View Result Date: 05/15/2024 CLINICAL DATA:  Suspected sepsis EXAM: CHEST - 2 VIEW COMPARISON:  01/01/2023 FINDINGS: There is hyperinflation of the lungs compatible with COPD. Heart and mediastinal contours within normal limits. Airspace opacities in both lower lobes could reflect atelectasis or pneumonia. No effusions. No acute bony abnormality. IMPRESSION: COPD. Bibasilar airspace opacities, left greater than right, atelectasis versus pneumonia. Electronically Signed   By: Franky Crease M.D.   On: 05/15/2024 01:28    Assessment & Plan:   Problem List Items Addressed This Visit     Alzheimer's dementia with behavioral  disturbance (HCC)   We can add Namenda for dementia  Aricept  to 10 mg       B12 deficiency   On B12      Other Visit Diagnoses       Screening for colon cancer    -  Primary         Meds ordered this encounter  Medications   B Complex-C (B-COMPLEX WITH VITAMIN C) tablet    Sig: Take 1 tablet by mouth daily.    Dispense:  100 tablet    Refill:  3   Cholecalciferol (VITAMIN D3) 50 MCG (2000 UT) capsule    Sig: Take 1 capsule (2,000 Units total) by mouth daily.    Dispense:  100 capsule    Refill:  3   DISCONTD: amoxicillin -clavulanate (AUGMENTIN ) 875-125 MG tablet    Sig: Take 1 tablet  by mouth 2 (two) times daily.    Dispense:  42 tablet    Refill:  0   amoxicillin -clavulanate (AUGMENTIN ) 875-125 MG tablet    Sig: Take 1 tablet by mouth 2 (two) times daily.    Dispense:  42 tablet    Refill:  0      Follow-up: No follow-ups on file.  Marolyn Noel, MD

## 2024-09-05 NOTE — Addendum Note (Signed)
 Addended by: BEVELY CURTISTINE PARAS on: 09/05/2024 10:00 AM   Modules accepted: Orders

## 2024-09-05 NOTE — Assessment & Plan Note (Signed)
 We can add Namenda for dementia  Aricept  to 10 mg

## 2024-09-08 ENCOUNTER — Ambulatory Visit

## 2024-09-12 ENCOUNTER — Encounter: Payer: Self-pay | Admitting: Internal Medicine

## 2024-09-22 ENCOUNTER — Ambulatory Visit (INDEPENDENT_AMBULATORY_CARE_PROVIDER_SITE_OTHER)

## 2024-09-22 VITALS — Ht 63.0 in | Wt 104.0 lb

## 2024-09-22 DIAGNOSIS — Z1231 Encounter for screening mammogram for malignant neoplasm of breast: Secondary | ICD-10-CM | POA: Diagnosis not present

## 2024-09-22 DIAGNOSIS — Z0000 Encounter for general adult medical examination without abnormal findings: Secondary | ICD-10-CM

## 2024-09-22 NOTE — Progress Notes (Cosign Needed Addendum)
 Subjective:   Janet Sherman is a 73 y.o. who presents for a Medicare Wellness preventive visit.  As a reminder, Annual Wellness Visits don't include a physical exam, and some assessments may be limited, especially if this visit is performed virtually. We may recommend an in-person follow-up visit with your provider if needed.  Visit Complete: Virtual I connected with  [name] on 10/02/24 by a audio enabled telemedicine application and verified that I am speaking with the correct person using two identifiers.  Patient Location: Home  Provider Location: Office/Clinic  I discussed the limitations of evaluation and management by telemedicine. The patient expressed understanding and agreed to proceed.  Vital Signs: Because this visit was a virtual/telehealth visit, some criteria may be missing or patient reported. Any vitals not documented were not able to be obtained and vitals that have been documented are patient reported.  VideoDeclined- This patient declined Librarian, academic. Therefore the visit was completed with audio only.  Persons Participating in Visit: Patient assisted by Daughter, Bobbette Sharps.  AWV Questionnaire: No: Patient Medicare AWV questionnaire was not completed prior to this visit.  Cardiac Risk Factors include: advanced age (>63men, >33 women);dyslipidemia;hypertension     Objective:    Today's Vitals   09/22/24 1019  Weight: 104 lb (47.2 kg)  Height: 5' 3 (1.6 m)   Body mass index is 18.42 kg/m.     09/22/2024   10:16 AM 05/15/2024    5:03 PM 05/15/2024    1:03 AM 08/25/2022    9:03 AM 08/09/2019   10:52 AM 03/09/2017    2:45 PM 01/22/2017    5:01 PM  Advanced Directives  Does Patient Have a Medical Advance Directive? No  No No Yes No  No   Type of Advance Directive     Healthcare Power of Attorney    Does patient want to make changes to medical advance directive?     No - Patient declined     Copy of Healthcare Power  of Attorney in Chart?     No - copy requested     Would patient like information on creating a medical advance directive? No - Patient declined No - Patient declined  No - Patient declined   No - Patient declined      Data saved with a previous flowsheet row definition    Current Medications (verified) Outpatient Encounter Medications as of 09/22/2024  Medication Sig   aspirin  EC 81 MG tablet Take 81 mg by mouth daily. Swallow whole.   B Complex-C (B-COMPLEX WITH VITAMIN C) tablet Take 1 tablet by mouth daily.   Cholecalciferol (VITAMIN D3) 50 MCG (2000 UT) capsule Take 1 capsule (2,000 Units total) by mouth daily.   donepezil  (ARICEPT ) 10 MG tablet Take 1 tablet (10 mg total) by mouth at bedtime.   hydroxyurea  (HYDREA ) 500 MG capsule Take 1 capsule (500 mg total) by mouth as directed. May take with food to minimize GI side effects. Take 1 pill daily Monday Through Friday, do not take on Saturday/Sunday   amoxicillin -clavulanate (AUGMENTIN ) 875-125 MG tablet Take 1 tablet by mouth 2 (two) times daily. (Patient not taking: Reported on 09/22/2024)   fluticasone  (FLONASE ) 50 MCG/ACT nasal spray Place 2 sprays into both nostrils daily. (Patient not taking: Reported on 09/22/2024)   No facility-administered encounter medications on file as of 09/22/2024.    Allergies (verified) Mold extract [trichophyton] and Pollen extract   History: Past Medical History:  Diagnosis Date   Arthritis  right hand (01/22/2017)   Gallstones    History of kidney stones    Hypertension    Migraine    a few/year (01/22/2017)   Pneumonia X 1   Past Surgical History:  Procedure Laterality Date   CRANIOTOMY N/A 10/16/2016   Procedure: Pituitary tumor (benign) resection - CRANIOTOMY HYPOPHYSECTOMY TRANSNASAL APPROACH;  Surgeon: Gerldine Maizes, MD;  Location: [facility] OR;  Service: Neurosurgery;  Laterality: N/A;   ELECTROPHYSIOLOGIC STUDY N/A 01/22/2017   Procedure: SVT Ablation;  Surgeon: Will Gladis Norton,  MD;  Location: [facility] INVASIVE CV LAB;  Service: Cardiovascular;  Laterality: N/A;   ELECTROPHYSIOLOGIC STUDY N/A 01/22/2017   Procedure: Electrophysiology Study;  Surgeon: Will Gladis Norton, MD;  Location: [facility] INVASIVE CV LAB;  Service: Cardiovascular;  Laterality: N/A;   gallstones  ~ 2000   KIDNEY STONE SURGERY     cut me open   SUPRAVENTRICULAR TACHYCARDIA ABLATION  01/22/2017   TRANSNASAL APPROACH N/A 10/16/2016   Procedure: TRANSNASAL APPROACH WITH FUSION;  Surgeon: Alm Bouche, MD;  Location: [facility];  Service: ENT;  Laterality: N/A;   TUBAL LIGATION     Family History  Problem Relation Age of Onset   Hypertension Mother    Cancer Father        lung   Hypertension Sister    Hypertension Brother    Social History   Socioeconomic History   Marital status: Widowed    Spouse name: Lynwood   Number of children: 5   Years of education: 10   Highest education level: Not on file  Occupational History    Employer: [employer]    Comment: [employer]  Tobacco Use   Smoking status: Former    Current packs/day: 0.00    Types: Cigarettes    Quit date: 10/16/2016    Years since quitting: 7.9   Smokeless tobacco: Never  Vaping Use   Vaping status: Never Used  Substance and Sexual Activity   Alcohol use: No   Drug use: No   Sexual activity: Not Currently  Other Topics Concern   Not on file  Social History Narrative   Patient is married(James) and lives at home with her husband.   Patient has 5 children.   Patient works full-time.   Patient has a 10th  Grade education.   Patient is right handed.   Patient drinks 2-3 cups of coffee daily.   Social Drivers of Corporate investment banker Strain: Low Risk  (09/22/2024)   Overall Financial Resource Strain (CARDIA)    Difficulty of Paying Living Expenses: Not hard at all  Food Insecurity: No Food Insecurity (09/22/2024)   Hunger Vital Sign    Worried About Running Out of Food in the Last Year: Never  true    Ran Out of Food in the Last Year: Never true  Transportation Needs: No Transportation Needs (09/22/2024)   PRAPARE - Administrator, Civil Service (Medical): No    Lack of Transportation (Non-Medical): No  Physical Activity: Inactive (09/22/2024)   Exercise Vital Sign    Days of Exercise per Week: 0 days    Minutes of Exercise per Session: 0 min  Stress: No Stress Concern Present (09/22/2024)   [employer] of Occupational Health - Occupational Stress Questionnaire    Feeling of Stress: Not at all  Social Connections: Moderately Integrated (09/22/2024)   Social Connection and Isolation Panel    Frequency of Communication with Friends and Family: More than three times a week  Frequency of Social Gatherings with Friends and Family: Twice a week    Attends Religious Services: More than 4 times per year    Active Member of [employer] or Organizations: Yes    Attends Banker Meetings: Never    Marital Status: Widowed    Tobacco Counseling Counseling given: Not Answered    Clinical Intake:  Pre-visit preparation completed: Yes  Pain : No/denies pain     BMI - recorded: 18.42 Nutritional Risks: None Diabetes: No  Lab Results  Component Value Date   HGBA1C 5.3 12/26/2016     How often do you need to have someone help you when you read instructions, pamphlets, or other written materials from your doctor or pharmacy?: 5 - Always (Dtr handles)  Interpreter Needed?: No  Information entered by :: Verdie Saba, CMA   Activities of Daily Living     09/22/2024   10:22 AM 05/15/2024    4:00 PM  In your present state of health, do you have any difficulty performing the following activities:  Hearing? 0 0  Vision? 0 1  Difficulty concentrating or making decisions? 0 1  Walking or climbing stairs? 0   Dressing or bathing? 0   Doing errands, shopping? 1   Comment Dtr Leisure centre manager and eating ? N   Using the Toilet? N   In the past  six months, have you accidently leaked urine? N   Do you have problems with loss of bowel control? N   Managing your Medications? Y   Comment Dtr handles   Managing your Finances? Y   Comment Dtr Psychologist, counselling your Housekeeping? Y   Comment Dtr handles     Patient Care Team: Plotnikov, Karlynn GAILS, MD as PCP - General (Internal Medicine)  I have updated your Care Teams any recent Medical Services you may have received from other providers in the past year.     Assessment:   This is a routine wellness examination for Verania.  Hearing/Vision screen Hearing Screening - Comments:: Denies hearing difficulties   Vision Screening - Comments:: Wears eyeglasses - up to date with routine eye exams with an Optometrist   Goals Addressed               This Visit's Progress     Patient Stated (pt-stated)        Patient stated that her goal is to continue to walk and increase eating       Depression Screen     09/22/2024   10:25 AM 09/14/2023    1:50 PM 08/14/2022   10:05 AM 11/13/2021    8:35 AM 09/09/2020    3:10 PM 03/09/2017    2:45 PM  PHQ 2/9 Scores  PHQ - 2 Score 0 1 1 0 1 0  PHQ- 9 Score 0   3    Exception Documentation      Patient refusal    Fall Risk     09/22/2024   10:24 AM 09/14/2023    1:50 PM 11/10/2022    3:43 PM 08/14/2022   10:04 AM 11/13/2021    8:35 AM  Fall Risk   Falls in the past year? 0 0 0 0 0  Number falls in past yr: 0 0 0 0 0  Injury with Fall? 0 0 0 0 0  Risk for fall due to : No Fall Risks No Fall Risks No Fall Risks No Fall Risks No Fall Risks  Follow up Falls evaluation completed;Falls prevention discussed Falls evaluation completed Falls evaluation completed  Falls evaluation completed       Data saved with a previous flowsheet row definition    MEDICARE RISK AT HOME:  Medicare Risk at Home Any stairs in or around the home?: Yes If so, are there any without handrails?: No Home free of loose throw rugs in walkways,  pet beds, electrical cords, etc?: Yes Adequate lighting in your home to reduce risk of falls?: Yes Life alert?: No Use of a cane, walker or w/c?: No Grab bars in the bathroom?: No Shower chair or bench in shower?: No Elevated toilet seat or a handicapped toilet?: No  TIMED UP AND GO:  Was the test performed?  No  Cognitive Function: Impaired: Patient has current diagnosis of cognitive impairment.    09/22/2024   10:36 AM  MMSE - Mini Mental State Exam  Not completed: Unable to complete        Immunizations Immunization History  Administered Date(s) Administered   Fluad Quad(high Dose 65+) 10/07/2019, 09/09/2020, 11/13/2021, 11/10/2022   INFLUENZA, HIGH DOSE SEASONAL PF 01/07/2018, 10/06/2018, 10/06/2018, 09/05/2024   PFIZER(Purple Top)SARS-COV-2 Vaccination 04/05/2020   Pneumococcal Conjugate-13 06/09/2017   Pneumococcal Polysaccharide-23 08/02/2019   Td 03/19/2008   Tdap 07/08/2018   Zoster Recombinant(Shingrix) 06/09/2017, 01/11/2018    Screening Tests Health Maintenance  Topic Date Due   Hepatitis C Screening  Never done   COVID-19 Vaccine (2 - Pfizer risk series) 04/26/2020   Mammogram  05/18/2021   Fecal DNA (Cologuard)  09/22/2025 (Originally 08/31/2021)   Medicare Annual Wellness (AWV)  09/22/2025   DTaP/Tdap/Td (3 - Td or Tdap) 07/08/2028   Pneumococcal Vaccine: 50+ Years  Completed   Influenza Vaccine  Completed   DEXA SCAN  Completed   Zoster Vaccines- Shingrix  Completed   Meningococcal B Vaccine  Aged Out    Health Maintenance Items Addressed: Mammogram ordered today.  I have recommended that this patient have a Cologuard but she declines at this time. I have discussed the risks and benefits of this procedure with her. The patient verbalizes understanding.   Additional Screening:  Vision Screening: Recommended annual ophthalmology exams for early detection of glaucoma and other disorders of the eye. Is the patient up to date with their annual eye  exam?  No  Who is the provider or what is the name of the office in which the patient attends annual eye exams? Last eye exam as 64yr ago - unable to recall the name of Optometrist  Dental Screening: Recommended annual dental exams for proper oral hygiene  Community Resource Referral / Chronic Care Management: CRR required this visit?  No   CCM required this visit?  No   Plan:    I have personally reviewed and noted the following in the patient's chart:   Medical and social history Use of alcohol, tobacco or illicit drugs  Current medications and supplements including opioid prescriptions. Patient is not currently taking opioid prescriptions. Functional ability and status Nutritional status Physical activity Advanced directives List of other physicians Hospitalizations, surgeries, and ER visits in previous 12 months Vitals Screenings to include cognitive, depression, and falls Referrals and appointments  In addition, I have reviewed and discussed with patient certain preventive protocols, quality metrics, and best practice recommendations. A written personalized care plan for preventive services as well as general preventive health recommendations were provided to patient.   Verdie CHRISTELLA Saba, CMA   10/02/2024   After Visit Summary: (MyChart)  Due to this being a telephonic visit, the after visit summary with patients personalized plan was offered to patient via MyChart   Notes: Scheduled a 6-mth f/u appt w/PCP for 11/2024.  Medical screening examination/treatment/procedure(s) were performed by non-physician practitioner and as supervising physician I was immediately available for consultation/collaboration.  I agree with above. Karlynn Noel, MD

## 2024-09-22 NOTE — Patient Instructions (Addendum)
 Ms. Troyer,  Thank you for taking the time for your Medicare Wellness Visit. I appreciate your continued commitment to your health goals. Please review the care plan we discussed, and feel free to reach out if I can assist you further.  Medicare recommends these wellness visits once per year to help you and your care team stay ahead of potential health issues. These visits are designed to focus on prevention, allowing your provider to concentrate on managing your acute and chronic conditions during your regular appointments.  Please note that Annual Wellness Visits do not include a physical exam. Some assessments may be limited, especially if the visit was conducted virtually. If needed, we may recommend a separate in-person follow-up with your provider.  Ongoing Care Seeing your primary care provider every 3 to 6 months helps us  monitor your health and provide consistent, personalized care.   Referrals If a referral was made during today's visit and you haven't received any updates within two weeks, please contact the referred provider directly to check on the status.  Recommended Screenings:  Health Maintenance  Topic Date Due   Hepatitis C Screening  Never done   COVID-19 Vaccine (2 - Pfizer risk series) 04/26/2020   Breast Cancer Screening  05/18/2021   Cologuard (Stool DNA test)  09/22/2025*   Medicare Annual Wellness Visit  09/22/2025   DTaP/Tdap/Td vaccine (3 - Td or Tdap) 07/08/2028   Pneumococcal Vaccine for age over 24  Completed   Flu Shot  Completed   DEXA scan (bone density measurement)  Completed   Zoster (Shingles) Vaccine  Completed   HPV Vaccine  Aged Out   Meningitis B Vaccine  Aged Out  *Topic was postponed. The date shown is not the original due date.       09/22/2024   10:16 AM  Advanced Directives  Does Patient Have a Medical Advance Directive? No  Would patient like information on creating a medical advance directive? No - Patient declined   Advance Care  Planning is important because it: Ensures you receive medical care that aligns with your values, goals, and preferences. Provides guidance to your family and loved ones, reducing the emotional burden of decision-making during critical moments.  Vision: Annual vision screenings are recommended for early detection of glaucoma, cataracts, and diabetic retinopathy. These exams can also reveal signs of chronic conditions such as diabetes and high blood pressure.  Dental: Annual dental screenings help detect early signs of oral cancer, gum disease, and other conditions linked to overall health, including heart disease and diabetes.

## 2024-10-03 ENCOUNTER — Ambulatory Visit

## 2024-10-23 ENCOUNTER — Encounter: Payer: Self-pay | Admitting: Internal Medicine

## 2024-10-26 ENCOUNTER — Other Ambulatory Visit: Payer: Self-pay | Admitting: Internal Medicine

## 2024-10-26 DIAGNOSIS — M25551 Pain in right hip: Secondary | ICD-10-CM

## 2024-10-28 ENCOUNTER — Encounter: Payer: Self-pay | Admitting: Internal Medicine

## 2024-11-02 ENCOUNTER — Encounter: Payer: Self-pay | Admitting: Internal Medicine

## 2024-11-02 NOTE — Progress Notes (Unsigned)
 Subjective:    Patient ID: Janet Sherman, female    DOB: 03-Apr-1951, 73 y.o.   MRN: [id number]      HPI Janet Sherman is here for No chief complaint on file.  She is here with her daughter who provides good portion of the history.  Discussed the use of AI scribe software for clinical note transcription with the patient, who gave verbal consent to proceed.  History of Present Illness Janet Sherman is a 73 year old female who presents with left hip pain and a persistent cough.  She has been experiencing a persistent cough for the past three to four weeks. The cough is productive with mucus, particularly noticeable at night. No fever, change in appetite, or wheezing, but there is occasional shortness of breath and nasal congestion. No sore throat, ear pain, or sinus pain. The cough initially started with cold-like symptoms.  She reports left hip pain that began approximately two weeks ago. She has been taking Tylenol  for arthritis, which has provided some relief, but she continues to experience discomfort and a limp. There is no history of falls or prior hip issues, although she has had shoulder problems in the past. The pain does not radiate down the leg, and there is no associated numbness or tingling. Her daughter mentions that she tends to lie on the edge of the bed, which might contribute to the discomfort. No lower back pain or knee pain. She has not had any prior x-rays of the hip.  She also states some right hip pain but is not as bad as the left hip.  She is a poor historian due to dementia      Medications and allergies reviewed with patient and updated if appropriate.  Current Outpatient Medications on File Prior to Visit  Medication Sig Dispense Refill   aspirin  EC 81 MG tablet Take 81 mg by mouth daily. Swallow whole.     B Complex-C (B-COMPLEX WITH VITAMIN C) tablet Take 1 tablet by mouth daily. 100 tablet 3   Cholecalciferol (VITAMIN D3) 50 MCG (2000 UT) capsule Take 1 capsule  (2,000 Units total) by mouth daily. 100 capsule 3   donepezil  (ARICEPT ) 10 MG tablet Take 1 tablet (10 mg total) by mouth at bedtime. 90 tablet 3   hydroxyurea  (HYDREA ) 500 MG capsule Take 1 capsule (500 mg total) by mouth as directed. May take with food to minimize GI side effects. Take 1 pill daily Monday Through Friday, do not take on Saturday/Sunday 90 capsule 1   fluticasone  (FLONASE ) 50 MCG/ACT nasal spray Place 2 sprays into both nostrils daily. (Patient not taking: Reported on 11/03/2024) 16 g 10   No current facility-administered medications on file prior to visit.    Review of Systems  Constitutional:  Negative for appetite change and fever.  HENT:  Positive for congestion. Negative for ear pain, sinus pain and sore throat.   Respiratory:  Positive for cough (sounds wet, productive at times). Negative for shortness of breath and wheezing.   Neurological:  Positive for dizziness (pcc) and headaches (occ).       Objective:   Vitals:   11/03/24 0752  BP: 128/72  Pulse: 64  Temp: 97.8 F (36.6 C)  SpO2: 92%   BP Readings from Last 3 Encounters:  11/03/24 128/72  09/05/24 124/86  08/01/24 110/73   Wt Readings from Last 3 Encounters:  11/03/24 109 lb (49.4 kg)  09/22/24 104 lb (47.2 kg)  09/05/24 104 lb 6.4 oz (47.4 kg)  Body mass index is 19.31 kg/m.    Physical Exam Constitutional:      General: She is not in acute distress.    Appearance: Normal appearance. She is not ill-appearing.  HENT:     Head: Normocephalic and atraumatic.     Right Ear: Tympanic membrane, ear canal and external ear normal.     Left Ear: Tympanic membrane, ear canal and external ear normal.     Mouth/Throat:     Mouth: Mucous membranes are moist.     Pharynx: No oropharyngeal exudate or posterior oropharyngeal erythema.  Eyes:     Conjunctiva/sclera: Conjunctivae normal.  Cardiovascular:     Rate and Rhythm: Normal rate and regular rhythm.  Pulmonary:     Effort: Pulmonary effort  is normal. No respiratory distress.     Breath sounds: Rhonchi (Rhonchi intermittently with coughing) present. No wheezing.  Musculoskeletal:        General: Tenderness (Tenderness with palpation of lateral aspect of left hip.  Pain with changing position and movement of the left leg.) present.     Cervical back: Neck supple. No tenderness.     Right lower leg: No edema.     Left lower leg: No edema.  Lymphadenopathy:     Cervical: No cervical adenopathy.  Skin:    General: Skin is warm and dry.  Neurological:     Mental Status: She is alert.     Gait: Gait abnormal (Walking with a limp because of left hip pain).            Assessment & Plan:   Assessment and Plan Assessment & Plan Pain in left hip, possible right hip arthritis Possible causes osteoarthritis, bursitis, radiculopathy Has had pain for 2 weeks, no known injury She does have tenderness on exam.  Tylenol  provides partial relief. - Ordered x-rays of pelvis and hips to assess for arthritis or structural issues. - Continue Tylenol  for pain management. - Referred to sports medicine for further evaluation  Cough with sputum, possible bronchitis Cough for three to four weeks, wet with mucus. Possible bronchitis.  - Prescribed Omnicef  twice daily x 10 days  - Advised monitoring for improvement in cough and mucus reduction. - Instructed to report if symptoms worsen or do not improve.

## 2024-11-03 ENCOUNTER — Ambulatory Visit (INDEPENDENT_AMBULATORY_CARE_PROVIDER_SITE_OTHER)

## 2024-11-03 ENCOUNTER — Ambulatory Visit: Admitting: Internal Medicine

## 2024-11-03 VITALS — BP 128/72 | HR 64 | Temp 97.8°F | Ht 63.0 in | Wt 109.0 lb

## 2024-11-03 DIAGNOSIS — J209 Acute bronchitis, unspecified: Secondary | ICD-10-CM | POA: Diagnosis not present

## 2024-11-03 DIAGNOSIS — M25551 Pain in right hip: Secondary | ICD-10-CM | POA: Diagnosis not present

## 2024-11-03 DIAGNOSIS — M25552 Pain in left hip: Secondary | ICD-10-CM

## 2024-11-03 MED ORDER — CEFDINIR 300 MG PO CAPS: 300.0000 mg | ORAL_CAPSULE | Freq: Two times a day (BID) | ORAL | 0 refills | Status: DC

## 2024-11-03 NOTE — Patient Instructions (Addendum)
      Xrays downstairs      Medications changes include :   omnicef  for cough    A referral was ordered sports medicine and someone will call you to schedule an appointment.

## 2024-11-08 ENCOUNTER — Ambulatory Visit: Payer: Self-pay | Admitting: Internal Medicine

## 2024-11-14 ENCOUNTER — Other Ambulatory Visit: Payer: Self-pay

## 2024-11-14 ENCOUNTER — Ambulatory Visit

## 2024-11-14 ENCOUNTER — Emergency Department (HOSPITAL_COMMUNITY)

## 2024-11-14 ENCOUNTER — Encounter (HOSPITAL_COMMUNITY): Payer: Self-pay

## 2024-11-14 ENCOUNTER — Ambulatory Visit: Admitting: Family Medicine

## 2024-11-14 ENCOUNTER — Emergency Department (HOSPITAL_COMMUNITY)
Admission: EM | Admit: 2024-11-14 | Discharge: 2024-11-15 | Disposition: A | Discharge status: Home / Self Care | Attending: Emergency Medicine | Admitting: Emergency Medicine

## 2024-11-14 DIAGNOSIS — G8929 Other chronic pain: Secondary | ICD-10-CM | POA: Diagnosis not present

## 2024-11-14 DIAGNOSIS — G43409 Hemiplegic migraine, not intractable, without status migrainosus: Secondary | ICD-10-CM | POA: Diagnosis not present

## 2024-11-14 DIAGNOSIS — F17210 Nicotine dependence, cigarettes, uncomplicated: Secondary | ICD-10-CM | POA: Insufficient documentation

## 2024-11-14 DIAGNOSIS — R29818 Other symptoms and signs involving the nervous system: Secondary | ICD-10-CM | POA: Diagnosis not present

## 2024-11-14 DIAGNOSIS — Z7982 Long term (current) use of aspirin: Secondary | ICD-10-CM | POA: Diagnosis not present

## 2024-11-14 DIAGNOSIS — R202 Paresthesia of skin: Secondary | ICD-10-CM | POA: Insufficient documentation

## 2024-11-14 DIAGNOSIS — R739 Hyperglycemia, unspecified: Secondary | ICD-10-CM | POA: Diagnosis not present

## 2024-11-14 DIAGNOSIS — F0390 Unspecified dementia without behavioral disturbance: Secondary | ICD-10-CM | POA: Diagnosis not present

## 2024-11-14 DIAGNOSIS — Z79899 Other long term (current) drug therapy: Secondary | ICD-10-CM | POA: Diagnosis not present

## 2024-11-14 DIAGNOSIS — I10 Essential (primary) hypertension: Secondary | ICD-10-CM | POA: Diagnosis not present

## 2024-11-14 DIAGNOSIS — M79601 Pain in right arm: Secondary | ICD-10-CM | POA: Insufficient documentation

## 2024-11-14 DIAGNOSIS — M25511 Pain in right shoulder: Secondary | ICD-10-CM | POA: Insufficient documentation

## 2024-11-14 DIAGNOSIS — R251 Tremor, unspecified: Secondary | ICD-10-CM | POA: Insufficient documentation

## 2024-11-14 DIAGNOSIS — R519 Headache, unspecified: Secondary | ICD-10-CM | POA: Insufficient documentation

## 2024-11-14 LAB — DIFFERENTIAL
Abs Immature Granulocytes: 0.05 K/uL (ref 0.00–0.07)
Basophils Absolute: 0.1 K/uL (ref 0.0–0.1)
Basophils Relative: 1 %
Eosinophils Absolute: 0.2 K/uL (ref 0.0–0.5)
Eosinophils Relative: 2 %
Immature Granulocytes: 1 %
Lymphocytes Relative: 9 %
Lymphs Abs: 0.9 K/uL (ref 0.7–4.0)
Monocytes Absolute: 0.7 K/uL (ref 0.1–1.0)
Monocytes Relative: 7 %
Neutro Abs: 8.3 K/uL — ABNORMAL HIGH (ref 1.7–7.7)
Neutrophils Relative %: 80 %

## 2024-11-14 LAB — CBG MONITORING, ED: Glucose-Capillary: 113 mg/dL — ABNORMAL HIGH (ref 70–99)

## 2024-11-14 LAB — CBC
HCT: 42.8 % (ref 36.0–46.0)
Hemoglobin: 13.3 g/dL (ref 12.0–15.0)
MCH: 28.5 pg (ref 26.0–34.0)
MCHC: 31.1 g/dL (ref 30.0–36.0)
MCV: 91.6 fL (ref 80.0–100.0)
Platelets: 495 K/uL — ABNORMAL HIGH (ref 150–400)
RBC: 4.67 MIL/uL (ref 3.87–5.11)
RDW: 13.9 % (ref 11.5–15.5)
WBC: 10.2 K/uL (ref 4.0–10.5)
nRBC: 0 % (ref 0.0–0.2)

## 2024-11-14 LAB — I-STAT CHEM 8, ED
BUN: 20 mg/dL (ref 8–23)
Calcium, Ion: 1.03 mmol/L — ABNORMAL LOW (ref 1.15–1.40)
Chloride: 107 mmol/L (ref 98–111)
Creatinine, Ser: 1.3 mg/dL — ABNORMAL HIGH (ref 0.44–1.00)
Glucose, Bld: 111 mg/dL — ABNORMAL HIGH (ref 70–99)
HCT: 39 % (ref 36.0–46.0)
Hemoglobin: 13.3 g/dL (ref 12.0–15.0)
Potassium: 3.4 mmol/L — ABNORMAL LOW (ref 3.5–5.1)
Sodium: 139 mmol/L (ref 135–145)
TCO2: 23 mmol/L (ref 22–32)

## 2024-11-14 LAB — APTT: aPTT: 39 s — ABNORMAL HIGH (ref 24–36)

## 2024-11-14 LAB — COMPREHENSIVE METABOLIC PANEL WITH GFR
ALT: 18 U/L (ref 0–44)
AST: 22 U/L (ref 15–41)
Albumin: 3.5 g/dL (ref 3.5–5.0)
Alkaline Phosphatase: 48 U/L (ref 38–126)
Anion gap: 14 (ref 5–15)
BUN: 16 mg/dL (ref 8–23)
CO2: 24 mmol/L (ref 22–32)
Calcium: 9.6 mg/dL (ref 8.9–10.3)
Chloride: 103 mmol/L (ref 98–111)
Creatinine, Ser: 0.93 mg/dL (ref 0.44–1.00)
GFR, Estimated: >60 mL/min (ref >60)
Glucose, Bld: 108 mg/dL — ABNORMAL HIGH (ref 70–99)
Potassium: 3.5 mmol/L (ref 3.5–5.1)
Sodium: 141 mmol/L (ref 135–145)
Total Bilirubin: 0.9 mg/dL (ref 0.0–1.2)
Total Protein: 6.9 g/dL (ref 6.5–8.1)

## 2024-11-14 LAB — PROTIME-INR
INR: 1.3 — ABNORMAL HIGH (ref 0.8–1.2)
Prothrombin Time: 17 s — ABNORMAL HIGH (ref 11.4–15.2)

## 2024-11-14 LAB — ETHANOL: Alcohol, Ethyl (B): <15 mg/dL (ref <15)

## 2024-11-14 MED ORDER — ACETAMINOPHEN 325 MG PO TABS
650.0000 mg | ORAL_TABLET | Freq: Once | ORAL | Status: AC
  Administered 2024-11-14: 650 mg via ORAL
  Filled 2024-11-14: qty 2

## 2024-11-14 MED ORDER — LIDOCAINE 5 % EX PTCH
1.0000 | MEDICATED_PATCH | CUTANEOUS | Status: DC
  Administered 2024-11-15: 1 via TRANSDERMAL
  Filled 2024-11-14: qty 1

## 2024-11-14 NOTE — ED Provider Notes (Signed)
 [place] EMERGENCY DEPARTMENT AT [facility] Provider Note   CSN: [id number] Arrival date & time: 11/14/24  2149  An emergency department physician performed an initial assessment on this suspected stroke patient at 2110.  Patient presents with: Code Stroke   Janet Sherman is a 73 y.o. female with PMHx OA, HTN, migraines, chronic right shoulder pain, dementia who presents to ED concerned for headache and right shoulder pain that started around 8PM. Patient unsure what she was doing prior to pain starting. Family member at bedside stating that the right arm pain is chronic for patient, but they were concerned today because this right arm started shaking.  {Add pertinent medical, surgical, social history, OB history to HPI:32947} HPI     Prior to Admission medications   Medication Sig Start Date End Date Taking? Authorizing Provider  aspirin  EC 81 MG tablet Take 81 mg by mouth daily. Swallow whole.    [provider]  B Complex-C (B-COMPLEX WITH VITAMIN C) tablet Take 1 tablet by mouth daily. 09/05/24   Plotnikov, Aleksei V, MD  cefdinir  (OMNICEF ) 300 MG capsule Take 1 capsule (300 mg total) by mouth 2 (two) times daily. 11/03/24   Geofm Glade PARAS, MD  Cholecalciferol (VITAMIN D3) 50 MCG (2000 UT) capsule Take 1 capsule (2,000 Units total) by mouth daily. 09/05/24   Plotnikov, Karlynn GAILS, MD  donepezil  (ARICEPT ) 10 MG tablet Take 1 tablet (10 mg total) by mouth at bedtime. 09/14/23   Plotnikov, Karlynn GAILS, MD  fluticasone  (FLONASE ) 50 MCG/ACT nasal spray Place 2 sprays into both nostrils daily. Patient not taking: Reported on 11/03/2024 08/01/24 08/31/24  Karis Clunes, MD  hydroxyurea  (HYDREA ) 500 MG capsule Take 1 capsule (500 mg total) by mouth as directed. May take with food to minimize GI side effects. Take 1 pill daily Monday Through Friday, do not take on Saturday/Sunday 01/06/24   Dorsey, John T IV, MD    Allergies: Mold extract [trichophyton] and Pollen extract    Review of  Systems  Updated Vital Signs BP 128/78   Pulse 69   Temp 98.1 F (36.7 C)   Resp (!) 22   Ht 5' 3 (1.6 m)   Wt 49.4 kg   SpO2 97%   BMI 19.31 kg/m   Physical Exam Vitals and nursing note reviewed.  Constitutional:      General: She is not in acute distress.    Appearance: She is not ill-appearing or toxic-appearing.  HENT:     Head: Normocephalic and atraumatic.     Mouth/Throat:     Mouth: Mucous membranes are moist.  Eyes:     General: No scleral icterus.       Right eye: No discharge.        Left eye: No discharge.     Conjunctiva/sclera: Conjunctivae normal.  Cardiovascular:     Rate and Rhythm: Normal rate and regular rhythm.     Pulses: Normal pulses.     Heart sounds: Normal heart sounds. No murmur heard. Pulmonary:     Effort: Pulmonary effort is normal. No respiratory distress.     Breath sounds: Normal breath sounds. No wheezing, rhonchi or rales.  Abdominal:     General: Abdomen is flat. Bowel sounds are normal. There is no distension.     Palpations: Abdomen is soft. There is no mass.     Tenderness: There is no abdominal tenderness.  Musculoskeletal:     Right lower leg: No edema.     Left lower  leg: No edema.  Skin:    General: Skin is warm and dry.     Findings: No rash.  Neurological:     General: No focal deficit present.     Mental Status: She is alert. Mental status is at baseline.     Comments: GCS 15. Speech is goal oriented. No deficits appreciated to CN III-XII; symmetric eyebrow raise, no facial drooping, tongue midline. Patient has equal grip strength bilaterally with 5/5 strength against resistance in all major muscle groups bilaterally. Sensation to light touch intact. Patient moves extremities without ataxia.   Psychiatric:        Mood and Affect: Mood normal.     (all labs ordered are listed, but only abnormal results are displayed) Labs Reviewed  PROTIME-INR - Abnormal; Notable for the following components:      Result Value    Prothrombin Time 17.0 (*)    INR 1.3 (*)    All other components within normal limits  APTT - Abnormal; Notable for the following components:   aPTT 39 (*)    All other components within normal limits  CBC - Abnormal; Notable for the following components:   Platelets 495 (*)    All other components within normal limits  DIFFERENTIAL - Abnormal; Notable for the following components:   Neutro Abs 8.3 (*)    All other components within normal limits  COMPREHENSIVE METABOLIC PANEL WITH GFR - Abnormal; Notable for the following components:   Glucose, Bld 108 (*)    All other components within normal limits  I-STAT CHEM 8, ED - Abnormal; Notable for the following components:   Potassium 3.4 (*)    Creatinine, Ser 1.30 (*)    Glucose, Bld 111 (*)    Calcium , Ion 1.03 (*)    All other components within normal limits  CBG MONITORING, ED - Abnormal; Notable for the following components:   Glucose-Capillary 113 (*)    All other components within normal limits  ETHANOL  RAPID URINE DRUG SCREEN, HOSP PERFORMED  URINALYSIS, ROUTINE W REFLEX MICROSCOPIC    EKG: EKG Interpretation Date/Time:  Tuesday November 14 2024 22:46:07 EST Ventricular Rate:  66 PR Interval:  160 QRS Duration:  89 QT Interval:  471 QTC Calculation: 494 R Axis:   -44  Text Interpretation: Sinus rhythm Left axis deviation Anteroseptal infarct, age indeterminate Confirmed by Darra Chew [id number]) on 11/14/2024 10:54:57 PM  Radiology: CT HEAD CODE STROKE WO CONTRAST (LKW 0-4.5h, LVO 0-24h) Result Date: 11/14/2024 EXAM: CT HEAD WITHOUT CONTRAST 11/14/2024 10:19:43 PM TECHNIQUE: CT of the head was performed without the administration of intravenous contrast. Automated exposure control, iterative reconstruction, and/or weight based adjustment of the mA/kV was utilized to reduce the radiation dose to as low as reasonably achievable. COMPARISON: CT head 05/15/2024 MRI 05/15/2024 CLINICAL HISTORY: Neuro deficit, acute, stroke  suspected FINDINGS: BRAIN AND VENTRICLES: No acute hemorrhage. No evidence of acute infarct. Small remote left caudate lacunar infarct. Unchanged left thalamus changes characterized on prior MRI. No hydrocephalus. No extra-axial collection. No mass effect or midline shift. ORBITS: No acute abnormality. SINUSES: Chronic severe pansinusitis. SOFT TISSUES AND SKULL: No skull fracture. Findings conveyed to Dr. Vanessa via pager at 10:30 PM. IMPRESSION: 1. No acute intracranial abnormality. ASPECTS 10. 2. Remote lacunar infarcts as above. 3. Chronic severe pansinusitis. Electronically signed by: Gilmore Molt MD 11/14/2024 10:31 PM EST RP Workstation: [id number]    {Document cardiac monitor, telemetry assessment procedure when appropriate:32947} Procedures   Medications Ordered in the ED  lidocaine  (LIDODERM ) 5 % 1 patch (has no administration in time range)  acetaminophen  (TYLENOL ) tablet 650 mg (650 mg Oral Given 11/14/24 2316)      {Click here for ABCD2, HEART and other calculators REFRESH Note before signing:1}                              Medical Decision Making Amount and/or Complexity of Data Reviewed Labs: ordered. Radiology: ordered.  Risk OTC drugs. Prescription drug management.   ***  {Document critical care time when appropriate  Document review of labs and clinical decision tools ie CHADS2VASC2, etc  Document your independent review of radiology images and any outside records  Document your discussion with family members, caretakers and with consultants  Document social determinants of health affecting pt's care  Document your decision making why or why not admission, treatments were needed:32947:::1}   Final diagnoses:  None    ED Discharge Orders     None

## 2024-11-14 NOTE — ED Triage Notes (Signed)
 Pt family reports that pt was noted to be normal at 8pm on 11/18. Pt was found to have a headache on the left side of her head at 930. Pt had shaking in her right arm.

## 2024-11-14 NOTE — ED Provider Triage Note (Signed)
 Emergency Medicine Provider Triage Evaluation Note  Janet Sherman , a 73 y.o. female  was evaluated in triage.  Pt complains of complains of right arm weakness, tremor, decrased sensation and decreased sensation to right leg, previously normal. LNK 8pm when she went to bed. Woke up family member at 930pm with HA and these symptoms.   Review of Systems  Positive: HA Negative: fever  Physical Exam  BP (!) 150/89 (BP Location: Left Arm)   Pulse 84   Temp 98.1 F (36.7 C)   Resp 15   SpO2 90%  Gen:   Awake, no distress   Resp:  Normal effort  MSK:   Moves extremities without difficulty  Other:  No slurred speech, pupils equal and reactive.  No slurred speech.  Decrease sensation to right arm and right leg reported on exam.  Patient reports her right arm feels heavier than normal left and seems to have decreased right sided grip strength.  Also has constant right arm tremor on exam.  Medical Decision Making  Medically screening exam initiated at 10:03 PM.  Appropriate orders placed.  Janet Sherman was informed that the remainder of the evaluation will be completed by another provider, this initial triage assessment does not replace that evaluation, and the importance of remaining in the ED until their evaluation is complete.     Shermon Warren SAILOR, PA-C 11/14/24 2205

## 2024-11-14 NOTE — Consult Note (Signed)
 NEUROLOGY CONSULT NOTE   Date of service: November 14, 2024 Patient Name: Janet Sherman MRN:  [id number] DOB:  11/03/1951 Chief Complaint: R sied pain and shaking, code stroke Requesting Provider: Darra Fonda MATSU, MD  History of Present Illness  Janet Sherman is a 73 y.o. female with hx of HTN, Migraine, nephrolithiasis who presents with headache and R sided tingling and pain.  Symptoms started around 2000.  Had similar episode back in 2014 and then more recently in May of 2025 with negative extensive workup.  Reports that daughter brought her to the ED today for further evaluation and workup.   LKW: 2000 Modified rankin score: 2-Slight disability-UNABLE to perform all activities but does not need assistance IV Thrombolysis: not offered, NIHSS of 0, low suspicion for stroke. EVT: not offered, NIHSS of 0 and low suspicion for stroke   NIHSS components Score: Comment  1a Level of Conscious 0[]  1[]  2[]  3[]      1b LOC Questions 0[]  1[]  2[x]     Thinks she is 53 and unsure of the month  1c LOC Commands 0[]  1[]  2[]       2 Best Gaze 0[]  1[]  2[]       3 Visual 0[]  1[]  2[]  3[]      4 Facial Palsy 0[]  1[]  2[]  3[]      5a Motor Arm - left 0[]  1[]  2[]  3[]  4[]  UN[]    5b Motor Arm - Right 0[]  1[]  2[]  3[]  4[]  UN[]    6a Motor Leg - Left 0[]  1[]  2[]  3[]  4[]  UN[]    6b Motor Leg - Right 0[]  1[]  2[]  3[]  4[]  UN[]    7 Limb Ataxia 0[]  1[]  2[]  UN[]      8 Sensory 0[]  1[]  2[]  UN[]      9 Best Language 0[]  1[]  2[]  3[]      10 Dysarthria 0[]  1[]  2[]  UN[]      11 Extinct. and Inattention 0[]  1[]  2[]       TOTAL: 2      ROS  Comprehensive ROS performed and pertinent positives documented in HPI   Past History   Past Medical History:  Diagnosis Date   Arthritis    right hand (01/22/2017)   Gallstones    History of kidney stones    Hypertension    Migraine    a few/year (01/22/2017)   Pneumonia X 1    Past Surgical History:  Procedure Laterality Date   CRANIOTOMY N/A 10/16/2016   Procedure:  Pituitary tumor (benign) resection - CRANIOTOMY HYPOPHYSECTOMY TRANSNASAL APPROACH;  Surgeon: Gerldine Maizes, MD;  Location: [facility] OR;  Service: Neurosurgery;  Laterality: N/A;   ELECTROPHYSIOLOGIC STUDY N/A 01/22/2017   Procedure: SVT Ablation;  Surgeon: Will Gladis Norton, MD;  Location: [facility] INVASIVE CV LAB;  Service: Cardiovascular;  Laterality: N/A;   ELECTROPHYSIOLOGIC STUDY N/A 01/22/2017   Procedure: Electrophysiology Study;  Surgeon: Will Gladis Norton, MD;  Location: [facility] INVASIVE CV LAB;  Service: Cardiovascular;  Laterality: N/A;   gallstones  ~ 2000   KIDNEY STONE SURGERY     cut me open   SUPRAVENTRICULAR TACHYCARDIA ABLATION  01/22/2017   TRANSNASAL APPROACH N/A 10/16/2016   Procedure: TRANSNASAL APPROACH WITH FUSION;  Surgeon: Alm Bouche, MD;  Location[facility] OR;  Service: ENT;  Laterality: N/A;   TUBAL LIGATION      Family History: Family History  Problem Relation Age of Onset   Hypertension Mother    Cancer Father        lung   Hypertension Sister    Hypertension Brother  Social History  reports that she quit smoking about 8 years ago. Her smoking use included cigarettes. She has never used smokeless tobacco. She reports that she does not drink alcohol and does not use drugs.  Allergies  Allergen Reactions   Mold Extract [Trichophyton] Shortness Of Breath and Other (See Comments)    Wheezing, itchy eyes, and runny nose, also   Pollen Extract Shortness Of Breath and Other (See Comments)    Wheezing, itchy eyes, and runny nose, also    Medications  No current facility-administered medications for this encounter.  Current Outpatient Medications:    aspirin  EC 81 MG tablet, Take 81 mg by mouth daily. Swallow whole., Disp: , Rfl:    B Complex-C (B-COMPLEX WITH VITAMIN C) tablet, Take 1 tablet by mouth daily., Disp: 100 tablet, Rfl: 3   cefdinir  (OMNICEF ) 300 MG capsule, Take 1 capsule (300 mg total) by mouth 2 (two) times daily., Disp: 20 capsule, Rfl: 0    Cholecalciferol (VITAMIN D3) 50 MCG (2000 UT) capsule, Take 1 capsule (2,000 Units total) by mouth daily., Disp: 100 capsule, Rfl: 3   donepezil  (ARICEPT ) 10 MG tablet, Take 1 tablet (10 mg total) by mouth at bedtime., Disp: 90 tablet, Rfl: 3   fluticasone  (FLONASE ) 50 MCG/ACT nasal spray, Place 2 sprays into both nostrils daily. (Patient not taking: Reported on 11/03/2024), Disp: 16 g, Rfl: 10   hydroxyurea  (HYDREA ) 500 MG capsule, Take 1 capsule (500 mg total) by mouth as directed. May take with food to minimize GI side effects. Take 1 pill daily Monday Through Friday, do not take on Saturday/Sunday, Disp: 90 capsule, Rfl: 1  Vitals   Vitals:   11/14/24 2153 11/14/24 2233  BP: (!) 150/89   Pulse: 84   Resp: 15   Temp: 98.1 F (36.7 C)   SpO2: 90%   Weight:  49.4 kg  Height:  5' 3 (1.6 m)    Body mass index is 19.31 kg/m.   Physical Exam   General: Laying comfortably in bed; in no acute distress.  HENT: Normal oropharynx and mucosa. Normal external appearance of ears and nose.  Neck: Supple, no pain or tenderness  CV: No JVD. No peripheral edema.  Pulmonary: Symmetric Chest rise. Normal respiratory effort.  Abdomen: Soft to touch, non-tender.  Ext: No cyanosis, edema, or deformity  Skin: No rash. Normal palpation of skin.   Musculoskeletal: Normal digits and nails by inspection. No clubbing.   Neurologic Examination  Mental status/Cognition: Alert, oriented to self, place, disoriented to month but oriented to year, good attention.  Speech/language: Fluent, comprehension intact, object naming intact, repetition intact.  Cranial nerves:   CN II Pupils equal and reactive to light, no VF deficits    CN III,IV,VI EOM intact, no gaze preference or deviation, no nystagmus    CN V normal sensation in V1, V2, and V3 segments bilaterally    CN VII no asymmetry, no nasolabial fold flattening    CN VIII normal hearing to speech    CN IX & X normal palatal elevation, no uvular  deviation    CN XI 5/5 head turn and 5/5 shoulder shrug bilaterally    CN XII midline tongue protrusion    Motor:  Muscle bulk: poor, tone normal Mvmt Root Nerve  Muscle Right Left Comments  SA C5/6 Ax Deltoid 5 5   EF C5/6 Mc Biceps 5 5   EE C6/7/8 Rad Triceps 5 5   WF C6/7 Med FCR     WE  C7/8 PIN ECU     F Ab C8/T1 U ADM/FDI 5 5   HF L1/2/3 Fem Illopsoas 5 5   KE L2/3/4 Fem Quad 5 5   DF L4/5 D Peron Tib Ant 5 5   PF S1/2 Tibial Grc/Sol 5 5    Sensation:  Light touch Intact throughout   Pin prick    Temperature    Vibration   Proprioception    Coordination/Complex Motor:  - Finger to Nose intact BL - Heel to shin intact BL - Rapid alternating movement are normal - Gait: deferred.  Labs/Imaging/Neurodiagnostic studies   CBC:  Recent Labs  Lab 12/14/24 2212 December 14, 2024 2217  WBC 10.2  --   NEUTROABS 8.3*  --   HGB 13.3 13.3  HCT 42.8 39.0  MCV 91.6  --   PLT 495*  --    Basic Metabolic Panel:  Lab Results  Component Value Date   NA 139 12-14-2024   K 3.4 (L) 14-Dec-2024   CO2 30 09/01/2024   GLUCOSE 111 (H) December 14, 2024   BUN 20 December 14, 2024   CREATININE 1.30 (H) 2024-12-14   CALCIUM  9.4 09/01/2024   GFRNONAA 51 (L) 05/16/2024   GFRAA >60 08/09/2019   Lipid Panel:  Lab Results  Component Value Date   LDLCALC 125 (H) 09/14/2023   HgbA1c:  Lab Results  Component Value Date   HGBA1C 5.3 12/26/2016   Urine Drug Screen: No results found for: LABOPIA, COCAINSCRNUR, LABBENZ, AMPHETMU, THCU, LABBARB  Alcohol Level No results found for: [facility] INR  Lab Results  Component Value Date   INR 1.2 05/15/2024   APTT  Lab Results  Component Value Date   APTT 41 (H) 08/09/2019   AED levels: No results found for: PHENYTOIN, ZONISAMIDE, LAMOTRIGINE, LEVETIRACETA  CT Head without contrast(Personally reviewed): CTH was negative for a large hypodensity concerning for a large territory infarct or hyperdensity concerning for an ICH   ASSESSMENT    Niobe Dick is a 73 y.o. female with hx of HTN, Migraine, nephrolithiasis who presents with headache and R sided tingling and pain.  Symptoms started around 2000.  Had similar episode back in 2014 and then more recently in May of 2025 with negative extensive workup.  Unlikely for stroke to cause pain and no noted objective deficits on exam. Has had prior similar episodes with negative extensive workup. Low overall suspicion for stroke. Suspect either hemiplegic migraine or functional neurologic symptom disorder.  RECOMMENDATIONS  - headache cocktail. - no further inpatient neurologic workup. ______________________________________________________________________  Plan discussed with Nidia Mays PA with the ED team.  Signed, Iley Deignan, MD [facility]

## 2024-11-14 NOTE — Progress Notes (Deleted)
   LILLETTE Ileana Collet, PhD, LAT, ATC acting as a scribe for Artist Lloyd, MD.  Janet Sherman is a 73 y.o. female who presents to [facility] today for bilateral hip pain, L>R, ongoing since the end of Oct. Pt locates pain to ***  Radiates: Aggravates: Treatments tried:  Dx imaging: 11/03/24 Bilat hip/pelvis XR  Pertinent review of systems: ***  Relevant historical information: ***   Exam:  There were no vitals taken for this visit. General: Well Developed, well nourished, and in no acute distress.   MSK: ***    Lab and Radiology Results No results found for this or any previous visit (from the past 72 hours). No results found.     Assessment and Plan: 73 y.o. female with ***   PDMP not reviewed this encounter. No orders of the defined types were placed in this encounter.  No orders of the defined types were placed in this encounter.    Discussed warning signs or symptoms. Please see discharge instructions. Patient expresses understanding.   ***

## 2024-11-15 LAB — URINALYSIS, ROUTINE W REFLEX MICROSCOPIC
Bilirubin Urine: NEGATIVE
Glucose, UA: NEGATIVE mg/dL
Hgb urine dipstick: NEGATIVE
Ketones, ur: NEGATIVE mg/dL
Leukocytes,Ua: NEGATIVE
Nitrite: NEGATIVE
Protein, ur: NEGATIVE mg/dL
Specific Gravity, Urine: 1.012 (ref 1.005–1.030)
pH: 5 (ref 5.0–8.0)

## 2024-11-15 LAB — RAPID URINE DRUG SCREEN, HOSP PERFORMED
Amphetamines: NOT DETECTED
Barbiturates: NOT DETECTED
Benzodiazepines: NOT DETECTED
Cocaine: NOT DETECTED
Opiates: NOT DETECTED
Tetrahydrocannabinol: NOT DETECTED

## 2024-11-15 NOTE — Discharge Instructions (Addendum)
 As discussed, please follow up with primary care. Seek emergency care if experiencing any new or worsening symptoms.

## 2024-11-29 ENCOUNTER — Other Ambulatory Visit: Payer: Self-pay

## 2024-11-29 ENCOUNTER — Ambulatory Visit: Admitting: Internal Medicine

## 2024-11-29 ENCOUNTER — Ambulatory Visit: Admitting: Family Medicine

## 2024-11-29 VITALS — BP 132/86 | HR 81 | Ht 63.0 in | Wt 109.0 lb

## 2024-11-29 DIAGNOSIS — G8929 Other chronic pain: Secondary | ICD-10-CM | POA: Diagnosis not present

## 2024-11-29 DIAGNOSIS — M25511 Pain in right shoulder: Secondary | ICD-10-CM | POA: Diagnosis not present

## 2024-11-29 DIAGNOSIS — M25551 Pain in right hip: Secondary | ICD-10-CM | POA: Diagnosis not present

## 2024-11-29 DIAGNOSIS — M25552 Pain in left hip: Secondary | ICD-10-CM

## 2024-11-29 NOTE — Patient Instructions (Addendum)
 Thank you for coming in today.   Tylenol  arthritis, twice daily.  Icy Hot roll on  If we need to do an injection, let me know.
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
## 2025-01-03 ENCOUNTER — Encounter: Payer: Self-pay | Admitting: Internal Medicine

## 2025-01-04 ENCOUNTER — Other Ambulatory Visit: Payer: Self-pay | Admitting: Internal Medicine

## 2025-01-11 ENCOUNTER — Ambulatory Visit: Payer: Self-pay

## 2025-01-11 ENCOUNTER — Ambulatory Visit: Payer: Self-pay | Admitting: Family Medicine

## 2025-01-11 ENCOUNTER — Ambulatory Visit

## 2025-01-11 ENCOUNTER — Encounter: Payer: Self-pay | Admitting: Internal Medicine

## 2025-01-11 ENCOUNTER — Ambulatory Visit: Admitting: Family Medicine

## 2025-01-11 ENCOUNTER — Encounter: Payer: Self-pay | Admitting: Family Medicine

## 2025-01-11 VITALS — BP 114/82 | HR 74 | Temp 97.8°F | Ht 63.0 in | Wt 112.0 lb

## 2025-01-11 DIAGNOSIS — Z8701 Personal history of pneumonia (recurrent): Secondary | ICD-10-CM

## 2025-01-11 DIAGNOSIS — F0280 Dementia in other diseases classified elsewhere without behavioral disturbance: Secondary | ICD-10-CM | POA: Diagnosis not present

## 2025-01-11 DIAGNOSIS — R82998 Other abnormal findings in urine: Secondary | ICD-10-CM | POA: Diagnosis not present

## 2025-01-11 DIAGNOSIS — R051 Acute cough: Secondary | ICD-10-CM

## 2025-01-11 DIAGNOSIS — G309 Alzheimer's disease, unspecified: Secondary | ICD-10-CM

## 2025-01-11 DIAGNOSIS — R55 Syncope and collapse: Secondary | ICD-10-CM

## 2025-01-11 LAB — CBC WITH DIFFERENTIAL/PLATELET
Basophils Absolute: 0.1 K/uL (ref 0.0–0.1)
Basophils Relative: 0.9 % (ref 0.0–3.0)
Eosinophils Absolute: 0.5 K/uL (ref 0.0–0.7)
Eosinophils Relative: 4.9 % (ref 0.0–5.0)
HCT: 43.1 % (ref 36.0–46.0)
Hemoglobin: 14.4 g/dL (ref 12.0–15.0)
Lymphocytes Relative: 7.5 % — ABNORMAL LOW (ref 12.0–46.0)
Lymphs Abs: 0.7 K/uL (ref 0.7–4.0)
MCHC: 33.5 g/dL (ref 30.0–36.0)
MCV: 84.2 fl (ref 78.0–100.0)
Monocytes Absolute: 0.5 K/uL (ref 0.1–1.0)
Monocytes Relative: 5.4 % (ref 3.0–12.0)
Neutro Abs: 7.6 K/uL (ref 1.4–7.7)
Neutrophils Relative %: 81.3 % — ABNORMAL HIGH (ref 43.0–77.0)
Platelets: 508 K/uL — ABNORMAL HIGH (ref 150.0–400.0)
RBC: 5.12 Mil/uL — ABNORMAL HIGH (ref 3.87–5.11)
RDW: 14.3 % (ref 11.5–15.5)
WBC: 9.3 K/uL (ref 4.0–10.5)

## 2025-01-11 LAB — COMPREHENSIVE METABOLIC PANEL WITH GFR
ALT: 18 U/L (ref 3–35)
AST: 22 U/L (ref 5–37)
Albumin: 4.2 g/dL (ref 3.5–5.2)
Alkaline Phosphatase: 57 U/L (ref 39–117)
BUN: 20 mg/dL (ref 6–23)
CO2: 30 meq/L (ref 19–32)
Calcium: 9.6 mg/dL (ref 8.4–10.5)
Chloride: 103 meq/L (ref 96–112)
Creatinine, Ser: 1.1 mg/dL (ref 0.40–1.20)
GFR: 50.12 mL/min — ABNORMAL LOW
Glucose, Bld: 91 mg/dL (ref 70–99)
Potassium: 5 meq/L (ref 3.5–5.1)
Sodium: 140 meq/L (ref 135–145)
Total Bilirubin: 0.6 mg/dL (ref 0.2–1.2)
Total Protein: 7.5 g/dL (ref 6.0–8.3)

## 2025-01-11 LAB — POC URINALSYSI DIPSTICK (AUTOMATED)
Bilirubin, UA: NEGATIVE
Blood, UA: NEGATIVE
Glucose, UA: NEGATIVE
Ketones, UA: NEGATIVE
Nitrite, UA: NEGATIVE
Protein, UA: NEGATIVE
Spec Grav, UA: >=1.03 — AB
Urobilinogen, UA: 0.2 U/dL
pH, UA: 6

## 2025-01-11 LAB — TSH: TSH: 2.98 u[IU]/mL (ref 0.35–5.50)

## 2025-01-11 LAB — MAGNESIUM: Magnesium: 2 mg/dL (ref 1.5–2.5)

## 2025-01-11 MED ORDER — AMOXICILLIN-POT CLAVULANATE 875-125 MG PO TABS: 1.0000 | ORAL_TABLET | Freq: Two times a day (BID) | ORAL | 0 refills | Status: AC

## 2025-01-11 NOTE — Telephone Encounter (Signed)
 FYI Only or Action Required?: Action required by provider: request for appointment.  Patient was last seen in primary care on 11/03/2024 by Janet Sherman PARAS, MD.  Called Nurse Triage reporting Cough.  Symptoms began several weeks ago.  Interventions attempted: Nothing.  Symptoms are: gradually worsening. Daughter reports cough x several weeks. Concerned she could have pneumonia.   Triage Disposition: See Physician Within 24 Hours  Patient/caregiver understands and will follow disposition?: Yes      Copied from CRM #[id number]. Topic: Clinical - Red Word Triage >> Jan 11, 2025  9:29 AM Wess RAMAN wrote: Red Word that prompted transfer to Nurse Triage: atient's daughter, Janet Sherman, would like to know if Dr. Garald could put in a chest xray for patient due to cough and congestion and worsening conditions. Reason for Disposition  [1] Continuous (nonstop) coughing interferes with work or school AND [2] no improvement using cough treatment per Care Advice  Answer Assessment - Initial Assessment Questions 1. ONSET: When did the cough begin?      weeks 2. SEVERITY: How bad is the cough today?      severe 3. SPUTUM: Describe the color of your sputum (e.g., none, dry cough; clear, white, yellow, green)     no 4. HEMOPTYSIS: Are you coughing up any blood? If Yes, ask: How much? (e.g., flecks, streaks, tablespoons, etc.)     no 5. DIFFICULTY BREATHING: Are you having difficulty breathing? If Yes, ask: How bad is it? (e.g., mild, moderate, severe)      no 6. FEVER: Do you have a fever? If Yes, ask: What is your temperature, how was it measured, and when did it start?     no 7. CARDIAC HISTORY: Do you have any history of heart disease? (e.g., heart attack, congestive heart failure)      no 8. LUNG HISTORY: Do you have any history of lung disease?  (e.g., pulmonary embolus, asthma, emphysema)     no 9. PE RISK FACTORS: Do you have a history of blood clots? (or: recent  major surgery, recent prolonged travel, bedridden)     no 10. OTHER SYMPTOMS: Do you have any other symptoms? (e.g., runny nose, wheezing, chest pain)       no 11. PREGNANCY: Is there any chance you are pregnant? When was your last menstrual period?       no 12. TRAVEL: Have you traveled out of the country in the last month? (e.g., travel history, exposures)       no  Protocols used: Cough - Acute Productive-A-AH

## 2025-01-11 NOTE — Progress Notes (Signed)
 "  Subjective:     Patient ID: Janet Sherman, female    DOB: [birth year], 74 y.o.   MRN: [id number]  Chief Complaint  Patient presents with   Cough    Cough present for months, over the last couple weeks it has been worsening.   Syncope episode this morning around 1:30 am, told them she felt she was going to pass out and then did. Hx of not really eating     Cough Pertinent negatives include no chest pain, chills, fever, headaches or shortness of breath.    Discussed the use of AI scribe software for clinical note transcription with the patient, who gave verbal consent to proceed.  History of Present Illness Janet Sherman is a 74 year old female who presents with a syncopal episode. She is accompanied by her daughters and her 51 year old grandson.  Syncope and associated symptoms - Syncopal episode earlier today, slid down a wall without head trauma - Preceding symptoms included lightheadedness and itching on her back - No hives or visible allergic reaction - First true loss of consciousness; prior near-syncopal episodes when dehydrated - Confusion and intermittent visual disturbance - No chest pain, abdominal pain, or urinary symptoms - Soreness in her arm and occasional mild chest soreness which is not new and has been evaluated  Respiratory symptoms - Cough initially dry, now productive of mucus  Oral intake - Recent oral intake inconsistent - Minimal intake yesterday - After episode today, consumed peanut butter sandwich, soup, and water  Dermatologic symptoms - History of shingles - Current itching of back - No new topical or other products started     Health Maintenance Due  Topic Date Due   Hepatitis C Screening  Never done   COVID-19 Vaccine (2 - Pfizer risk series) 04/26/2020   Mammogram  05/18/2021    Past Medical History:  Diagnosis Date   Arthritis    right hand (01/22/2017)   Gallstones    History of kidney stones    Hypertension    Migraine     a few/year (01/22/2017)   Pneumonia X 1    Past Surgical History:  Procedure Laterality Date   CRANIOTOMY N/A 10/16/2016   Procedure: Pituitary tumor (benign) resection - CRANIOTOMY HYPOPHYSECTOMY TRANSNASAL APPROACH;  Surgeon: Gerldine Maizes, MD;  Location: [facility] OR;  Service: Neurosurgery;  Laterality: N/A;   ELECTROPHYSIOLOGIC STUDY N/A 01/22/2017   Procedure: SVT Ablation;  Surgeon: Will Gladis Norton, MD;  Location: [facility] INVASIVE [facility];  Service: Cardiovascular;  Laterality: N/A;   ELECTROPHYSIOLOGIC STUDY N/A 01/22/2017   Procedure: Electrophysiology Study;  Surgeon: Will Gladis Norton, MD;  Location: [facility];  Service: Cardiovascular;  Laterality: N/A;   gallstones  ~ 2000   KIDNEY STONE SURGERY     cut me open   SUPRAVENTRICULAR TACHYCARDIA ABLATION  01/22/2017   TRANSNASAL APPROACH N/A 10/16/2016   Procedure: TRANSNASAL APPROACH WITH FUSION;  Surgeon: Alm Bouche, MD;  Location: [facility] OR;  Service: ENT;  Laterality: N/A;   TUBAL LIGATION      Family History  Problem Relation Age of Onset   Hypertension Mother    Cancer Father        lung   Hypertension Sister    Hypertension Brother     Social History   Socioeconomic History   Marital status: Widowed    Spouse name: Janet Sherman   Number of children: 5   Years of education: 10   Highest education level: Not on file  Occupational History  Employer: [employer]    Comment: [employer]  Tobacco Use   Smoking status: Former    Current packs/day: 0.00    Types: Cigarettes    Quit date: 10/16/2016    Years since quitting: 8.2   Smokeless tobacco: Never  Vaping Use   Vaping status: Never Used  Substance and Sexual Activity   Alcohol use: No   Drug use: No   Sexual activity: Not Currently  Other Topics Concern   Not on file  Social History Narrative   Patient is married(James) and lives at home with her husband.   Patient has 5 children.   Patient works full-time.   Patient  has a 10th  Grade education.   Patient is right handed.   Patient drinks 2-3 cups of coffee daily.   Social Drivers of Health   Tobacco Use: Medium Risk (01/11/2025)   Patient History    Smoking Tobacco Use: Former    Smokeless Tobacco Use: Never    Passive Exposure: Not on file  Financial Resource Strain: Low Risk (09/22/2024)   Overall Financial Resource Strain (CARDIA)    Difficulty of Paying Living Expenses: Not hard at all  Food Insecurity: No Food Insecurity (09/22/2024)   Epic    Worried About Programme Researcher, Broadcasting/film/video in the Last Year: Never true    Ran Out of Food in the Last Year: Never true  Transportation Needs: No Transportation Needs (09/22/2024)   Epic    Lack of Transportation (Medical): No    Lack of Transportation (Non-Medical): No  Physical Activity: Inactive (09/22/2024)   Exercise Vital Sign    Days of Exercise per Week: 0 days    Minutes of Exercise per Session: 0 min  Stress: No Stress Concern Present (09/22/2024)   [employer] of Occupational Health - Occupational Stress Questionnaire    Feeling of Stress: Not at all  Social Connections: Moderately Integrated (09/22/2024)   Social Connection and Isolation Panel    Frequency of Communication with Friends and Family: More than three times a week    Frequency of Social Gatherings with Friends and Family: Twice a week    Attends Religious Services: More than 4 times per year    Active Member of [employer] or Organizations: Yes    Attends Banker Meetings: Never    Marital Status: Widowed  Intimate Partner Violence: Not At Risk (09/22/2024)   Epic    Fear of Current or Ex-Partner: No    Emotionally Abused: No    Physically Abused: No    Sexually Abused: No  Depression (PHQ2-9): Low Risk (11/03/2024)   Depression (PHQ2-9)    PHQ-2 Score: 0  Alcohol Screen: Low Risk (09/22/2024)   Alcohol Screen    Last Alcohol Screening Score (AUDIT): 0  Housing: Unknown (09/22/2024)   Epic    Unable to Pay for  Housing in the Last Year: No    Number of Times Moved in the Last Year: Not on file    Homeless in the Last Year: No  Utilities: Not At Risk (09/22/2024)   Epic    Threatened with loss of utilities: No  Health Literacy: Inadequate Health Literacy (09/22/2024)   B1300 Health Literacy    Frequency of need for help with medical instructions: Always    Outpatient Medications Prior to Visit  Medication Sig Dispense Refill   aspirin  EC 81 MG tablet Take 81 mg by mouth daily. Swallow whole.     B Complex-C (B-COMPLEX WITH  VITAMIN C) tablet Take 1 tablet by mouth daily. 100 tablet 3   Cholecalciferol (VITAMIN D3) 50 MCG (2000 UT) capsule Take 1 capsule (2,000 Units total) by mouth daily. 100 capsule 3   donepezil  (ARICEPT ) 10 MG tablet TAKE 1 TABLET BY MOUTH AT BEDTIME 90 tablet 0   hydroxyurea  (HYDREA ) 500 MG capsule Take 1 capsule (500 mg total) by mouth as directed. May take with food to minimize GI side effects. Take 1 pill daily Monday Through Friday, do not take on Saturday/Sunday 90 capsule 1   fluticasone  (FLONASE ) 50 MCG/ACT nasal spray Place 2 sprays into both nostrils daily. (Patient not taking: Reported on 01/11/2025) 16 g 10   cefdinir  (OMNICEF ) 300 MG capsule Take 1 capsule (300 mg total) by mouth 2 (two) times daily. (Patient not taking: Reported on 11/29/2024) 20 capsule 0   No facility-administered medications prior to visit.    Allergies[1]  Review of Systems  Constitutional:  Negative for chills and fever.  Respiratory:  Positive for cough and sputum production. Negative for shortness of breath.   Cardiovascular:  Negative for chest pain, palpitations and leg swelling.  Gastrointestinal:  Negative for abdominal pain, constipation, diarrhea, nausea and vomiting.  Genitourinary:  Negative for dysuria, frequency and urgency.  Musculoskeletal:  Positive for falls and joint pain.  Skin:  Positive for itching.  Neurological:  Positive for dizziness and loss of consciousness.  Negative for focal weakness and headaches.       Objective:    Physical Exam Constitutional:      General: She is not in acute distress.    Appearance: She is not ill-appearing.     Comments: Frail   HENT:     Head: Atraumatic.     Right Ear: External ear normal.     Left Ear: External ear normal.     Nose: Nose normal.  Eyes:     Extraocular Movements: Extraocular movements intact.     Conjunctiva/sclera: Conjunctivae normal.     Pupils: Pupils are equal, round, and reactive to light.  Cardiovascular:     Rate and Rhythm: Normal rate and regular rhythm.  Pulmonary:     Effort: Pulmonary effort is normal.     Breath sounds: Normal breath sounds.  Abdominal:     General: Bowel sounds are normal. There is no distension.     Palpations: Abdomen is soft.     Tenderness: There is no abdominal tenderness. There is no guarding or rebound.  Musculoskeletal:     Cervical back: Normal range of motion and neck supple. No tenderness.     Right lower leg: No edema.     Left lower leg: No edema.  Lymphadenopathy:     Cervical: No cervical adenopathy.  Skin:    General: Skin is warm and dry.     Findings: Rash present.     Comments: Red pruritic rash on back  Neurological:     General: No focal deficit present.     Mental Status: She is alert and oriented to person, place, and time.     Sensory: No sensory deficit.     Motor: No weakness.     Coordination: Coordination normal.     Gait: Gait normal.  Psychiatric:        Mood and Affect: Mood normal.        Behavior: Behavior normal.        Thought Content: Thought content normal.      BP 114/82   Pulse 74  Temp 97.8 F (36.6 C) (Temporal)   Ht 5' 3 (1.6 m)   Wt 112 lb (50.8 kg)   SpO2 98%   BMI 19.84 kg/m  Wt Readings from Last 3 Encounters:  01/11/25 112 lb (50.8 kg)  11/29/24 109 lb (49.4 kg)  11/14/24 109 lb (49.4 kg)       Assessment & Plan:   Problem List Items Addressed This Visit   None Visit  Diagnoses       Syncope, unspecified syncope type    -  Primary   Relevant Orders   POCT Urinalysis Dipstick (Automated) (Completed)   EKG 12-Lead   CBC with Differential/Platelet (Completed)   Comprehensive metabolic panel with GFR (Completed)   TSH (Completed)   Magnesium (Completed)   DG Chest 2 View (Completed)     Acute cough       Relevant Orders   DG Chest 2 View (Completed)     History of pneumonia       Relevant Orders   DG Chest 2 View (Completed)     Alzheimer's dementia, unspecified dementia severity, unspecified timing of dementia onset, unspecified whether behavioral, psychotic, or mood disturbance or anxiety (HCC)         Leukocytes in urine       Relevant Orders   Urine Culture       Assessment and Plan Assessment & Plan Syncope Acute syncopal episode this morning without head injury. Differential includes arrhythmia, hypotension, hypoglycemia, or dehydration. EKG shows no acute changes. Urinalysis indicates dehydration. No seizure history.  - Ordered chest x-ray to rule out pneumonia - Ordered blood work including CBC, electrolytes, renal function, TSH and magnesium - Advised to call 911 if another syncopal episode occurs for immediate EKG and blood pressure monitoring - Encouraged increased fluid intake  EKG NSR, rate 72, no acute changes compared to EKG from 10/2024  Acute cough Cough initially dry, now productive with mucus. No chest pain or shortness of breath reported. - Ordered chest x-ray to rule out pneumonia  Alzheimer's disease Increased confusion noted. Currently on donepezil . - Continue donepezil  - Will consider palliative care consult for additional support. Daughter plans to follow up with PCP regarding this   Pruritus Back, possibly due to dry skin or irritation from clothing. No signs of shingles or allergic reaction. - Apply hydrocortisone  cream to affected area - Use lotion for skin hydration - Consider Claritin  or Zyrtec for itching  if needed  Abnormal urinalysis Urinalysis shows 1+ leukocytes and specific gravity of 1.030, indicating possible dehydration and potential urinary tract infection. - Sent urine for culture - Encouraged increased fluid intake - Will treat with antibiotics if culture is positive  Possible recurrent pneumonia on X ray - Augment prescribed    I have discontinued Sharne Glasco's cefdinir . I am also having her start on amoxicillin -clavulanate. Additionally, I am having her maintain her hydroxyurea , aspirin  EC, fluticasone , B-complex with vitamin C, Vitamin D3, and donepezil .  Meds ordered this encounter  Medications   amoxicillin -clavulanate (AUGMENTIN ) 875-125 MG tablet    Sig: Take 1 tablet by mouth 2 (two) times daily.    Dispense:  20 tablet    Refill:  0    Supervising Provider:   ROLLENE NORRIS A [[id number]]       [1]  Allergies Allergen Reactions   Mold Extract [Trichophyton] Shortness Of Breath and Other (See Comments)    Wheezing, itchy eyes, and runny nose, also   Pollen Extract Shortness Of Breath and Other (See  Comments)    Wheezing, itchy eyes, and runny nose, also   "

## 2025-01-11 NOTE — Progress Notes (Signed)
 Please let her daughter know about her chest x-ray shows possible pneumonia.  I will send an antibiotic to the pharmacy to treat this just in case.  It was recommended that she have a recheck x-ray in 4 to 6 weeks.  When she follows up with Dr. Garald, he can order this.

## 2025-01-11 NOTE — Telephone Encounter (Signed)
 Copied from CRM #[id number]. Topic: Clinical - Request for Lab/Test Order >> Jan 11, 2025  9:27 AM Wess RAMAN wrote: Reason for CRM: Patient's daughter, Bobbette, would like to know if Dr. Garald could put in a chest xray for patient due to cough and congestion and worsening conditions. Transferred to Triage nurse as well.

## 2025-01-11 NOTE — Patient Instructions (Signed)
 Please go downstairs for labs and a chest x-ray before you leave.  If you pass out again or feel like you are going to pass out, please call 911 for immediate evaluation.    I will be in touch with your results and with recommendations.

## 2025-01-12 LAB — URINE CULTURE: Result:: NO GROWTH

## 2025-02-12 ENCOUNTER — Ambulatory Visit: Admitting: Internal Medicine

## 2025-09-28 ENCOUNTER — Ambulatory Visit
